# Patient Record
Sex: Male | Born: 1940
Health system: Southern US, Community
[De-identification: ages and names within clinical notes are randomized; demographics above are authoritative.]

## PROBLEM LIST (undated history)

## (undated) DIAGNOSIS — I4891 Unspecified atrial fibrillation: Secondary | ICD-10-CM

## (undated) DIAGNOSIS — E119 Type 2 diabetes mellitus without complications: Secondary | ICD-10-CM

## (undated) DIAGNOSIS — K219 Gastro-esophageal reflux disease without esophagitis: Secondary | ICD-10-CM

## (undated) DIAGNOSIS — E785 Hyperlipidemia, unspecified: Secondary | ICD-10-CM

## (undated) DIAGNOSIS — I639 Cerebral infarction, unspecified: Secondary | ICD-10-CM

## (undated) DIAGNOSIS — K279 Peptic ulcer, site unspecified, unspecified as acute or chronic, without hemorrhage or perforation: Secondary | ICD-10-CM

## (undated) HISTORY — DX: Unspecified atrial fibrillation: I48.91

## (undated) HISTORY — DX: Peptic ulcer, site unspecified, unspecified as acute or chronic, without hemorrhage or perforation: K27.9

## (undated) HISTORY — PX: CATARACT EXTRACTION W/ INTRAOCULAR LENS IMPLANT: SHX1309

## (undated) HISTORY — DX: Gastro-esophageal reflux disease without esophagitis: K21.9

## (undated) HISTORY — DX: Type 2 diabetes mellitus without complications: E11.9

## (undated) HISTORY — DX: Hyperlipidemia, unspecified: E78.5

## (undated) HISTORY — DX: Cerebral infarction, unspecified: I63.9

---

## 2007-01-07 DIAGNOSIS — M109 Gout, unspecified: Secondary | ICD-10-CM | POA: Insufficient documentation

## 2007-01-07 DIAGNOSIS — I10 Essential (primary) hypertension: Secondary | ICD-10-CM | POA: Insufficient documentation

## 2007-01-07 DIAGNOSIS — J309 Allergic rhinitis, unspecified: Secondary | ICD-10-CM | POA: Insufficient documentation

## 2007-07-19 DIAGNOSIS — G4730 Sleep apnea, unspecified: Secondary | ICD-10-CM | POA: Insufficient documentation

## 2008-05-20 DIAGNOSIS — E1169 Type 2 diabetes mellitus with other specified complication: Secondary | ICD-10-CM | POA: Insufficient documentation

## 2008-05-20 DIAGNOSIS — E785 Hyperlipidemia, unspecified: Secondary | ICD-10-CM | POA: Insufficient documentation

## 2008-08-14 DIAGNOSIS — E1165 Type 2 diabetes mellitus with hyperglycemia: Secondary | ICD-10-CM | POA: Insufficient documentation

## 2008-08-14 DIAGNOSIS — E118 Type 2 diabetes mellitus with unspecified complications: Secondary | ICD-10-CM | POA: Insufficient documentation

## 2008-08-14 DIAGNOSIS — IMO0002 Reserved for concepts with insufficient information to code with codable children: Secondary | ICD-10-CM | POA: Insufficient documentation

## 2016-05-04 DIAGNOSIS — Z0289 Encounter for other administrative examinations: Secondary | ICD-10-CM | POA: Diagnosis not present

## 2016-05-04 DIAGNOSIS — Z024 Encounter for examination for driving license: Secondary | ICD-10-CM | POA: Diagnosis not present

## 2016-07-08 DIAGNOSIS — I440 Atrioventricular block, first degree: Secondary | ICD-10-CM | POA: Diagnosis present

## 2016-07-08 DIAGNOSIS — I6789 Other cerebrovascular disease: Secondary | ICD-10-CM | POA: Diagnosis not present

## 2016-07-08 DIAGNOSIS — R4781 Slurred speech: Secondary | ICD-10-CM | POA: Diagnosis not present

## 2016-07-08 DIAGNOSIS — G4733 Obstructive sleep apnea (adult) (pediatric): Secondary | ICD-10-CM | POA: Diagnosis present

## 2016-07-08 DIAGNOSIS — G8191 Hemiplegia, unspecified affecting right dominant side: Secondary | ICD-10-CM | POA: Diagnosis not present

## 2016-07-08 DIAGNOSIS — I638 Other cerebral infarction: Secondary | ICD-10-CM | POA: Diagnosis not present

## 2016-07-08 DIAGNOSIS — Z8673 Personal history of transient ischemic attack (TIA), and cerebral infarction without residual deficits: Secondary | ICD-10-CM | POA: Diagnosis not present

## 2016-07-08 DIAGNOSIS — Z7984 Long term (current) use of oral hypoglycemic drugs: Secondary | ICD-10-CM | POA: Diagnosis not present

## 2016-07-08 DIAGNOSIS — I700 Atherosclerosis of aorta: Secondary | ICD-10-CM | POA: Diagnosis not present

## 2016-07-08 DIAGNOSIS — R2990 Unspecified symptoms and signs involving the nervous system: Secondary | ICD-10-CM | POA: Diagnosis not present

## 2016-07-08 DIAGNOSIS — I6322 Cerebral infarction due to unspecified occlusion or stenosis of basilar arteries: Secondary | ICD-10-CM | POA: Diagnosis not present

## 2016-07-08 DIAGNOSIS — H532 Diplopia: Secondary | ICD-10-CM | POA: Diagnosis present

## 2016-07-08 DIAGNOSIS — I63212 Cerebral infarction due to unspecified occlusion or stenosis of left vertebral arteries: Secondary | ICD-10-CM | POA: Diagnosis not present

## 2016-07-08 DIAGNOSIS — R29703 NIHSS score 3: Secondary | ICD-10-CM | POA: Diagnosis present

## 2016-07-08 DIAGNOSIS — R471 Dysarthria and anarthria: Secondary | ICD-10-CM | POA: Diagnosis present

## 2016-07-08 DIAGNOSIS — I513 Intracardiac thrombosis, not elsewhere classified: Secondary | ICD-10-CM | POA: Diagnosis not present

## 2016-07-08 DIAGNOSIS — I519 Heart disease, unspecified: Secondary | ICD-10-CM | POA: Diagnosis not present

## 2016-07-08 DIAGNOSIS — I6623 Occlusion and stenosis of bilateral posterior cerebral arteries: Secondary | ICD-10-CM | POA: Diagnosis not present

## 2016-07-08 DIAGNOSIS — H5347 Heteronymous bilateral field defects: Secondary | ICD-10-CM | POA: Diagnosis present

## 2016-07-08 DIAGNOSIS — H5122 Internuclear ophthalmoplegia, left eye: Secondary | ICD-10-CM | POA: Diagnosis not present

## 2016-07-08 DIAGNOSIS — I639 Cerebral infarction, unspecified: Secondary | ICD-10-CM | POA: Diagnosis not present

## 2016-07-08 DIAGNOSIS — I10 Essential (primary) hypertension: Secondary | ICD-10-CM | POA: Diagnosis not present

## 2016-07-08 DIAGNOSIS — G9389 Other specified disorders of brain: Secondary | ICD-10-CM | POA: Diagnosis not present

## 2016-07-08 DIAGNOSIS — H510 Palsy (spasm) of conjugate gaze: Secondary | ICD-10-CM | POA: Diagnosis present

## 2016-07-08 DIAGNOSIS — Z7982 Long term (current) use of aspirin: Secondary | ICD-10-CM | POA: Diagnosis not present

## 2016-07-08 DIAGNOSIS — N400 Enlarged prostate without lower urinary tract symptoms: Secondary | ICD-10-CM | POA: Diagnosis not present

## 2016-07-08 DIAGNOSIS — I6502 Occlusion and stenosis of left vertebral artery: Secondary | ICD-10-CM | POA: Diagnosis not present

## 2016-07-08 DIAGNOSIS — E119 Type 2 diabetes mellitus without complications: Secondary | ICD-10-CM | POA: Diagnosis not present

## 2016-07-08 DIAGNOSIS — I6612 Occlusion and stenosis of left anterior cerebral artery: Secondary | ICD-10-CM | POA: Diagnosis not present

## 2016-07-08 DIAGNOSIS — I6340 Cerebral infarction due to embolism of unspecified cerebral artery: Secondary | ICD-10-CM | POA: Diagnosis not present

## 2016-07-08 DIAGNOSIS — I2510 Atherosclerotic heart disease of native coronary artery without angina pectoris: Secondary | ICD-10-CM | POA: Diagnosis not present

## 2016-07-13 DIAGNOSIS — E1165 Type 2 diabetes mellitus with hyperglycemia: Secondary | ICD-10-CM | POA: Diagnosis not present

## 2016-07-13 DIAGNOSIS — G463 Brain stem stroke syndrome: Secondary | ICD-10-CM | POA: Diagnosis not present

## 2016-07-13 DIAGNOSIS — I2510 Atherosclerotic heart disease of native coronary artery without angina pectoris: Secondary | ICD-10-CM | POA: Diagnosis not present

## 2016-07-13 DIAGNOSIS — I639 Cerebral infarction, unspecified: Secondary | ICD-10-CM | POA: Diagnosis not present

## 2016-07-13 DIAGNOSIS — Z7901 Long term (current) use of anticoagulants: Secondary | ICD-10-CM | POA: Diagnosis not present

## 2016-07-13 DIAGNOSIS — Z79899 Other long term (current) drug therapy: Secondary | ICD-10-CM | POA: Diagnosis not present

## 2016-07-13 DIAGNOSIS — E785 Hyperlipidemia, unspecified: Secondary | ICD-10-CM | POA: Diagnosis not present

## 2016-07-15 DIAGNOSIS — Z7901 Long term (current) use of anticoagulants: Secondary | ICD-10-CM | POA: Diagnosis not present

## 2016-07-16 DIAGNOSIS — M1991 Primary osteoarthritis, unspecified site: Secondary | ICD-10-CM | POA: Diagnosis not present

## 2016-07-16 DIAGNOSIS — G4730 Sleep apnea, unspecified: Secondary | ICD-10-CM | POA: Diagnosis not present

## 2016-07-16 DIAGNOSIS — Z7901 Long term (current) use of anticoagulants: Secondary | ICD-10-CM | POA: Diagnosis not present

## 2016-07-16 DIAGNOSIS — G463 Brain stem stroke syndrome: Secondary | ICD-10-CM | POA: Diagnosis not present

## 2016-07-16 DIAGNOSIS — E785 Hyperlipidemia, unspecified: Secondary | ICD-10-CM | POA: Diagnosis not present

## 2016-07-16 DIAGNOSIS — R2681 Unsteadiness on feet: Secondary | ICD-10-CM | POA: Diagnosis not present

## 2016-07-16 DIAGNOSIS — I69391 Dysphagia following cerebral infarction: Secondary | ICD-10-CM | POA: Diagnosis not present

## 2016-07-16 DIAGNOSIS — I69951 Hemiplegia and hemiparesis following unspecified cerebrovascular disease affecting right dominant side: Secondary | ICD-10-CM | POA: Diagnosis not present

## 2016-07-16 DIAGNOSIS — N401 Enlarged prostate with lower urinary tract symptoms: Secondary | ICD-10-CM | POA: Diagnosis not present

## 2016-07-16 DIAGNOSIS — E1140 Type 2 diabetes mellitus with diabetic neuropathy, unspecified: Secondary | ICD-10-CM | POA: Diagnosis not present

## 2016-07-16 DIAGNOSIS — I69322 Dysarthria following cerebral infarction: Secondary | ICD-10-CM | POA: Diagnosis not present

## 2016-07-16 DIAGNOSIS — I69398 Other sequelae of cerebral infarction: Secondary | ICD-10-CM | POA: Diagnosis not present

## 2016-07-16 DIAGNOSIS — I2510 Atherosclerotic heart disease of native coronary artery without angina pectoris: Secondary | ICD-10-CM | POA: Diagnosis not present

## 2016-07-16 DIAGNOSIS — N138 Other obstructive and reflux uropathy: Secondary | ICD-10-CM | POA: Diagnosis not present

## 2016-07-16 DIAGNOSIS — H532 Diplopia: Secondary | ICD-10-CM | POA: Diagnosis not present

## 2016-07-16 DIAGNOSIS — Z7984 Long term (current) use of oral hypoglycemic drugs: Secondary | ICD-10-CM | POA: Diagnosis not present

## 2016-07-18 DIAGNOSIS — Z7901 Long term (current) use of anticoagulants: Secondary | ICD-10-CM | POA: Diagnosis not present

## 2016-07-19 DIAGNOSIS — I69322 Dysarthria following cerebral infarction: Secondary | ICD-10-CM | POA: Diagnosis not present

## 2016-07-19 DIAGNOSIS — H532 Diplopia: Secondary | ICD-10-CM | POA: Diagnosis not present

## 2016-07-19 DIAGNOSIS — R2681 Unsteadiness on feet: Secondary | ICD-10-CM | POA: Diagnosis not present

## 2016-07-19 DIAGNOSIS — E1140 Type 2 diabetes mellitus with diabetic neuropathy, unspecified: Secondary | ICD-10-CM | POA: Diagnosis not present

## 2016-07-19 DIAGNOSIS — G463 Brain stem stroke syndrome: Secondary | ICD-10-CM | POA: Diagnosis not present

## 2016-07-19 DIAGNOSIS — I69398 Other sequelae of cerebral infarction: Secondary | ICD-10-CM | POA: Diagnosis not present

## 2016-07-20 DIAGNOSIS — E1140 Type 2 diabetes mellitus with diabetic neuropathy, unspecified: Secondary | ICD-10-CM | POA: Diagnosis not present

## 2016-07-20 DIAGNOSIS — H532 Diplopia: Secondary | ICD-10-CM | POA: Diagnosis not present

## 2016-07-20 DIAGNOSIS — I69398 Other sequelae of cerebral infarction: Secondary | ICD-10-CM | POA: Diagnosis not present

## 2016-07-20 DIAGNOSIS — I69322 Dysarthria following cerebral infarction: Secondary | ICD-10-CM | POA: Diagnosis not present

## 2016-07-20 DIAGNOSIS — G463 Brain stem stroke syndrome: Secondary | ICD-10-CM | POA: Diagnosis not present

## 2016-07-20 DIAGNOSIS — R2681 Unsteadiness on feet: Secondary | ICD-10-CM | POA: Diagnosis not present

## 2016-07-22 DIAGNOSIS — R002 Palpitations: Secondary | ICD-10-CM | POA: Diagnosis not present

## 2016-07-22 DIAGNOSIS — G463 Brain stem stroke syndrome: Secondary | ICD-10-CM | POA: Diagnosis not present

## 2016-07-22 DIAGNOSIS — I639 Cerebral infarction, unspecified: Secondary | ICD-10-CM | POA: Diagnosis not present

## 2016-07-22 DIAGNOSIS — I69322 Dysarthria following cerebral infarction: Secondary | ICD-10-CM | POA: Diagnosis not present

## 2016-07-22 DIAGNOSIS — I69398 Other sequelae of cerebral infarction: Secondary | ICD-10-CM | POA: Diagnosis not present

## 2016-07-22 DIAGNOSIS — G4739 Other sleep apnea: Secondary | ICD-10-CM | POA: Diagnosis not present

## 2016-07-22 DIAGNOSIS — H532 Diplopia: Secondary | ICD-10-CM | POA: Diagnosis not present

## 2016-07-22 DIAGNOSIS — R2681 Unsteadiness on feet: Secondary | ICD-10-CM | POA: Diagnosis not present

## 2016-07-22 DIAGNOSIS — E1140 Type 2 diabetes mellitus with diabetic neuropathy, unspecified: Secondary | ICD-10-CM | POA: Diagnosis not present

## 2016-07-22 DIAGNOSIS — E784 Other hyperlipidemia: Secondary | ICD-10-CM | POA: Diagnosis not present

## 2016-07-23 DIAGNOSIS — E1140 Type 2 diabetes mellitus with diabetic neuropathy, unspecified: Secondary | ICD-10-CM | POA: Diagnosis not present

## 2016-07-23 DIAGNOSIS — R2681 Unsteadiness on feet: Secondary | ICD-10-CM | POA: Diagnosis not present

## 2016-07-23 DIAGNOSIS — G463 Brain stem stroke syndrome: Secondary | ICD-10-CM | POA: Diagnosis not present

## 2016-07-23 DIAGNOSIS — H532 Diplopia: Secondary | ICD-10-CM | POA: Diagnosis not present

## 2016-07-23 DIAGNOSIS — I69322 Dysarthria following cerebral infarction: Secondary | ICD-10-CM | POA: Diagnosis not present

## 2016-07-23 DIAGNOSIS — I69398 Other sequelae of cerebral infarction: Secondary | ICD-10-CM | POA: Diagnosis not present

## 2016-07-26 DIAGNOSIS — I639 Cerebral infarction, unspecified: Secondary | ICD-10-CM | POA: Diagnosis not present

## 2016-07-26 DIAGNOSIS — E785 Hyperlipidemia, unspecified: Secondary | ICD-10-CM | POA: Diagnosis not present

## 2016-07-26 DIAGNOSIS — I679 Cerebrovascular disease, unspecified: Secondary | ICD-10-CM | POA: Diagnosis not present

## 2016-07-27 DIAGNOSIS — G463 Brain stem stroke syndrome: Secondary | ICD-10-CM | POA: Diagnosis not present

## 2016-07-27 DIAGNOSIS — E1140 Type 2 diabetes mellitus with diabetic neuropathy, unspecified: Secondary | ICD-10-CM | POA: Diagnosis not present

## 2016-07-27 DIAGNOSIS — R2681 Unsteadiness on feet: Secondary | ICD-10-CM | POA: Diagnosis not present

## 2016-07-27 DIAGNOSIS — H532 Diplopia: Secondary | ICD-10-CM | POA: Diagnosis not present

## 2016-07-27 DIAGNOSIS — I69322 Dysarthria following cerebral infarction: Secondary | ICD-10-CM | POA: Diagnosis not present

## 2016-07-27 DIAGNOSIS — I69398 Other sequelae of cerebral infarction: Secondary | ICD-10-CM | POA: Diagnosis not present

## 2016-08-01 DIAGNOSIS — G4733 Obstructive sleep apnea (adult) (pediatric): Secondary | ICD-10-CM | POA: Diagnosis not present

## 2016-08-01 DIAGNOSIS — E785 Hyperlipidemia, unspecified: Secondary | ICD-10-CM | POA: Diagnosis not present

## 2016-08-01 DIAGNOSIS — E119 Type 2 diabetes mellitus without complications: Secondary | ICD-10-CM | POA: Diagnosis not present

## 2016-08-01 DIAGNOSIS — Z7901 Long term (current) use of anticoagulants: Secondary | ICD-10-CM | POA: Diagnosis not present

## 2016-08-01 DIAGNOSIS — Z79899 Other long term (current) drug therapy: Secondary | ICD-10-CM | POA: Diagnosis not present

## 2016-08-01 DIAGNOSIS — Z7984 Long term (current) use of oral hypoglycemic drugs: Secondary | ICD-10-CM | POA: Diagnosis not present

## 2016-08-01 DIAGNOSIS — E784 Other hyperlipidemia: Secondary | ICD-10-CM | POA: Diagnosis not present

## 2016-08-01 DIAGNOSIS — E1165 Type 2 diabetes mellitus with hyperglycemia: Secondary | ICD-10-CM | POA: Diagnosis not present

## 2016-08-02 DIAGNOSIS — G4733 Obstructive sleep apnea (adult) (pediatric): Secondary | ICD-10-CM | POA: Diagnosis not present

## 2016-08-02 DIAGNOSIS — I639 Cerebral infarction, unspecified: Secondary | ICD-10-CM | POA: Diagnosis not present

## 2016-08-04 DIAGNOSIS — G463 Brain stem stroke syndrome: Secondary | ICD-10-CM | POA: Diagnosis not present

## 2016-08-04 DIAGNOSIS — R2681 Unsteadiness on feet: Secondary | ICD-10-CM | POA: Diagnosis not present

## 2016-08-04 DIAGNOSIS — I69322 Dysarthria following cerebral infarction: Secondary | ICD-10-CM | POA: Diagnosis not present

## 2016-08-04 DIAGNOSIS — E1140 Type 2 diabetes mellitus with diabetic neuropathy, unspecified: Secondary | ICD-10-CM | POA: Diagnosis not present

## 2016-08-04 DIAGNOSIS — I69398 Other sequelae of cerebral infarction: Secondary | ICD-10-CM | POA: Diagnosis not present

## 2016-08-04 DIAGNOSIS — H532 Diplopia: Secondary | ICD-10-CM | POA: Diagnosis not present

## 2016-08-08 DIAGNOSIS — Z7901 Long term (current) use of anticoagulants: Secondary | ICD-10-CM | POA: Diagnosis not present

## 2016-08-08 DIAGNOSIS — R002 Palpitations: Secondary | ICD-10-CM | POA: Diagnosis not present

## 2016-08-10 DIAGNOSIS — E1140 Type 2 diabetes mellitus with diabetic neuropathy, unspecified: Secondary | ICD-10-CM | POA: Diagnosis not present

## 2016-08-10 DIAGNOSIS — R2681 Unsteadiness on feet: Secondary | ICD-10-CM | POA: Diagnosis not present

## 2016-08-10 DIAGNOSIS — H532 Diplopia: Secondary | ICD-10-CM | POA: Diagnosis not present

## 2016-08-10 DIAGNOSIS — I69322 Dysarthria following cerebral infarction: Secondary | ICD-10-CM | POA: Diagnosis not present

## 2016-08-10 DIAGNOSIS — I69398 Other sequelae of cerebral infarction: Secondary | ICD-10-CM | POA: Diagnosis not present

## 2016-08-10 DIAGNOSIS — G463 Brain stem stroke syndrome: Secondary | ICD-10-CM | POA: Diagnosis not present

## 2016-08-11 DIAGNOSIS — E784 Other hyperlipidemia: Secondary | ICD-10-CM | POA: Diagnosis not present

## 2016-08-11 DIAGNOSIS — I10 Essential (primary) hypertension: Secondary | ICD-10-CM | POA: Diagnosis not present

## 2016-08-11 DIAGNOSIS — I639 Cerebral infarction, unspecified: Secondary | ICD-10-CM | POA: Diagnosis not present

## 2016-08-11 DIAGNOSIS — G4739 Other sleep apnea: Secondary | ICD-10-CM | POA: Diagnosis not present

## 2016-08-12 DIAGNOSIS — R2681 Unsteadiness on feet: Secondary | ICD-10-CM | POA: Diagnosis not present

## 2016-08-12 DIAGNOSIS — G463 Brain stem stroke syndrome: Secondary | ICD-10-CM | POA: Diagnosis not present

## 2016-08-12 DIAGNOSIS — E1140 Type 2 diabetes mellitus with diabetic neuropathy, unspecified: Secondary | ICD-10-CM | POA: Diagnosis not present

## 2016-08-12 DIAGNOSIS — I69398 Other sequelae of cerebral infarction: Secondary | ICD-10-CM | POA: Diagnosis not present

## 2016-08-12 DIAGNOSIS — I69322 Dysarthria following cerebral infarction: Secondary | ICD-10-CM | POA: Diagnosis not present

## 2016-08-12 DIAGNOSIS — H532 Diplopia: Secondary | ICD-10-CM | POA: Diagnosis not present

## 2016-08-17 DIAGNOSIS — E1140 Type 2 diabetes mellitus with diabetic neuropathy, unspecified: Secondary | ICD-10-CM | POA: Diagnosis not present

## 2016-08-17 DIAGNOSIS — I69322 Dysarthria following cerebral infarction: Secondary | ICD-10-CM | POA: Diagnosis not present

## 2016-08-17 DIAGNOSIS — R2681 Unsteadiness on feet: Secondary | ICD-10-CM | POA: Diagnosis not present

## 2016-08-17 DIAGNOSIS — H532 Diplopia: Secondary | ICD-10-CM | POA: Diagnosis not present

## 2016-08-17 DIAGNOSIS — I69398 Other sequelae of cerebral infarction: Secondary | ICD-10-CM | POA: Diagnosis not present

## 2016-08-17 DIAGNOSIS — G463 Brain stem stroke syndrome: Secondary | ICD-10-CM | POA: Diagnosis not present

## 2016-08-23 DIAGNOSIS — Z7901 Long term (current) use of anticoagulants: Secondary | ICD-10-CM | POA: Diagnosis not present

## 2016-08-23 LAB — PROTIME-INR: INR: 2.4 — AB (ref 0.9–1.1)

## 2016-09-06 DIAGNOSIS — Z7901 Long term (current) use of anticoagulants: Secondary | ICD-10-CM | POA: Diagnosis not present

## 2016-09-06 LAB — PROTIME-INR: INR: 1.6 — AB (ref 0.9–1.1)

## 2016-09-07 DIAGNOSIS — Z8673 Personal history of transient ischemic attack (TIA), and cerebral infarction without residual deficits: Secondary | ICD-10-CM | POA: Diagnosis not present

## 2016-09-20 DIAGNOSIS — I480 Paroxysmal atrial fibrillation: Secondary | ICD-10-CM | POA: Diagnosis not present

## 2016-09-20 DIAGNOSIS — E784 Other hyperlipidemia: Secondary | ICD-10-CM | POA: Diagnosis not present

## 2016-09-20 DIAGNOSIS — I639 Cerebral infarction, unspecified: Secondary | ICD-10-CM | POA: Diagnosis not present

## 2016-09-20 DIAGNOSIS — Z7901 Long term (current) use of anticoagulants: Secondary | ICD-10-CM | POA: Diagnosis not present

## 2016-09-20 DIAGNOSIS — I10 Essential (primary) hypertension: Secondary | ICD-10-CM | POA: Diagnosis not present

## 2016-09-20 LAB — PROTIME-INR: INR: 2.4 — AB (ref 0.9–1.1)

## 2016-09-28 DIAGNOSIS — E119 Type 2 diabetes mellitus without complications: Secondary | ICD-10-CM | POA: Diagnosis not present

## 2016-09-28 DIAGNOSIS — I639 Cerebral infarction, unspecified: Secondary | ICD-10-CM | POA: Diagnosis not present

## 2016-09-28 DIAGNOSIS — E784 Other hyperlipidemia: Secondary | ICD-10-CM | POA: Diagnosis not present

## 2016-09-28 DIAGNOSIS — G4739 Other sleep apnea: Secondary | ICD-10-CM | POA: Diagnosis not present

## 2016-10-03 DIAGNOSIS — H53433 Sector or arcuate defects, bilateral: Secondary | ICD-10-CM | POA: Diagnosis not present

## 2016-10-03 DIAGNOSIS — E11319 Type 2 diabetes mellitus with unspecified diabetic retinopathy without macular edema: Secondary | ICD-10-CM | POA: Diagnosis not present

## 2016-10-03 DIAGNOSIS — H524 Presbyopia: Secondary | ICD-10-CM | POA: Diagnosis not present

## 2016-10-03 DIAGNOSIS — E119 Type 2 diabetes mellitus without complications: Secondary | ICD-10-CM | POA: Diagnosis not present

## 2016-10-05 DIAGNOSIS — Z7901 Long term (current) use of anticoagulants: Secondary | ICD-10-CM | POA: Diagnosis not present

## 2016-10-05 DIAGNOSIS — R4189 Other symptoms and signs involving cognitive functions and awareness: Secondary | ICD-10-CM | POA: Diagnosis not present

## 2016-10-05 DIAGNOSIS — I639 Cerebral infarction, unspecified: Secondary | ICD-10-CM | POA: Diagnosis not present

## 2016-10-11 DIAGNOSIS — G4733 Obstructive sleep apnea (adult) (pediatric): Secondary | ICD-10-CM | POA: Diagnosis not present

## 2016-10-11 DIAGNOSIS — E669 Obesity, unspecified: Secondary | ICD-10-CM | POA: Diagnosis not present

## 2016-10-21 DIAGNOSIS — Z7901 Long term (current) use of anticoagulants: Secondary | ICD-10-CM | POA: Diagnosis not present

## 2016-11-09 DIAGNOSIS — I10 Essential (primary) hypertension: Secondary | ICD-10-CM | POA: Diagnosis not present

## 2016-11-09 DIAGNOSIS — N400 Enlarged prostate without lower urinary tract symptoms: Secondary | ICD-10-CM | POA: Diagnosis not present

## 2016-11-09 DIAGNOSIS — Z8673 Personal history of transient ischemic attack (TIA), and cerebral infarction without residual deficits: Secondary | ICD-10-CM | POA: Diagnosis not present

## 2016-11-09 DIAGNOSIS — E782 Mixed hyperlipidemia: Secondary | ICD-10-CM | POA: Diagnosis not present

## 2016-11-09 DIAGNOSIS — E1140 Type 2 diabetes mellitus with diabetic neuropathy, unspecified: Secondary | ICD-10-CM | POA: Diagnosis not present

## 2016-11-09 DIAGNOSIS — G4709 Other insomnia: Secondary | ICD-10-CM | POA: Diagnosis not present

## 2016-11-09 DIAGNOSIS — K219 Gastro-esophageal reflux disease without esophagitis: Secondary | ICD-10-CM | POA: Diagnosis not present

## 2016-11-09 DIAGNOSIS — E1165 Type 2 diabetes mellitus with hyperglycemia: Secondary | ICD-10-CM | POA: Diagnosis not present

## 2016-11-09 DIAGNOSIS — E538 Deficiency of other specified B group vitamins: Secondary | ICD-10-CM | POA: Diagnosis not present

## 2016-11-09 DIAGNOSIS — Z7901 Long term (current) use of anticoagulants: Secondary | ICD-10-CM | POA: Diagnosis not present

## 2016-11-09 DIAGNOSIS — I481 Persistent atrial fibrillation: Secondary | ICD-10-CM | POA: Diagnosis not present

## 2016-11-09 DIAGNOSIS — R51 Headache: Secondary | ICD-10-CM | POA: Diagnosis not present

## 2016-11-17 DIAGNOSIS — Z7901 Long term (current) use of anticoagulants: Secondary | ICD-10-CM | POA: Diagnosis not present

## 2016-12-07 DIAGNOSIS — M129 Arthropathy, unspecified: Secondary | ICD-10-CM | POA: Diagnosis not present

## 2016-12-07 DIAGNOSIS — N400 Enlarged prostate without lower urinary tract symptoms: Secondary | ICD-10-CM | POA: Diagnosis not present

## 2016-12-07 DIAGNOSIS — K219 Gastro-esophageal reflux disease without esophagitis: Secondary | ICD-10-CM | POA: Diagnosis not present

## 2016-12-07 DIAGNOSIS — I10 Essential (primary) hypertension: Secondary | ICD-10-CM | POA: Diagnosis not present

## 2016-12-07 DIAGNOSIS — R51 Headache: Secondary | ICD-10-CM | POA: Diagnosis not present

## 2016-12-07 DIAGNOSIS — Z8673 Personal history of transient ischemic attack (TIA), and cerebral infarction without residual deficits: Secondary | ICD-10-CM | POA: Diagnosis not present

## 2016-12-07 DIAGNOSIS — E1142 Type 2 diabetes mellitus with diabetic polyneuropathy: Secondary | ICD-10-CM | POA: Diagnosis not present

## 2016-12-07 DIAGNOSIS — E782 Mixed hyperlipidemia: Secondary | ICD-10-CM | POA: Diagnosis not present

## 2016-12-07 DIAGNOSIS — I481 Persistent atrial fibrillation: Secondary | ICD-10-CM | POA: Diagnosis not present

## 2016-12-07 DIAGNOSIS — G4709 Other insomnia: Secondary | ICD-10-CM | POA: Diagnosis not present

## 2016-12-07 DIAGNOSIS — E118 Type 2 diabetes mellitus with unspecified complications: Secondary | ICD-10-CM | POA: Diagnosis not present

## 2016-12-07 DIAGNOSIS — Z7901 Long term (current) use of anticoagulants: Secondary | ICD-10-CM | POA: Diagnosis not present

## 2016-12-09 DIAGNOSIS — I513 Intracardiac thrombosis, not elsewhere classified: Secondary | ICD-10-CM | POA: Insufficient documentation

## 2016-12-09 DIAGNOSIS — I6322 Cerebral infarction due to unspecified occlusion or stenosis of basilar arteries: Secondary | ICD-10-CM

## 2016-12-09 DIAGNOSIS — I63212 Cerebral infarction due to unspecified occlusion or stenosis of left vertebral arteries: Secondary | ICD-10-CM | POA: Insufficient documentation

## 2016-12-12 DIAGNOSIS — I440 Atrioventricular block, first degree: Secondary | ICD-10-CM | POA: Diagnosis not present

## 2016-12-12 DIAGNOSIS — Z7901 Long term (current) use of anticoagulants: Secondary | ICD-10-CM | POA: Diagnosis not present

## 2016-12-12 DIAGNOSIS — I480 Paroxysmal atrial fibrillation: Secondary | ICD-10-CM | POA: Diagnosis not present

## 2016-12-12 DIAGNOSIS — I4510 Unspecified right bundle-branch block: Secondary | ICD-10-CM | POA: Diagnosis not present

## 2016-12-12 DIAGNOSIS — Z8673 Personal history of transient ischemic attack (TIA), and cerebral infarction without residual deficits: Secondary | ICD-10-CM | POA: Diagnosis not present

## 2016-12-12 DIAGNOSIS — I481 Persistent atrial fibrillation: Secondary | ICD-10-CM | POA: Diagnosis not present

## 2016-12-12 DIAGNOSIS — R5383 Other fatigue: Secondary | ICD-10-CM | POA: Diagnosis not present

## 2016-12-12 DIAGNOSIS — I482 Chronic atrial fibrillation: Secondary | ICD-10-CM | POA: Diagnosis not present

## 2016-12-26 DIAGNOSIS — Z7901 Long term (current) use of anticoagulants: Secondary | ICD-10-CM | POA: Diagnosis not present

## 2017-01-04 DIAGNOSIS — M7021 Olecranon bursitis, right elbow: Secondary | ICD-10-CM | POA: Diagnosis not present

## 2017-01-04 DIAGNOSIS — Z7901 Long term (current) use of anticoagulants: Secondary | ICD-10-CM | POA: Diagnosis not present

## 2017-01-04 DIAGNOSIS — S5001XA Contusion of right elbow, initial encounter: Secondary | ICD-10-CM | POA: Diagnosis not present

## 2017-01-09 DIAGNOSIS — Z7901 Long term (current) use of anticoagulants: Secondary | ICD-10-CM | POA: Diagnosis not present

## 2017-01-09 DIAGNOSIS — Z8673 Personal history of transient ischemic attack (TIA), and cerebral infarction without residual deficits: Secondary | ICD-10-CM | POA: Diagnosis not present

## 2017-01-09 DIAGNOSIS — I480 Paroxysmal atrial fibrillation: Secondary | ICD-10-CM | POA: Diagnosis not present

## 2017-01-09 DIAGNOSIS — E785 Hyperlipidemia, unspecified: Secondary | ICD-10-CM | POA: Diagnosis not present

## 2017-01-09 DIAGNOSIS — Z7984 Long term (current) use of oral hypoglycemic drugs: Secondary | ICD-10-CM | POA: Diagnosis not present

## 2017-01-09 DIAGNOSIS — E119 Type 2 diabetes mellitus without complications: Secondary | ICD-10-CM | POA: Diagnosis not present

## 2017-01-16 DIAGNOSIS — Z7901 Long term (current) use of anticoagulants: Secondary | ICD-10-CM | POA: Diagnosis not present

## 2017-02-06 DIAGNOSIS — E118 Type 2 diabetes mellitus with unspecified complications: Secondary | ICD-10-CM | POA: Diagnosis not present

## 2017-02-06 DIAGNOSIS — Z8673 Personal history of transient ischemic attack (TIA), and cerebral infarction without residual deficits: Secondary | ICD-10-CM | POA: Diagnosis not present

## 2017-02-06 DIAGNOSIS — I481 Persistent atrial fibrillation: Secondary | ICD-10-CM | POA: Diagnosis not present

## 2017-02-06 DIAGNOSIS — Z23 Encounter for immunization: Secondary | ICD-10-CM | POA: Diagnosis not present

## 2017-02-06 DIAGNOSIS — K219 Gastro-esophageal reflux disease without esophagitis: Secondary | ICD-10-CM | POA: Diagnosis not present

## 2017-02-06 DIAGNOSIS — I10 Essential (primary) hypertension: Secondary | ICD-10-CM | POA: Diagnosis not present

## 2017-02-06 DIAGNOSIS — E1165 Type 2 diabetes mellitus with hyperglycemia: Secondary | ICD-10-CM | POA: Diagnosis not present

## 2017-02-06 DIAGNOSIS — G4730 Sleep apnea, unspecified: Secondary | ICD-10-CM | POA: Diagnosis not present

## 2017-02-06 DIAGNOSIS — E782 Mixed hyperlipidemia: Secondary | ICD-10-CM | POA: Diagnosis not present

## 2017-02-06 DIAGNOSIS — M1A09X0 Idiopathic chronic gout, multiple sites, without tophus (tophi): Secondary | ICD-10-CM | POA: Diagnosis not present

## 2017-05-08 DIAGNOSIS — K219 Gastro-esophageal reflux disease without esophagitis: Secondary | ICD-10-CM | POA: Diagnosis not present

## 2017-05-08 DIAGNOSIS — G4730 Sleep apnea, unspecified: Secondary | ICD-10-CM | POA: Diagnosis not present

## 2017-05-08 DIAGNOSIS — M720 Palmar fascial fibromatosis [Dupuytren]: Secondary | ICD-10-CM | POA: Diagnosis not present

## 2017-05-08 DIAGNOSIS — M1A09X0 Idiopathic chronic gout, multiple sites, without tophus (tophi): Secondary | ICD-10-CM | POA: Diagnosis not present

## 2017-05-08 DIAGNOSIS — I639 Cerebral infarction, unspecified: Secondary | ICD-10-CM | POA: Diagnosis not present

## 2017-05-08 DIAGNOSIS — I10 Essential (primary) hypertension: Secondary | ICD-10-CM | POA: Diagnosis not present

## 2017-05-08 DIAGNOSIS — Z8673 Personal history of transient ischemic attack (TIA), and cerebral infarction without residual deficits: Secondary | ICD-10-CM | POA: Diagnosis not present

## 2017-05-08 DIAGNOSIS — E1165 Type 2 diabetes mellitus with hyperglycemia: Secondary | ICD-10-CM | POA: Diagnosis not present

## 2017-05-08 DIAGNOSIS — E118 Type 2 diabetes mellitus with unspecified complications: Secondary | ICD-10-CM | POA: Diagnosis not present

## 2017-05-08 DIAGNOSIS — I481 Persistent atrial fibrillation: Secondary | ICD-10-CM | POA: Diagnosis not present

## 2017-05-08 DIAGNOSIS — N400 Enlarged prostate without lower urinary tract symptoms: Secondary | ICD-10-CM | POA: Diagnosis not present

## 2017-05-08 DIAGNOSIS — E782 Mixed hyperlipidemia: Secondary | ICD-10-CM | POA: Diagnosis not present

## 2017-05-31 DIAGNOSIS — I69318 Other symptoms and signs involving cognitive functions following cerebral infarction: Secondary | ICD-10-CM | POA: Diagnosis not present

## 2017-05-31 DIAGNOSIS — E1140 Type 2 diabetes mellitus with diabetic neuropathy, unspecified: Secondary | ICD-10-CM | POA: Diagnosis not present

## 2017-05-31 DIAGNOSIS — I10 Essential (primary) hypertension: Secondary | ICD-10-CM | POA: Diagnosis not present

## 2017-05-31 DIAGNOSIS — E538 Deficiency of other specified B group vitamins: Secondary | ICD-10-CM | POA: Diagnosis not present

## 2017-05-31 DIAGNOSIS — I69322 Dysarthria following cerebral infarction: Secondary | ICD-10-CM | POA: Diagnosis not present

## 2017-05-31 DIAGNOSIS — E785 Hyperlipidemia, unspecified: Secondary | ICD-10-CM | POA: Diagnosis not present

## 2017-05-31 DIAGNOSIS — R454 Irritability and anger: Secondary | ICD-10-CM | POA: Diagnosis not present

## 2017-05-31 DIAGNOSIS — I69398 Other sequelae of cerebral infarction: Secondary | ICD-10-CM | POA: Diagnosis not present

## 2017-05-31 DIAGNOSIS — F0631 Mood disorder due to known physiological condition with depressive features: Secondary | ICD-10-CM | POA: Diagnosis not present

## 2017-05-31 DIAGNOSIS — G4733 Obstructive sleep apnea (adult) (pediatric): Secondary | ICD-10-CM | POA: Diagnosis not present

## 2017-05-31 DIAGNOSIS — Z7901 Long term (current) use of anticoagulants: Secondary | ICD-10-CM | POA: Diagnosis not present

## 2017-05-31 DIAGNOSIS — R2681 Unsteadiness on feet: Secondary | ICD-10-CM | POA: Diagnosis not present

## 2017-06-19 DIAGNOSIS — Z7901 Long term (current) use of anticoagulants: Secondary | ICD-10-CM | POA: Diagnosis not present

## 2017-06-19 DIAGNOSIS — Z8673 Personal history of transient ischemic attack (TIA), and cerebral infarction without residual deficits: Secondary | ICD-10-CM | POA: Diagnosis not present

## 2017-06-19 DIAGNOSIS — E119 Type 2 diabetes mellitus without complications: Secondary | ICD-10-CM | POA: Diagnosis not present

## 2017-06-19 DIAGNOSIS — E785 Hyperlipidemia, unspecified: Secondary | ICD-10-CM | POA: Diagnosis not present

## 2017-06-19 DIAGNOSIS — I480 Paroxysmal atrial fibrillation: Secondary | ICD-10-CM | POA: Diagnosis not present

## 2017-07-03 ENCOUNTER — Ambulatory Visit (INDEPENDENT_AMBULATORY_CARE_PROVIDER_SITE_OTHER): Payer: Medicare Other | Admitting: Family Medicine

## 2017-07-03 ENCOUNTER — Encounter: Payer: Self-pay | Admitting: Family Medicine

## 2017-07-03 VITALS — BP 130/52 | HR 68 | Ht 70.0 in | Wt 247.0 lb

## 2017-07-03 DIAGNOSIS — I10 Essential (primary) hypertension: Secondary | ICD-10-CM

## 2017-07-03 DIAGNOSIS — N529 Male erectile dysfunction, unspecified: Secondary | ICD-10-CM

## 2017-07-03 DIAGNOSIS — G4733 Obstructive sleep apnea (adult) (pediatric): Secondary | ICD-10-CM

## 2017-07-03 DIAGNOSIS — H9193 Unspecified hearing loss, bilateral: Secondary | ICD-10-CM

## 2017-07-03 DIAGNOSIS — H5122 Internuclear ophthalmoplegia, left eye: Secondary | ICD-10-CM | POA: Insufficient documentation

## 2017-07-03 DIAGNOSIS — I480 Paroxysmal atrial fibrillation: Secondary | ICD-10-CM

## 2017-07-03 DIAGNOSIS — H259 Unspecified age-related cataract: Secondary | ICD-10-CM

## 2017-07-03 DIAGNOSIS — R454 Irritability and anger: Secondary | ICD-10-CM | POA: Diagnosis not present

## 2017-07-03 DIAGNOSIS — H9190 Unspecified hearing loss, unspecified ear: Secondary | ICD-10-CM | POA: Insufficient documentation

## 2017-07-03 NOTE — Progress Notes (Signed)
Subjective:    Patient ID: Tristan Mayer, male    DOB: 24-Apr-1941, 77 y.o.   MRN: [id number]  HPI 77 year old male is here today to establish care.  His wife has been my patient for about a year.  They recently moved here from [place] area.  OSA -wears his mask daily.   He is set at 15 cm of water pressure.  He currently gets his supplies through [facility].    Diabetes-he says he is been a diabetic for little over 20 years.  He was on metformin for a very long time and then in the last couple of years glipizide and then p.o. cortisone have been added.  He says more recently his blood sugars have been increasing.  Over the last couple of weeks they have been running between 175 and 200 in the mornings.  He is somewhat fearful and going on insulin though he recently had an older brother who just went on insulin for diabetes as well.  He also had a stroke last year.  He says it has impacted his vision as well as his short-term memory.  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  He does not have a CDL license.  Hearing loss-he does wear hearing aids.  He has had his flu shot this year and is up-to-date on both of his pneumonia vaccinations.  Atrial fibrillation-he has been on Coumadin until this past year when he was placed on Eliquis.  He is been doing well on that and says he is actually had much less bruising.  He does need to get scheduled with a new cardiologist locally.  Bilateral knee osteoarthritis-he takes a supplement called vital reds for his knees.  He has been having more difficulty with constipation and belching.  Normally he would have a bowel movement twice a week and now he is having to strain with those bowel movements and just is having more flatulence in general.    He has an appointment with dermatology scheduled for next week.  Is also had erectile dysfunction since his stroke last year.  Is not sure if it is a medication  side effect or related to his stroke.  He is on Zoloft for mood and irritability.  He is been on it for about 6 months at this point.  It was started well after his stroke.  He was noted to have some relationship problems with his wife because of some increased irritability and he has been doing really well on the Zoloft since then.  Review of Systems  Constitutional: Negative for diaphoresis, fever and unexpected weight change.  HENT: Negative for hearing loss, rhinorrhea, sneezing and tinnitus.   Eyes: Positive for visual disturbance.  Respiratory: Negative for cough and wheezing.   Cardiovascular: Negative for chest pain and palpitations.  Gastrointestinal: Negative for blood in stool, diarrhea, nausea and vomiting.  Genitourinary: Negative for discharge and dysuria.       + Nocturia, sexual dysfunction  Musculoskeletal: Negative for arthralgias and myalgias.  Skin: Negative for rash.       Mole on forehead  Neurological: Negative for headaches.       +memory loss  Hematological: Negative for adenopathy. Bruises/bleeds easily.  Psychiatric/Behavioral: Negative for dysphoric mood and sleep disturbance. The patient is not nervous/anxious.      BP (!) 130/52   Pulse 68   Ht 5\' 10"  (1.778 m)   Wt 247 lb (112 kg)  SpO2 98%   BMI 35.44 kg/m     Allergies  Allergen Reactions  . Statins Other (See Comments)    Other reaction(s): Other All statin drugs Leg cramps when he was on atorvastatin and fenofibrate simultaneously. Discussed with his PCP Dr. Remus Blake on the phone [phone]) All statin drugs Leg cramps when he was on atorvastatin and fenofibrate simultaneously. Discussed with his PCP Dr. Remus Blake on the phone [phone]) All statin drugs     No past medical history on file.  Past Surgical History:  Procedure Laterality Date  . CATARACT EXTRACTION W/ INTRAOCULAR LENS IMPLANT Right     Social History   Socioeconomic History  . Marital status: Married    Spouse  name: [name]  . Number of children: 3  . Years of education: Not on file  . Highest education level: 12th grade  Social Needs  . Financial resource strain: Not on file  . Food insecurity - worry: Not on file  . Food insecurity - inability: Not on file  . Transportation needs - medical: Not on file  . Transportation needs - non-medical: Not on file  Occupational History  . Occupation: part time driver     Comment: Therapist, occupational and [employer]   Tobacco Use  . Smoking status: Not on file  Substance and Sexual Activity  . Alcohol use: Not on file  . Drug use: Not on file  . Sexual activity: Not on file  Other Topics Concern  . Not on file  Social History Narrative  . Not on file    Family History  Problem Relation Age of Onset  . Prostate cancer Father 46  . Stroke Father   . Diabetes Brother   . Diabetes Brother     Outpatient Encounter Medications as of 07/03/2017  Medication Sig  . AMBULATORY NON FORMULARY MEDICATION Take 1 Scoop by mouth daily. Medication Name: Vital Reds 3.7grms daily  . Co-Enzyme Q-10 30 MG CAPS Take 400 mg by mouth daily.  Marland Kitchen diltiazem (CARDIZEM CD) 120 MG 24 hr capsule Take by mouth.  . ECHINACEA PO Take 760 mg by mouth as needed.  Marland Kitchen ELIQUIS 5 MG TABS tablet Take 1 tablet by mouth 2 (two) times daily.  . famotidine (PEPCID) 20 MG tablet Take 20 mg by mouth daily.  Marland Kitchen FARXIGA 10 MG TABS tablet Take 10 mg by mouth daily.  Marland Kitchen gabapentin (NEURONTIN) 300 MG capsule Take 1 capsule by mouth 3 (three) times daily.  Marland Kitchen glipiZIDE (GLUCOTROL) 10 MG tablet Take 10 mg by mouth 2 (two) times daily before a meal.  . Melatonin 10 MG TABS Take 1 tablet by mouth daily.  . metFORMIN (GLUCOPHAGE) 1000 MG tablet Take 1,000 mg by mouth 2 (two) times daily.  . pioglitazone (ACTOS) 30 MG tablet Take 30 mg by mouth daily.  . Potassium 99 MG TABS Take 5 tablets by mouth daily.  . rosuvastatin (CRESTOR) 40 MG tablet Take 40 mg by mouth daily.  . sertraline (ZOLOFT) 100 MG tablet Take 100  mg by mouth daily.  . tamsulosin (FLOMAX) 0.4 MG CAPS capsule Take 1 capsule by mouth 2 (two) times daily.   No facility-administered encounter medications on file as of 07/03/2017.           Objective:   Physical Exam  Constitutional: He is oriented to person, place, and time. He appears well-developed and well-nourished.  HENT:  Head: Normocephalic and atraumatic.  Right Ear: External ear normal.  Left Ear: External ear normal.  Nose: Nose normal.  Mouth/Throat: Oropharynx is clear and moist.  Eyes: Conjunctivae and EOM are normal. Pupils are equal, round, and reactive to light. Right eye exhibits no discharge. Left eye exhibits no discharge.  Neck: Normal range of motion. Neck supple. No thyromegaly present.  Cardiovascular: Normal rate, regular rhythm, normal heart sounds and intact distal pulses.  Pulmonary/Chest: Effort normal and breath sounds normal.  Abdominal: Soft. Bowel sounds are normal. He exhibits no distension and no mass. There is no tenderness. There is no rebound and no guarding.  Musculoskeletal: Normal range of motion. He exhibits no edema.  Lymphadenopathy:    He has no cervical adenopathy.  Neurological: He is alert and oriented to person, place, and time. He has normal reflexes.  Skin: Skin is warm and dry.  Psychiatric: He has a normal mood and affect. His behavior is normal. Judgment and thought content normal.          Assessment & Plan:  Slow transit constipation-recommend starting MiraLAX 1 capful daily.  If after couple of weeks stools become more loose he can decrease down to half a capful.  But this should improve regularity and difficulty with straining.  Diabetes-last hemoglobin A1c per his report was right at 7.5.  Though it sounds like his blood sugars have been climbing.  We discussed some dietary measures.  He feels like he is actually been trying to improve his diet but his sugars have actually gone up.  I suspect after 20 years he is  probably losing some pancreatic function and will need to transition to insulin sooner rather than later.  We had a long discussion about this today.  He still very hesitant but is willing to try an injectable as long as it is not insulin.  I think this will be a good replacement for either his glipizide or his p.o. glitazone.  But will continue the [place].  In fact we can combine the [place] and the metformin and when I see him back switching to [place].  He had just filled a 90-day supply for his medications and so did not want to make the change today.  The  Bilateral knee Osteoarthritis-currently using an over-the-counter supplement.    Atrial fibrillation-currently on Eliquis.  We will try to get him in with Dr. Kirk Ruths no urgency to the appointment just routine follow-up.    Cataract-needs referral to local eye doctor will place referral today.  Erectile dysfunction-I do not think it is likely to be a medication side effect.  Is not on a beta-blocker.  He is on Zoloft but that was started several months after his stroke and he was Artie having difficulty at that time.  We could consider a trial of Viagra or Cialis.  Mood/irritability-discussed options.  I think he should stay on the Zoloft for 4-year which would be until at least the summer or August.  At that point we can try weaning the medication down and see if he does well.

## 2017-07-03 NOTE — Patient Instructions (Addendum)
Can try miralax - 1 capful mixed with 8 oz of water at bedtime for several week.  If stools are getting too loose then decrease to half a capful.

## 2017-07-10 ENCOUNTER — Encounter: Payer: Self-pay | Admitting: Family Medicine

## 2017-07-10 DIAGNOSIS — N400 Enlarged prostate without lower urinary tract symptoms: Secondary | ICD-10-CM | POA: Insufficient documentation

## 2017-07-10 LAB — HEMOGLOBIN A1C: A1C: 8.8

## 2017-07-28 ENCOUNTER — Telehealth: Payer: Self-pay

## 2017-07-28 ENCOUNTER — Other Ambulatory Visit: Payer: Self-pay

## 2017-07-28 MED ORDER — NYSTATIN 100000 UNIT/GM EX POWD: Freq: Four times a day (QID) | CUTANEOUS | 1 refills | Status: DC

## 2017-07-28 MED ORDER — FLUCONAZOLE 150 MG PO TABS: 150.0000 mg | ORAL_TABLET | Freq: Once | ORAL | 1 refills | Status: DC

## 2017-07-28 MED ORDER — TAMSULOSIN HCL 0.4 MG PO CAPS: 0.4000 mg | ORAL_CAPSULE | Freq: Two times a day (BID) | ORAL | 3 refills | Status: DC

## 2017-07-28 NOTE — Telephone Encounter (Signed)
Rx sent 

## 2017-07-28 NOTE — Telephone Encounter (Signed)
Ok to fill for 90 with 3RF

## 2017-07-28 NOTE — Telephone Encounter (Signed)
[facility] sent request for Flomax 0.4mg , 1 BID.  It does not appear that this has been refilled by you recently..  Please advise on refill.  Thanks!

## 2017-07-28 NOTE — Telephone Encounter (Signed)
Medication ordered but no pharmacy on file.  Please send.  If not healing then needs to make an appointment for rash next week.

## 2017-07-28 NOTE — Telephone Encounter (Signed)
Spoke with Pt, [facility] rx sent.

## 2017-07-28 NOTE — Telephone Encounter (Signed)
Tristan Mayer reports he has itching on his penis. He did take OTC AZO and Monistat without resolve of the itching. He states he does get yeast infections from his wife. He would like something sent in. Please advise.

## 2017-08-14 ENCOUNTER — Telehealth: Payer: Self-pay | Admitting: *Deleted

## 2017-08-14 ENCOUNTER — Encounter: Payer: Self-pay | Admitting: Family Medicine

## 2017-08-14 ENCOUNTER — Ambulatory Visit (INDEPENDENT_AMBULATORY_CARE_PROVIDER_SITE_OTHER): Payer: Medicare Other | Admitting: Family Medicine

## 2017-08-14 VITALS — BP 127/62 | HR 65 | Ht 70.0 in | Wt 248.0 lb

## 2017-08-14 DIAGNOSIS — I10 Essential (primary) hypertension: Secondary | ICD-10-CM | POA: Diagnosis not present

## 2017-08-14 DIAGNOSIS — E118 Type 2 diabetes mellitus with unspecified complications: Secondary | ICD-10-CM

## 2017-08-14 DIAGNOSIS — I480 Paroxysmal atrial fibrillation: Secondary | ICD-10-CM

## 2017-08-14 DIAGNOSIS — IMO0002 Reserved for concepts with insufficient information to code with codable children: Secondary | ICD-10-CM

## 2017-08-14 DIAGNOSIS — E1165 Type 2 diabetes mellitus with hyperglycemia: Secondary | ICD-10-CM | POA: Diagnosis not present

## 2017-08-14 LAB — POCT UA - MICROALBUMIN
Creatinine, POC: 100 mg/dL
Microalbumin Ur, POC: 80 mg/L

## 2017-08-14 LAB — POCT GLYCOSYLATED HEMOGLOBIN (HGB A1C): HEMOGLOBIN A1C: 8.7

## 2017-08-14 MED ORDER — SILDENAFIL CITRATE 20 MG PO TABS: 60.0000 mg | ORAL_TABLET | Freq: Every day | ORAL | 99 refills | Status: DC | PRN

## 2017-08-14 MED ORDER — DULAGLUTIDE 0.75 MG/0.5ML ~~LOC~~ SOAJ: 0.7500 mg | SUBCUTANEOUS | 3 refills | Status: DC

## 2017-08-14 MED ORDER — DAPAGLIFLOZIN PRO-METFORMIN ER 10-1000 MG PO TB24: 1.0000 | ORAL_TABLET | Freq: Every day | ORAL | 6 refills | Status: DC

## 2017-08-14 NOTE — Telephone Encounter (Addendum)
Spoke w/[name]. Was informed that this medication has never been filled at this pharmacy.Tristan Mayer, [place]

## 2017-08-14 NOTE — Progress Notes (Signed)
Subjective:    CC: DM, HTN  HPI: Diabetes - no hypoglycemic events. No wounds or sores that are not healing well. No increased thirst or urination. Checking glucose at home. Taking medications as prescribed without any side effects.  Hypertension- Pt denies chest pain, SOB, dizziness, or heart palpitations.  Taking meds as directed w/o problems.  Denies medication side effects.    Paroxysmal A. fib-doing well on his Eliquis.  Still gets some easy bruising.  He has not had any chest pain or shortness of breath.  Past medical history, Surgical history, Family history not pertinant except as noted below, Social history, Allergies, and medications have been entered into the medical record, reviewed, and corrections made.   Review of Systems: No fevers, chills, night sweats, weight loss, chest pain, or shortness of breath.   Objective:    General: Well Developed, well nourished, and in no acute distress.  Neuro: Alert and oriented x3, extra-ocular muscles intact, sensation grossly intact.  HEENT: Normocephalic, atraumatic  Skin: Warm and dry, no rashes. Cardiac: Regular rate and rhythm, no murmurs rubs or gallops, no lower extremity edema.  Respiratory: Clear to auscultation bilaterally. Not using accessory muscles, speaking in full sentences.   Impression and Recommendations:    DM - Uncontrolled.  He really wants to avoid insulin but is willing to try an injection.  We will combine his Wilder Glade and metformin into Xigduo.  Will discontinue Actos and put him on Trulicity.  I feel like if he could lose a little bit of weight it would make a big difference.  He actually plans on starting a diet with his wife.  A1C of 8.7. Eye exam schedulded for tomorrow.   HTN - .Well controlled. Continue current regimen. Follow up in  3 months.    Paroxysmal atrial fibrillation-doing well on Eliquis.  Ointment scheduled in May with cardiology.

## 2017-08-14 NOTE — Telephone Encounter (Signed)
Erectile dysfunction-send her prescription for low-dose sildenafil to take anywhere between 3-5 tabs daily as needed.

## 2017-08-14 NOTE — Telephone Encounter (Signed)
Spoke w/pt and advised him that I called his pharmacy and they don't have a record of him filling this prescription. He stated that Dr. Gwynneth Munson prescribed Viagra 100 mg. Pt is asking for sildenafil  Prescription or whatever would be best I told that I would fwd to pcp for review and send to his local pharmacy. Maryruth Eve, [place], CMA

## 2017-08-14 NOTE — Patient Instructions (Signed)
I am combining your Wilder Glade and metformin into a pill called Xigduo.  If the same 2 medications is just all-in-one pill and it should be the same co-pay as the [place] by itself. Discontinue the Actos (pioglitizone)  and we will do the Trulicity injection once a week and its place.

## 2017-08-25 ENCOUNTER — Other Ambulatory Visit: Payer: Self-pay | Admitting: Family Medicine

## 2017-09-08 ENCOUNTER — Telehealth: Payer: Self-pay | Admitting: Family Medicine

## 2017-09-08 NOTE — Telephone Encounter (Signed)
Pt went to get the Trulicity Rx and it was $191. Questions if there is another option.

## 2017-09-11 NOTE — Telephone Encounter (Signed)
Please have him call his insurance to see what other options besides Trulicity are covered by his plan.  There are several other in that same class of medications so it could be that they will cover 1 of them with you.  Right now believe that therefore available in that class.  He should be able to call the one 800-number and given the name of the Trulicity and asked them what the recommend that alternatives are.

## 2017-09-11 NOTE — Telephone Encounter (Signed)
Tristan Mayer advised to [employer]. He will call back with the information.

## 2017-09-12 ENCOUNTER — Telehealth: Payer: Self-pay

## 2017-09-12 NOTE — Progress Notes (Signed)
Referring-Catherine Metheney MD Reason for referral-hypertension and paroxysmal atrial fibrillation.  HPI: 77 year old male for evaluation of hypertension and paroxysmal atrial fibrillation at request of Beatrice Lecher MD.  Previously followed by Dr. Cherlynn Perches.  Patient had CVA March 2018 and was treated at [facility].  Cardiac CTA March 2018 showed ejection fraction 49%.  There was moderate stenosis in the LAD, moderate stenosis in the ramus and moderate stenosis in the right coronary artery.  Echocardiogram showed normal LV function.  Cardiac MRI May 2018 showed normal LV function, no significant valvular abnormalities.  Patient presents to establish.  He denies dyspnea on exertion, orthopnea, PND, pedal edema, chest pain, palpitations or syncope.  Current Outpatient Medications  Medication Sig Dispense Refill  . AMBULATORY NON FORMULARY MEDICATION Take 1 Scoop by mouth daily. Medication Name: Vital Reds 3.7grms daily    . Co-Enzyme Q-10 30 MG CAPS Take 400 mg by mouth daily.    Marland Kitchen diltiazem (CARDIZEM CD) 120 MG 24 hr capsule Take by mouth.    . ECHINACEA PO Take 760 mg by mouth as needed.    Marland Kitchen ELIQUIS 5 MG TABS tablet Take 1 tablet by mouth 2 (two) times daily.  0  . famotidine (PEPCID) 20 MG tablet Take 20 mg by mouth daily.    Marland Kitchen gabapentin (NEURONTIN) 300 MG capsule Take 1 capsule by mouth 3 (three) times daily.  5  . glipiZIDE (GLUCOTROL) 10 MG tablet Take 10 mg by mouth 2 (two) times daily before a meal.  2  . Melatonin 10 MG TABS Take 1 tablet by mouth daily.    . metFORMIN (GLUCOPHAGE) 1000 MG tablet Take 1 tablet (1,000 mg total) by mouth 2 (two) times daily with a meal. 60 tablet 1  . nystatin (MYCOSTATIN/NYSTOP) powder Apply topically 4 (four) times daily. 60 g 1  . pioglitazone (ACTOS) 30 MG tablet Take 1 tablet (30 mg total) by mouth daily. 30 tablet 1  . Potassium 99 MG TABS Take 5 tablets by mouth daily.    . rosuvastatin (CRESTOR)  40 MG tablet Take 40 mg by mouth daily.  1  . sertraline (ZOLOFT) 100 MG tablet Take 100 mg by mouth daily.  11  . sildenafil (REVATIO) 20 MG tablet Take 3-5 tablets (60-100 mg total) by mouth daily as needed. 80 tablet PRN  . tamsulosin (FLOMAX) 0.4 MG CAPS capsule Take 1 capsule (0.4 mg total) by mouth 2 (two) times daily. 180 capsule 3   No current facility-administered medications for this visit.     Allergies  Allergen Reactions  . Statins Other (See Comments)    Other reaction(s): Other All statin drugs Leg cramps when he was on atorvastatin and fenofibrate simultaneously. Discussed with his PCP Dr. Remus Blake on the phone [phone]) All statin drugs Leg cramps when he was on atorvastatin and fenofibrate simultaneously. Discussed with his PCP Dr. Remus Blake on the phone [phone]) All statin drugs      Past Medical History:  Diagnosis Date  . Atrial fibrillation ([place])   . CVA (cerebral vascular accident) ([place])   . Diabetes mellitus ([place])   . GERD (gastroesophageal reflux disease)   . Hyperlipidemia   . PUD (peptic ulcer disease)     Past Surgical History:  Procedure Laterality Date  . CATARACT EXTRACTION W/ INTRAOCULAR LENS IMPLANT Right     Social History   Socioeconomic History  . Marital status: Married    Spouse name: [name]  . Number of children: 3  .  Years of education: Not on file  . Highest education level: 12th grade  Occupational History  . Occupation: part time driver     Comment: [employer]  . Financial resource strain: Not on file  . Food insecurity:    Worry: Not on file    Inability: Not on file  . Transportation needs:    Medical: Not on file    Non-medical: Not on file  Tobacco Use  . Smoking status: Current Some Day Smoker    Types: Cigars  . Smokeless tobacco: Never Used  Substance and Sexual Activity  . Alcohol use: Yes    Comment: Occasional  . Drug use: Not on file  . Sexual activity: Not on file    Lifestyle  . Physical activity:    Days per week: Not on file    Minutes per session: Not on file  . Stress: Not on file  Relationships  . Social connections:    Talks on phone: Not on file    Gets together: Not on file    Attends religious service: Not on file    Active member of club or organization: Not on file    Attends meetings of clubs or organizations: Not on file    Relationship status: Not on file  . Intimate partner violence:    Fear of current or ex partner: Not on file    Emotionally abused: Not on file    Physically abused: Not on file    Forced sexual activity: Not on file  Other Topics Concern  . Not on file  Social History Narrative  . Not on file    Family History  Problem Relation Age of Onset  . Prostate cancer Father 37  . Stroke Father   . Diabetes Brother   . Diabetes Brother   . Atrial fibrillation Mother     ROS: Knee arthralgias but no fevers or chills, productive cough, hemoptysis, dysphasia, odynophagia, melena, hematochezia, dysuria, hematuria, rash, seizure activity, orthopnea, PND, pedal edema, claudication. Remaining systems are negative.  Physical Exam:   Blood pressure 138/69, pulse (!) 59, height 5\' 10"  (1.778 m), weight 244 lb 1.9 oz (110.7 kg).  General:  Well developed/obese in NAD Skin warm/dry Patient not depressed No peripheral clubbing Back-normal HEENT-normal/normal eyelids Neck supple/normal carotid upstroke bilaterally; no bruits; no JVD; no thyromegaly chest - CTA/ normal expansion CV - RRR/normal S1 and S2; no murmurs, rubs or gallops;  PMI nondisplaced Abdomen -NT/ND, no HSM, no mass, + bowel sounds, positive bruit, ventral hernia noted. 2+ femoral pulses, no bruits Ext-trace edema, no chords, 2+ DP Neuro-grossly nonfocal  ECG -sinus bradycardia, right bundle branch block.  Personally reviewed  A/P  1 paroxysmal atrial fibrillation-patient is in sinus rhythm today.  Continue Cardizem for rate control if atrial  fibrillation recurs.  Continue apixaban.  Check hemoglobin and renal function.  2 coronary artery disease-moderate on prior CTA.  Continue statin.  No aspirin given need for anticoagulation.  3 bruit-schedule abdominal ultrasound to exclude aneurysm.  4 hyperlipidemia-continue statin.  Kirk Ruths, MD

## 2017-09-12 NOTE — Telephone Encounter (Signed)
Routing to covering provider for review.

## 2017-09-12 NOTE — Telephone Encounter (Signed)
PT states medication is too expensive. He would like something else sent in. He tried to call Medicare but it takes too long on hold to get a hold of them and he would like [place] to send in a different script for Xigduo and Trulicity. Please call pt if you have any questions.

## 2017-09-12 NOTE — Telephone Encounter (Signed)
What medicine are we talking about?

## 2017-09-13 NOTE — Telephone Encounter (Signed)
Trulicity. There is a separate phone note regarding this as well.

## 2017-09-13 NOTE — Telephone Encounter (Signed)
I will defer chronic medication management to PCP for this issue as it is not acute at this time. Both Xigduo and Trulicity and equivalent replacements will be very expensive with Medicare. Please inform patient.

## 2017-09-13 NOTE — Telephone Encounter (Signed)
Spoke with Pt, he is OK to wait until next week and see what PCP recommends. He does not have any Rx's to take in the meantime. Questioned if Rx's could be sent in for what he was on - Actos and Metform (seperately). Will route.

## 2017-09-14 MED ORDER — METFORMIN HCL 1000 MG PO TABS: 1000.0000 mg | ORAL_TABLET | Freq: Two times a day (BID) | ORAL | 1 refills | Status: DC

## 2017-09-14 MED ORDER — PIOGLITAZONE HCL 30 MG PO TABS: 30.0000 mg | ORAL_TABLET | Freq: Every day | ORAL | 1 refills | Status: DC

## 2017-09-14 NOTE — Telephone Encounter (Signed)
I called and spoke with Cecilie Lowers.  Due to med costs will go back to Metformin and Actos.  Continue Glipizide.  Will defer chronic medicine management to Dr Madilyn Fireman when she returns.

## 2017-09-14 NOTE — Addendum Note (Signed)
Addended by: Gregor Hams on: 09/14/2017 08:12 AM   Modules accepted: Orders

## 2017-09-20 ENCOUNTER — Ambulatory Visit (HOSPITAL_BASED_OUTPATIENT_CLINIC_OR_DEPARTMENT_OTHER): Payer: Medicare Other

## 2017-09-20 ENCOUNTER — Encounter: Payer: Self-pay | Admitting: Cardiology

## 2017-09-20 ENCOUNTER — Ambulatory Visit (INDEPENDENT_AMBULATORY_CARE_PROVIDER_SITE_OTHER): Payer: Medicare Other | Admitting: Cardiology

## 2017-09-20 VITALS — BP 138/69 | HR 59 | Ht 70.0 in | Wt 244.1 lb

## 2017-09-20 DIAGNOSIS — Z87891 Personal history of nicotine dependence: Secondary | ICD-10-CM

## 2017-09-20 DIAGNOSIS — I2510 Atherosclerotic heart disease of native coronary artery without angina pectoris: Secondary | ICD-10-CM | POA: Diagnosis not present

## 2017-09-20 DIAGNOSIS — I480 Paroxysmal atrial fibrillation: Secondary | ICD-10-CM | POA: Diagnosis not present

## 2017-09-20 DIAGNOSIS — R0989 Other specified symptoms and signs involving the circulatory and respiratory systems: Secondary | ICD-10-CM | POA: Diagnosis not present

## 2017-09-20 DIAGNOSIS — E7800 Pure hypercholesterolemia, unspecified: Secondary | ICD-10-CM | POA: Diagnosis not present

## 2017-09-20 NOTE — Patient Instructions (Signed)
Medication Instructions:   NO CHANGE  Labwork:  Your physician recommends that you HAVE LAB WORK TODAY  Testing/Procedures:  Your physician has requested that you have an abdominal aorta duplex. During this test, an ultrasound is used to evaluate the aorta. Allow 30 minutes for this exam. Do not eat after midnight the day before and avoid carbonated beverages   Follow-Up:  Your physician wants you to follow-up in: [place] will receive a reminder letter in the mail two months in advance. If you don't receive a letter, please call our office to schedule the follow-up appointment.

## 2017-09-21 LAB — BASIC METABOLIC PANEL
BUN / CREAT RATIO: 16 (calc) (ref 6–22)
BUN: 20 mg/dL (ref 7–25)
CHLORIDE: 102 mmol/L (ref 98–110)
CO2: 27 mmol/L (ref 20–32)
CREATININE: 1.27 mg/dL — AB (ref 0.70–1.18)
Calcium: 9.4 mg/dL (ref 8.6–10.3)
Glucose, Bld: 223 mg/dL — ABNORMAL HIGH (ref 65–99)
POTASSIUM: 4.3 mmol/L (ref 3.5–5.3)
Sodium: 136 mmol/L (ref 135–146)

## 2017-09-21 LAB — CBC
HEMATOCRIT: 40.9 % (ref 38.5–50.0)
HEMOGLOBIN: 14.1 g/dL (ref 13.2–17.1)
MCH: 29.6 pg (ref 27.0–33.0)
MCHC: 34.5 g/dL (ref 32.0–36.0)
MCV: 85.9 fL (ref 80.0–100.0)
MPV: 12.4 fL (ref 7.5–12.5)
Platelets: 179 10*3/uL (ref 140–400)
RBC: 4.76 10*6/uL (ref 4.20–5.80)
RDW: 13.2 % (ref 11.0–15.0)
WBC: 6.6 10*3/uL (ref 3.8–10.8)

## 2017-09-26 ENCOUNTER — Ambulatory Visit (HOSPITAL_BASED_OUTPATIENT_CLINIC_OR_DEPARTMENT_OTHER)
Admission: RE | Admit: 2017-09-26 | Discharge: 2017-09-26 | Disposition: A | Discharge status: Home / Self Care | Payer: Medicare Other | Source: Ambulatory Visit | Attending: Cardiology | Admitting: Cardiology

## 2017-09-26 DIAGNOSIS — Z87891 Personal history of nicotine dependence: Secondary | ICD-10-CM | POA: Diagnosis not present

## 2017-09-26 DIAGNOSIS — R0989 Other specified symptoms and signs involving the circulatory and respiratory systems: Secondary | ICD-10-CM | POA: Diagnosis not present

## 2017-09-26 DIAGNOSIS — I700 Atherosclerosis of aorta: Secondary | ICD-10-CM | POA: Insufficient documentation

## 2017-09-26 DIAGNOSIS — Z136 Encounter for screening for cardiovascular disorders: Secondary | ICD-10-CM | POA: Diagnosis not present

## 2017-10-03 DIAGNOSIS — D3610 Benign neoplasm of peripheral nerves and autonomic nervous system, unspecified: Secondary | ICD-10-CM | POA: Diagnosis not present

## 2017-10-03 DIAGNOSIS — L570 Actinic keratosis: Secondary | ICD-10-CM | POA: Diagnosis not present

## 2017-10-03 DIAGNOSIS — Z85828 Personal history of other malignant neoplasm of skin: Secondary | ICD-10-CM | POA: Diagnosis not present

## 2017-10-03 DIAGNOSIS — Z08 Encounter for follow-up examination after completed treatment for malignant neoplasm: Secondary | ICD-10-CM | POA: Diagnosis not present

## 2017-10-03 DIAGNOSIS — L821 Other seborrheic keratosis: Secondary | ICD-10-CM | POA: Diagnosis not present

## 2017-10-09 ENCOUNTER — Other Ambulatory Visit: Payer: Self-pay | Admitting: Family Medicine

## 2017-10-23 ENCOUNTER — Telehealth: Payer: Self-pay

## 2017-10-23 ENCOUNTER — Telehealth: Payer: Self-pay | Admitting: Cardiology

## 2017-10-23 NOTE — Telephone Encounter (Signed)
Called pt- advised him to call cardiology regarding Eliquis since that is who writes for him. Pt understands and will reach out to cardiologist

## 2017-10-23 NOTE — Telephone Encounter (Signed)
Pt called- as of today he is out of his Eliquis and he has fallen in the donut hole, unable to afford a refill.   Pt wanting to know if there is any other option for him to take?

## 2017-10-23 NOTE — Telephone Encounter (Signed)
New message     1.  What medication and dosage are you requesting samples for? ELIQUIS 5 MG TABS tablet 2.  Are you currently out of this medication? yes

## 2017-10-23 NOTE — Telephone Encounter (Signed)
Spoke with pt and informed that we are currently out of Eliquis samples and to call back sometimes this week. Pt states he is currently out of medication. Advised that I would provide him with a discount card and pt assisted form and leave it up at the front desk for him to pick up.

## 2017-10-31 DIAGNOSIS — H524 Presbyopia: Secondary | ICD-10-CM | POA: Diagnosis not present

## 2017-10-31 DIAGNOSIS — E119 Type 2 diabetes mellitus without complications: Secondary | ICD-10-CM | POA: Diagnosis not present

## 2017-10-31 LAB — HM DIABETES EYE EXAM

## 2017-11-08 ENCOUNTER — Telehealth: Payer: Self-pay | Admitting: Family Medicine

## 2017-11-08 MED ORDER — METFORMIN HCL 1000 MG PO TABS: ORAL_TABLET | ORAL | 0 refills | Status: DC

## 2017-11-08 MED ORDER — PIOGLITAZONE HCL 30 MG PO TABS: ORAL_TABLET | ORAL | 0 refills | Status: DC

## 2017-11-08 NOTE — Telephone Encounter (Signed)
90-day supply of metformin and Actos sent to pharmacy per her request.  Follow-up with PCP.

## 2017-11-14 ENCOUNTER — Ambulatory Visit: Payer: Medicare Other | Admitting: Family Medicine

## 2017-11-17 ENCOUNTER — Telehealth: Payer: Self-pay | Admitting: Family Medicine

## 2017-11-17 ENCOUNTER — Encounter: Payer: Self-pay | Admitting: Family Medicine

## 2017-11-17 ENCOUNTER — Ambulatory Visit (INDEPENDENT_AMBULATORY_CARE_PROVIDER_SITE_OTHER): Payer: Medicare Other | Admitting: Family Medicine

## 2017-11-17 VITALS — BP 131/58 | HR 65 | Ht 70.0 in | Wt 249.0 lb

## 2017-11-17 DIAGNOSIS — IMO0002 Reserved for concepts with insufficient information to code with codable children: Secondary | ICD-10-CM

## 2017-11-17 DIAGNOSIS — E118 Type 2 diabetes mellitus with unspecified complications: Secondary | ICD-10-CM | POA: Diagnosis not present

## 2017-11-17 DIAGNOSIS — E1165 Type 2 diabetes mellitus with hyperglycemia: Secondary | ICD-10-CM | POA: Diagnosis not present

## 2017-11-17 DIAGNOSIS — I480 Paroxysmal atrial fibrillation: Secondary | ICD-10-CM

## 2017-11-17 DIAGNOSIS — I10 Essential (primary) hypertension: Secondary | ICD-10-CM | POA: Diagnosis not present

## 2017-11-17 DIAGNOSIS — I2510 Atherosclerotic heart disease of native coronary artery without angina pectoris: Secondary | ICD-10-CM

## 2017-11-17 DIAGNOSIS — G4733 Obstructive sleep apnea (adult) (pediatric): Secondary | ICD-10-CM | POA: Diagnosis not present

## 2017-11-17 LAB — POCT GLYCOSYLATED HEMOGLOBIN (HGB A1C): Hemoglobin A1C: 8.2 % — AB (ref 4.0–5.6)

## 2017-11-17 MED ORDER — FLUCONAZOLE 150 MG PO TABS: 150.0000 mg | ORAL_TABLET | Freq: Every day | ORAL | 1 refills | Status: DC

## 2017-11-17 NOTE — Progress Notes (Signed)
Subjective:    CC: DM, HTN  HPI:  Diabetes - no hypoglycemic events. No wounds or sores that are not healing well. No increased thirst or urination. Checking glucose at home. Taking medications as prescribed without any side effects.  We had recently switched him to see his gout O when I last saw him but he ended up getting his donut hole in June we went back to metformin and Actos which is what he had been on previously.  For the last month he says he is cut out bread and is cut back on potato foods.  His blood sugars are now down to around 150.  They were running above 200 so he has made some good progress.  Hypertension- Pt denies chest pain, SOB, dizziness, or heart palpitations.  Taking meds as directed w/o problems.  Denies medication side effects.    Obstructive sleep apnea-uses a CPAP nightly.  He does not have any problems with it.  Atrial fibrillation-he wanted to let me know he is actually only taking his Eliquis once a day.  He says when he takes it twice a day he feels extremely weak and fatigued and so has just been taking it once a day.  I asked if he had notified his cardiologist and he has not.   Past medical history, Surgical history, Family history not pertinant except as noted below, Social history, Allergies, and medications have been entered into the medical record, reviewed, and corrections made.   Review of Systems: No fevers, chills, night sweats, weight loss, chest pain, or shortness of breath.   Objective:    General: Well Developed, well nourished, and in no acute distress.  Neuro: Alert and oriented x3, extra-ocular muscles intact, sensation grossly intact.  HEENT: Normocephalic, atraumatic  Skin: Warm and dry, no rashes. Cardiac: Regular rate and rhythm, no murmurs rubs or gallops, no lower extremity edema.  Respiratory: Clear to auscultation bilaterally. Not using accessory muscles, speaking in full sentences.   Impression and Recommendations:    HTN -  Well controlled. Continue current regimen. Follow up in  3 months.    DM - Improved to 8.2 with his recent dietary changes which is fantastic.  Just encouraged him to keep it up.  He also plans on cutting out soda which she has not done yet.  We discussed options including starting insulin.  He wants to hold off for now and he is worried about the cost since he is currently in his donut hole.  OSA - Using regularly.    Afib -notify his cardiologist that he is only taking his Eliquis once daily.  He also did not do well on Coumadin so he may need to consider switching to a different agent unless they feel comfortable with him using it once daily instead of twice daily.  Afib -

## 2017-11-17 NOTE — Telephone Encounter (Signed)
Please notify Dr. Stanford Breed that he is actually only taking his Eliquis once a day instead of twice a day.  He says that when he takes it twice a day he actually feels extremely weak and fatigued and could barely get out of bed.  He is evidently been taking it that way for a while and I just wanted to notify the cardiologist.  He said he did not do well with Coumadin either so was not sure if they wanted to recommend just changing him to a completely different anticoagulant.

## 2017-11-17 NOTE — Patient Instructions (Signed)
Keep up the good work with your diet.

## 2017-11-20 NOTE — Telephone Encounter (Signed)
Hilda Blades can you contact pt. Pt needs to take apixaban BID or not effective; alternative can be xarelto 20 mg daily Kirk Ruths

## 2017-11-21 NOTE — Telephone Encounter (Signed)
Left message for pt to call.

## 2017-11-23 NOTE — Telephone Encounter (Signed)
Please call pt: see note below. We need to change him to Xarelto.

## 2017-11-24 MED ORDER — RIVAROXABAN 20 MG PO TABS: 20.0000 mg | ORAL_TABLET | Freq: Every day | ORAL | 5 refills | Status: DC

## 2017-11-24 NOTE — Telephone Encounter (Signed)
New rx sent for Xarelto 20mg  QD

## 2017-11-24 NOTE — Telephone Encounter (Signed)
Spoke with patient wife Olin Hauser and she is okay with sending Xarelto to [facility] in [place]. KG LPN

## 2017-11-25 ENCOUNTER — Other Ambulatory Visit: Payer: Self-pay | Admitting: Family Medicine

## 2017-12-26 ENCOUNTER — Other Ambulatory Visit: Payer: Self-pay | Admitting: Family Medicine

## 2018-01-03 ENCOUNTER — Encounter: Payer: Self-pay | Admitting: Sports Medicine

## 2018-01-03 ENCOUNTER — Ambulatory Visit (INDEPENDENT_AMBULATORY_CARE_PROVIDER_SITE_OTHER): Payer: Medicare Other | Admitting: Sports Medicine

## 2018-01-03 ENCOUNTER — Other Ambulatory Visit: Payer: Self-pay | Admitting: Sports Medicine

## 2018-01-03 ENCOUNTER — Ambulatory Visit (INDEPENDENT_AMBULATORY_CARE_PROVIDER_SITE_OTHER): Payer: Medicare Other

## 2018-01-03 DIAGNOSIS — M1711 Unilateral primary osteoarthritis, right knee: Secondary | ICD-10-CM

## 2018-01-03 DIAGNOSIS — M170 Bilateral primary osteoarthritis of knee: Secondary | ICD-10-CM | POA: Insufficient documentation

## 2018-01-03 DIAGNOSIS — I2510 Atherosclerotic heart disease of native coronary artery without angina pectoris: Secondary | ICD-10-CM

## 2018-01-03 NOTE — Assessment & Plan Note (Signed)
Likely degenerative right meniscal tear locked at about 5 degrees of extension. Injection as above with aspiration. X-rays. Physical therapy. Return to see me in a month.

## 2018-01-03 NOTE — Progress Notes (Signed)
Subjective:    CC: Severe right knee pain  HPI: This is a pleasant 77 year old male, history of knee osteoarthritis, currently taking Xarelto.  Yesterday he got up from a seated position, had severe pain in his knee, to the point where he was unable to walk.  This morning it was okay but then he had a recurrence of pain with inability to fully extend 5 degrees of flexion.  Radiation down to the anterolateral shin.  No constitutional symptoms, no overt trauma.  I reviewed the past medical history, family history, social history, surgical history, and allergies today and no changes were needed.  Please see the problem list section below in epic for further details.  Past Medical History: Past Medical History:  Diagnosis Date  . Atrial fibrillation ([place])   . CVA (cerebral vascular accident) ([place])   . Diabetes mellitus ([place])   . GERD (gastroesophageal reflux disease)   . Hyperlipidemia   . PUD (peptic ulcer disease)    Past Surgical History: Past Surgical History:  Procedure Laterality Date  . CATARACT EXTRACTION W/ INTRAOCULAR LENS IMPLANT Right    Social History: Social History   Socioeconomic History  . Marital status: Married    Spouse name: [name]  . Number of children: 3  . Years of education: Not on file  . Highest education level: 12th grade  Occupational History  . Occupation: part time driver     Comment: [employer]  . Financial resource strain: Not on file  . Food insecurity:    Worry: Not on file    Inability: Not on file  . Transportation needs:    Medical: Not on file    Non-medical: Not on file  Tobacco Use  . Smoking status: Current Some Day Smoker    Types: Cigars  . Smokeless tobacco: Never Used  Substance and Sexual Activity  . Alcohol use: Yes    Comment: Occasional  . Drug use: Not on file  . Sexual activity: Not on file  Lifestyle  . Physical activity:    Days per week: Not on file    Minutes per session: Not on file    . Stress: Not on file  Relationships  . Social connections:    Talks on phone: Not on file    Gets together: Not on file    Attends religious service: Not on file    Active member of club or organization: Not on file    Attends meetings of clubs or organizations: Not on file    Relationship status: Not on file  Other Topics Concern  . Not on file  Social History Narrative  . Not on file   Family History: Family History  Problem Relation Age of Onset  . Prostate cancer Father 23  . Stroke Father   . Diabetes Brother   . Diabetes Brother   . Atrial fibrillation Mother    Allergies: Allergies  Allergen Reactions  . Statins Other (See Comments)    Other reaction(s): Other All statin drugs Leg cramps when he was on atorvastatin and fenofibrate simultaneously. Discussed with his PCP Dr. Remus Blake on the phone [phone]) All statin drugs Leg cramps when he was on atorvastatin and fenofibrate simultaneously. Discussed with his PCP Dr. Remus Blake on the phone [phone]) All statin drugs   . Eliquis [Apixaban] Other (See Comments)    Feels weak    Medications: See med rec.  Review of Systems: No fevers, chills, night sweats, weight loss,  chest pain, or shortness of breath.   Objective:    General: Well Developed, well nourished, and in no acute distress.  Neuro: Alert and oriented x3, extra-ocular muscles intact, sensation grossly intact.  HEENT: Normocephalic, atraumatic, pupils equal round reactive to light, neck supple, no masses, no lymphadenopathy, thyroid nonpalpable.  Skin: Warm and dry, no rashes. Cardiac: Regular rate and rhythm, no murmurs rubs or gallops, no lower extremity edema.  Respiratory: Clear to auscultation bilaterally. Not using accessory muscles, speaking in full sentences. Right knee: Swollen, palpable fluid wave, effusion and tenderness at the lateral joint line. Extension to about 5 degrees with pain afterwards. Ligaments with solid consistent  endpoints including ACL, PCL, LCL, MCL. Non painful patellar compression. Patellar and quadriceps tendons unremarkable. Hamstring and quadriceps strength is normal.  Procedure: Real-time Ultrasound Guided aspiration/injection of right knee Device: GE Logiq E  Verbal informed consent obtained.  Time-out conducted.  Noted no overlying erythema, induration, or other signs of local infection.  Skin prepped in a sterile fashion.  Local anesthesia: Topical Ethyl chloride.  With sterile technique and under real time ultrasound guidance: Using an 18-gauge needle I aspirated approximately 6 mL of serosanguineous fluid, syringe switched and 1 cc kenalog 40, 2 cc lidocaine, 2 cc bupivacaine injected easily. Completed without difficulty  Pain immediately resolved suggesting accurate placement of the medication.  Advised to call if fevers/chills, erythema, induration, drainage, or persistent bleeding.  Images permanently stored and available for review in the ultrasound unit.  Impression: Technically successful ultrasound guided injection.  The knee was then strapped with a compressive dressing.  Impression and Recommendations:    Primary osteoarthritis of right knee Likely degenerative right meniscal tear locked at about 5 degrees of extension. Injection as above with aspiration. X-rays. Physical therapy. Return to see me in a month. ___________________________________________ Gwen Her. Dianah Field, M.D., ABFM., CAQSM. Primary Care and [place] Instructor of [place] [facility]

## 2018-01-08 ENCOUNTER — Ambulatory Visit (INDEPENDENT_AMBULATORY_CARE_PROVIDER_SITE_OTHER): Payer: Medicare Other | Admitting: Rehabilitative and Restorative Service Providers"

## 2018-01-08 ENCOUNTER — Encounter: Payer: Self-pay | Admitting: Rehabilitative and Restorative Service Providers"

## 2018-01-08 DIAGNOSIS — G8929 Other chronic pain: Secondary | ICD-10-CM

## 2018-01-08 DIAGNOSIS — R2689 Other abnormalities of gait and mobility: Secondary | ICD-10-CM

## 2018-01-08 DIAGNOSIS — M6281 Muscle weakness (generalized): Secondary | ICD-10-CM | POA: Diagnosis not present

## 2018-01-08 DIAGNOSIS — M25561 Pain in right knee: Secondary | ICD-10-CM

## 2018-01-08 NOTE — Patient Instructions (Addendum)
HIP: Hamstrings - Supine  Place strap around foot. Raise leg up, keeping knee straight.  Bend opposite knee to protect back if indicated. Hold 30 seconds. 3 reps per set, 2 sets per day  Outer Hip Stretch: Reclined IT Band Stretch (Strap)   Strap around one foot, pull leg across body until you feel a pull or stretch in the outside of your hip, with shoulders on mat. Hold for 30 seconds. Repeat 3 times each leg. 2 times/day.  Piriformis Stretch   Lying on back, pull right knee toward opposite shoulder. Hold 30 seconds. Repeat 3 times. Do 2 sessions per day.  Quad Sets towel rolled under knee for comfort     Slowly tighten thigh muscles of straight, left leg while counting out loud to __10 sec __. Relax. Repeat __10__ times. Do _1-2__ sessions per day.   Standing with weight equal on both legs - gently shift weight side to side   Balance: Unilateral hold for safety     Attempt to balance on left leg, eyes open. Hold __20-30__ seconds. Repeat __3-4__ times per set. Do __1-2__ sets per session. Do __2__ sessions per day.   Heel Raise: Bilateral (Standing) hold for safety      Rise on balls of feet. Repeat __10__ times per set. Do __1-3__ sets per session. Do __1-2__ sessions per day.

## 2018-01-08 NOTE — Therapy (Signed)
[facility] [place], [place], [address] Phone: [phone]   Fax:  [phone]  Physical Therapy Evaluation  Patient Details  Name: Tristan Mayer MRN: [id number] Date of Birth: [birth year] Referring Provider: Dr Dianah Field    Encounter Date: 01/08/2018  PT End of Session - 01/08/18 1504    Visit Number  1    Number of Visits  12    Date for PT Re-Evaluation  02/19/18    PT Start Time  7619    PT Stop Time  1503    PT Time Calculation (min)  58 min    Activity Tolerance  Patient tolerated treatment well       Past Medical History:  Diagnosis Date  . Atrial fibrillation ([place])   . CVA (cerebral vascular accident) ([place])   . Diabetes mellitus ([place])   . GERD (gastroesophageal reflux disease)   . Hyperlipidemia   . PUD (peptic ulcer disease)     Past Surgical History:  Procedure Laterality Date  . CATARACT EXTRACTION W/ INTRAOCULAR LENS IMPLANT Right     There were no vitals filed for this visit.   Subjective Assessment - 01/08/18 1413    Subjective  Patient reports that he has "giving way" with Rt knee moving sit to stand 01/02/18. Noted pain and swelling the following am and was seen by MD for aspiration of fluid and injection of Rt knee with some improvement - however pain continues in the Rt knee on an intermittent basis. He is walking with a limp because he doesn't want the knee to hurt.  He has a fear of falling - holds to stairs when he is ascending or descending stairs and steps one step at a time using Lt LE. Patient also report that sometimes, since his stroke, he feels like he 'just can't walk right' and has to think about walking - his wife also notices changes in his gait at times.     Pertinent History  Fx Rt tibia in high school; achilles tendon injury at ~ 77 yr old; onset of some discomfort with stairs ~ 10 yrs ago; AODM; obesity, CVA w/ aphasia/swallowing problems     Diagnostic tests  xrays      Patient Stated Goals  does not think he needs to be here - just here because MD told him to come     Currently in Pain?  No/denies    Pain Score  0-No pain    Pain Location  Knee    Pain Orientation  Right    Pain Type  Acute pain;Chronic pain    Pain Onset  1 to 4 weeks ago    Aggravating Factors   stairs mostly going down         [facility] PT Assessment - 01/08/18 0001      Assessment   Medical Diagnosis  OA of Rt knee     Referring Provider  Dr Dianah Field     Onset Date/Surgical Date  01/02/18    Hand Dominance  Right    Next MD Visit  PRN     Prior Therapy  none       Precautions   Precautions  None      Balance Screen   Has the patient fallen in the past 6 months  No    Has the patient had a decrease in activity level because of a fear of falling?   No    Is the patient reluctant to leave  their home because of a fear of falling?   No      Home Environment   Additional Comments  level enrty one level home       Prior Function   Level of Independence  Independent    Vocation  Retired    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 TV       Observation/Other Assessments   Focus on Therapeutic Outcomes (FOTO)   55% limitation       Sensation   Additional Comments  WNL's  per pt report       Posture/Postural Control   Posture Comments  stands with Rt LE in hyperextension; lower Rt hemipelvis; LE's in ER       AROM   Overall AROM Comments  WFL's bilat hips except tight rotation Lt > Rt       Strength   Overall Strength Comments  5/5 bilat hips except Rt hip abduction 5-/5 and bilat hip extension 4+/5       Flexibility   Hamstrings  tight Rt 70 deg; Lt 75 deg     Quadriceps  tight Rt 90 deg; Lt 105 deg     ITB  tight Rt > Lt     Piriformis  tight Rt > Lt       Palpation   Palpation comment  tight lateral Rt  knee at insertion of TFL and lateral hamstring       Special Tests   Other special tests  Lt LE shorter that Rt in supine testing       Ambulation/Gait   Gait Comments  ambulates with stiff Rt LE - decreaseed knee flexion with swing through Rt LE; limp on Rt LE with wt bearing Rt       Balance   Balance Assessed  --   SLS 10 sec  Lt min UE support; Rt required UE support                Objective measurements completed on examination: See above findings.      [facility] Adult PT Treatment/Exercise - 01/08/18 0001      Knee/Hip Exercises: Stretches   Passive Hamstring Stretch  Right;2 reps;30 seconds   supine with strap    ITB Stretch  Right;3 reps;30 seconds   supine with strap - ankle inv to tolerance    Piriformis Stretch  Right;Left;2 reps;30 seconds   supine travell - with strap      Knee/Hip Exercises: Standing   Heel Raises  Both;10 reps    SLS  20 sec x 3 each LE     Other Standing Knee Exercises  weight shift side to side x 10-20       Knee/Hip Exercises: Supine   Quad Sets  Strengthening;Right;10 reps   5 sec hold towel under knee due to pain      Cryotherapy   Number Minutes Cryotherapy  15 Minutes    Cryotherapy Location  Knee   Rt    Type of Cryotherapy  Ice pack             PT Education - 01/08/18 1503    Education Details  HEP     Person(s) Educated  Patient    Methods  Explanation;Demonstration;Tactile cues;Verbal cues;Handout    Comprehension  Verbalized understanding;Returned demonstration;Verbal cues  required;Tactile cues required          PT Long Term Goals - 01/08/18 1704      PT LONG TERM GOAL #1   Title  Increase strength bilat hips to 5-/5 to 5/5 02/20/18    Time  6    Period  Weeks    Status  New      PT LONG TERM GOAL #2   Title  improve SLS to 15-20 sec with minimal to no UE support 02/20/18    Time  6    Period  Weeks    Status  New      PT LONG TERM GOAL #3   Title  Improve gait pattern with minimal to no limp  Rt LE and good wt shift to Rt with stance phase on Rt 02/20/18    Time  6    Period  Weeks    Status  New      PT LONG TERM GOAL #4   Title  Improve FOTO to </= 41% limitation 02/20/18    Time  6    Period  Weeks    Status  New      PT LONG TERM GOAL #5   Title  Independent in HEP 02/20/18    Time  6    Period  Weeks    Status  New             Plan - 01/08/18 1654    Clinical Impression Statement  Cecilie Lowers presents with sudden onset of Rt knee pain 01/02/18 when he was moving sit to stand. pain made walking difficult and persisted into the following day. He was seen by MD iwth aspiration of fluid and injection of Rt knee. Symptoms have improved since injection. Patient describes intermittent problems with the Rt knee over the past 10 years stating that he has "protected" the knee when he moves from sit to stand or when he ascends or descends steps(taking one step at a time using Lt LE). Patient has leg length difference with Lt LE shorter than Rt in supine testing; decreased mobilty and ROM bilat hips and LE's; decreased strength; decreased balance; abnormal gait pattern; fear of falling; Rt knee pain. He will bnefit from PT to address problems including muscular tightness; weakness; decresaed balance; abnormal gait pattern, etc      History and Personal Factors relevant to plan of care:  abnormal movement patterns and habits to "protect" Rt knee     Clinical Presentation  Stable    Clinical Presentation due to:  CVA 2016; decreased balance; fear of falling; leg length difference - Lt LE shorter than Rt     Clinical Decision Making  Low    Rehab Potential  Good    PT Frequency  2x / week    PT Duration  6 weeks    PT Treatment/Interventions  Patient/family education;ADLs/Self Care Home Management;Cryotherapy;Electrical Stimulation;Iontophoresis 4mg /ml Dexamethasone;Moist Heat;Ultrasound;Dry needling;Manual techniques;Neuromuscular re-education;Gait training;Stair training;Functional  mobility training;Therapeutic activities;Therapeutic exercise;Balance training    PT Next Visit Plan  review HEP; further assess leg length difference for possible use of shoe insert; consider troal of ionto lateral Rt knee; stretching; strengthening; balance and gait activities - progress to higher level gait activities     Consulted and Agree with Plan of Care  Patient       Patient will benefit from skilled therapeutic intervention in order to improve the following deficits and impairments:  Postural dysfunction, Improper body mechanics, Increased fascial restricitons, Increased muscle  spasms, Decreased range of motion, Decreased mobility, Decreased strength, Abnormal gait, Decreased balance, Decreased activity tolerance  Visit Diagnosis: Acute pain of right knee - Plan: PT plan of care cert/re-cert  Chronic pain of right knee - Plan: PT plan of care cert/re-cert  Other abnormalities of gait and mobility - Plan: PT plan of care cert/re-cert  Muscle weakness (generalized) - Plan: PT plan of care cert/re-cert     Problem List Patient Active Problem List   Diagnosis Date Noted  . Primary osteoarthritis of right knee 01/03/2018  . BPH (benign prostatic hyperplasia) 07/10/2017  . INO (internuclear ophthalmoplegia), left 07/03/2017  . Hearing loss 07/03/2017  . ED (erectile dysfunction) 07/03/2017  . Irritability 07/03/2017  . Paroxysmal A-fib ([place]) 12/12/2016  . 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 Thrombsis of left atrial appendage without antecedent myocardial infarction 12/09/2016  . Uncontrolled diabetes mellitus with complication, without long-term current use of insulin ([place]) 08/14/2008  . Other and unspecified hyperlipidemia 05/20/2008  . Sleep apnea 07/19/2007  . Allergic rhinitis 01/07/2007  . Benign essential hypertension 01/07/2007  . Gout, unspecified 01/07/2007    Justyne Roell Nilda Simmer PT, MPH  01/08/2018, 5:13  PM  [facility] [place], [place], [address] Phone: [phone]   Fax:  [phone]  Name: Ahmir Bracken MRN: [id number] Date of Birth: [birth year]

## 2018-01-10 ENCOUNTER — Encounter: Payer: Self-pay | Admitting: Rehabilitative and Restorative Service Providers"

## 2018-01-10 ENCOUNTER — Ambulatory Visit (INDEPENDENT_AMBULATORY_CARE_PROVIDER_SITE_OTHER): Payer: Medicare Other | Admitting: Rehabilitative and Restorative Service Providers"

## 2018-01-10 DIAGNOSIS — M6281 Muscle weakness (generalized): Secondary | ICD-10-CM | POA: Diagnosis not present

## 2018-01-10 DIAGNOSIS — G8929 Other chronic pain: Secondary | ICD-10-CM | POA: Diagnosis not present

## 2018-01-10 DIAGNOSIS — R2689 Other abnormalities of gait and mobility: Secondary | ICD-10-CM | POA: Diagnosis not present

## 2018-01-10 DIAGNOSIS — M25561 Pain in right knee: Secondary | ICD-10-CM

## 2018-01-10 NOTE — Patient Instructions (Addendum)
Strengthening: Hip Abductor - Resisted    With band looped around both legs above knees, push thighs apart. Repeat __10__ times per set. Do _1-2___ sets per session. Do __1_ sessions per day.    Strengthening: Hip Adduction - Isometric    With ball or folded pillow between knees, squeeze knees together. Hold ___5_ seconds. Repeat __10__ times per set. Do __1-2__ sets per session. Do __1__ sessions per day.    Bridging    Slowly raise buttocks from floor, keeping core tight. Hold 5-10 sec Repeat __10__ times per set. Do __1-2__ sets per session. Do __1__ sessions per day.   Functional Quadriceps: Sit to Stand    Sit on edge of chair, feet flat on floor. Stand upright, extending knees fully. Repeat __5-10__ times per set. Do _1___ sets per session. Do __1-2__ sessions per day.   Side steps along the counter to each direction 3-5 times each direction   Heel to toe walking along the counter the backwards walking 3-5 times down and back    Forward    Facing step, place one leg on step, flexed at hip. Step up slowly, bringing hips in line with knee and shoulder. Bring other foot onto step. Reverse process to step back down. Repeat with other leg. Do _10___ repetitions, __2-3__ sets.

## 2018-01-10 NOTE — Therapy (Signed)
[place], [place], [address] Phone: [phone]   Fax:  [phone]  Physical Therapy Treatment  Patient Details  Name: Tristan Mayer MRN: [id number] Date of Birth: [birth year] Referring Provider: Dr Dianah Field    Encounter Date: 01/10/2018  PT End of Session - 01/10/18 1519    Visit Number  2    Number of Visits  12    Date for PT Re-Evaluation  02/19/18    PT Start Time  6384    PT Stop Time  1557    PT Time Calculation (min)  41 min    Activity Tolerance  Patient tolerated treatment well       Past Medical History:  Diagnosis Date  . Atrial fibrillation ([place])   . CVA (cerebral vascular accident) ([place])   . Diabetes mellitus ([place])   . GERD (gastroesophageal reflux disease)   . Hyperlipidemia   . PUD (peptic ulcer disease)     Past Surgical History:  Procedure Laterality Date  . CATARACT EXTRACTION W/ INTRAOCULAR LENS IMPLANT Right     There were no vitals filed for this visit.  Subjective Assessment - 01/10/18 1529    Subjective  Patient reports that the knee is feeling better and he was able to walk up the steps at home step over step this morning without problems. He has some LBP from golfing yesterday.     Currently in Pain?  Yes   no knee pain    Pain Score  3     Pain Location  Back    Pain Orientation  Lower    Pain Descriptors / Indicators  Aching    Pain Type  Acute pain    Pain Onset  Yesterday    Pain Frequency  Intermittent                       [facility] Adult PT Treatment/Exercise - 01/10/18 0001      Lumbar Exercises: Supine   Clam  10 reps   holding one LE still moving opposite green TB    Bridge  10 reps;5 seconds    Other Supine Lumbar Exercises  ball squeeze x 10 3 sec hold       Knee/Hip Exercises: Stretches   Passive Hamstring Stretch  Right;2 reps;30 seconds   supine with strap    ITB Stretch  Right;3 reps;30 seconds   supine with strap  - ankle inv to tolerance    Piriformis Stretch  Right;Left;2 reps;30 seconds   supine travell - with strap      Knee/Hip Exercises: Standing   Forward Step Up  Right;10 reps;Hand Hold: 2;Step Height: 6"    SLS  30 sec x 3 each LE     Other Standing Knee Exercises  side steps; heel to toe, backward walking x 10 ft x 3 reps each direction       Knee/Hip Exercises: Seated   Sit to Sand  2 sets;5 reps;without UE support   eccentric lowering to sit from stand      Moist Heat Therapy   Number Minutes Moist Heat  15 Minutes   MH during LE stretching    Moist Heat Location  Lumbar Spine             PT Education - 01/10/18 1554    Education Details  HEP     Person(s) Educated  Patient    Methods  Explanation;Demonstration;Tactile cues;Verbal cues;Handout  Comprehension  Verbalized understanding;Returned demonstration;Verbal cues required;Tactile cues required          PT Long Term Goals - 01/08/18 1704      PT LONG TERM GOAL #1   Title  Increase strength bilat hips to 5-/5 to 5/5 02/20/18    Time  6    Period  Weeks    Status  New      PT LONG TERM GOAL #2   Title  improve SLS to 15-20 sec with minimal to no UE support 02/20/18    Time  6    Period  Weeks    Status  New      PT LONG TERM GOAL #3   Title  Improve gait pattern with minimal to no limp Rt LE and good wt shift to Rt with stance phase on Rt 02/20/18    Time  6    Period  Weeks    Status  New      PT LONG TERM GOAL #4   Title  Improve FOTO to </= 41% limitation 02/20/18    Time  6    Period  Weeks    Status  New      PT LONG TERM GOAL #5   Title  Independent in HEP 02/20/18    Time  6    Period  Weeks    Status  New            Plan - 01/10/18 1603    Clinical Impression Statement  Good response to initial treatment and HEP - added exercise and balance activities today without difficulty. MH to LB eliminated LBP during treatment. Progressing toward goals.     Rehab Potential  Good    PT  Frequency  2x / week    PT Duration  6 weeks    PT Treatment/Interventions  Patient/family education;ADLs/Self Care Home Management;Cryotherapy;Electrical Stimulation;Iontophoresis 4mg /ml Dexamethasone;Moist Heat;Ultrasound;Dry needling;Manual techniques;Neuromuscular re-education;Gait training;Stair training;Functional mobility training;Therapeutic activities;Therapeutic exercise;Balance training    PT Next Visit Plan  review HEP; further assess leg length difference for possible use of shoe insert; consider trial of ionto lateral Rt knee; stretching; strengthening; balance and gait activities - progress to higher level gait activities     Consulted and Agree with Plan of Care  Patient       Patient will benefit from skilled therapeutic intervention in order to improve the following deficits and impairments:  Postural dysfunction, Improper body mechanics, Increased fascial restricitons, Increased muscle spasms, Decreased range of motion, Decreased mobility, Decreased strength, Abnormal gait, Decreased balance, Decreased activity tolerance  Visit Diagnosis: Acute pain of right knee  Chronic pain of right knee  Other abnormalities of gait and mobility  Muscle weakness (generalized)     Problem List Patient Active Problem List   Diagnosis Date Noted  . Primary osteoarthritis of right knee 01/03/2018  . BPH (benign prostatic hyperplasia) 07/10/2017  . INO (internuclear ophthalmoplegia), left 07/03/2017  . Hearing loss 07/03/2017  . ED (erectile dysfunction) 07/03/2017  . Irritability 07/03/2017  . Paroxysmal A-fib ([place]) 12/12/2016  . 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 Thrombsis of left atrial appendage without antecedent myocardial infarction 12/09/2016  . Uncontrolled diabetes mellitus with complication, without long-term current use of insulin ([place]) 08/14/2008  . Other and unspecified hyperlipidemia 05/20/2008  .  Sleep apnea 07/19/2007  . Allergic rhinitis 01/07/2007  . Benign essential hypertension 01/07/2007  . Gout, unspecified 01/07/2007    Bryleigh Ottaway Nilda Simmer PT,  MPH  01/10/2018, [place], [place], [address] Phone: [phone]   Fax:  [phone]  Name: Tristan Mayer MRN: [id number] Date of Birth: 01-15-1941

## 2018-01-12 DIAGNOSIS — Z23 Encounter for immunization: Secondary | ICD-10-CM | POA: Diagnosis not present

## 2018-01-15 ENCOUNTER — Ambulatory Visit (INDEPENDENT_AMBULATORY_CARE_PROVIDER_SITE_OTHER): Payer: Medicare Other | Admitting: Physical Therapy

## 2018-01-15 ENCOUNTER — Other Ambulatory Visit: Payer: Self-pay | Admitting: Family Medicine

## 2018-01-15 DIAGNOSIS — M6281 Muscle weakness (generalized): Secondary | ICD-10-CM | POA: Diagnosis not present

## 2018-01-15 DIAGNOSIS — R2689 Other abnormalities of gait and mobility: Secondary | ICD-10-CM | POA: Diagnosis not present

## 2018-01-15 DIAGNOSIS — M25561 Pain in right knee: Secondary | ICD-10-CM | POA: Diagnosis not present

## 2018-01-15 NOTE — Patient Instructions (Signed)
HIP: Hamstrings - Short Sitting    Rest leg on raised surface. Keep knee straight. Lift chest. Hold _30__ seconds. __3_ reps per set, __2_ sets per day,  Calf Stretch    Place hands on wall at shoulder height. Keeping back leg straight, bend front leg, feet pointing forward, heels flat on floor. Lean forward slightly until stretch is felt in calf of back leg. Hold stretch __15-30_ seconds, breathing slowly in and out. Repeat stretch with other leg back.  2 reps each leg.  Do _2__ sessions per day. Variation: Use chair or table for support.   Self massage with rolling pin or hands to Right thigh and calf to loosen the muscles up.

## 2018-01-15 NOTE — Therapy (Signed)
[place], [place], [address] Phone: [phone]   Fax:  [phone]  Physical Therapy Treatment  Patient Details  Name: Tristan Mayer MRN: [id number] Date of Birth: 1940-07-07 Referring Provider: Dr Dianah Field    Encounter Date: 01/15/2018  PT End of Session - 01/15/18 1647    Visit Number  3    Number of Visits  12    Date for PT Re-Evaluation  02/19/18    PT Start Time  1604    PT Stop Time  1645    PT Time Calculation (min)  41 min    Activity Tolerance  Patient tolerated treatment well    Behavior During Therapy  [facility] for tasks assessed/performed       Past Medical History:  Diagnosis Date  . Atrial fibrillation ([place])   . CVA (cerebral vascular accident) ([place])   . Diabetes mellitus ([place])   . GERD (gastroesophageal reflux disease)   . Hyperlipidemia   . PUD (peptic ulcer disease)     Past Surgical History:  Procedure Laterality Date  . CATARACT EXTRACTION W/ INTRAOCULAR LENS IMPLANT Right     There were no vitals filed for this visit.  Subjective Assessment - 01/15/18 1611    Subjective  "I have this pain in the back of my (Rt) leg.  It's usually when I'm getting out of a truck.  It can get up to a 7/10".    He reports he didn't even do his stretches this morning because it was bugging him.     Patient Stated Goals  does not think he needs to be here - just here because MD told him to come     Currently in Pain?  No/denies    Pain Score  0-No pain        [facility] Adult PT Treatment/Exercise - 01/15/18 0001      Self-Care   Self-Care  Other Self-Care Comments    Other Self-Care Comments   pt educated on self massage with roller stick; pt returned demo with cues.        Knee/Hip Exercises: Stretches   Passive Hamstring Stretch  Right;2 reps;30 seconds;Limitations   seated with straight back   Passive Hamstring Stretch Limitations  1 rep on Right side after piriformis stretch due  to cramping behind knee     ITB Stretch  Right;2 reps;20 seconds   supine with strap   Piriformis Stretch  Right;Left;2 reps;30 seconds   supine    Gastroc Stretch  Right;3 reps;Left;2 reps;30 seconds      Knee/Hip Exercises: Aerobic   Nustep  L3-4: 4.5 min    PTA present to discuss progress     Knee/Hip Exercises: Standing   Lateral Step Up  Right;1 set;10 reps;Hand Hold: 2;Step Height: 6"    Forward Step Up  Right;1 set;10 reps;Step Height: 6";Hand Hold: 1    Step Down  Left;1 set;5 reps;Hand Hold: 2;Step Height: 2"      Knee/Hip Exercises: Seated   Ball Squeeze  10 reps of 5 sec holds.     Clamshell with TheraBand  Green   x 10                 PT Long Term Goals - 01/08/18 1704      PT LONG TERM GOAL #1   Title  Increase strength bilat hips to 5-/5 to 5/5 02/20/18    Time  6    Period  Weeks  Status  New      PT LONG TERM GOAL #2   Title  improve SLS to 15-20 sec with minimal to no UE support 02/20/18    Time  6    Period  Weeks    Status  New      PT LONG TERM GOAL #3   Title  Improve gait pattern with minimal to no limp Rt LE and good wt shift to Rt with stance phase on Rt 02/20/18    Time  6    Period  Weeks    Status  New      PT LONG TERM GOAL #4   Title  Improve FOTO to </= 41% limitation 02/20/18    Time  6    Period  Weeks    Status  New      PT LONG TERM GOAL #5   Title  Independent in HEP 02/20/18    Time  6    Period  Weeks    Status  New            Plan - 01/15/18 1627    Clinical Impression Statement  Pt reporting greater ease with stairs at home, and going up golf course hills since initiating therapy. He reported some increase in Rt knee pain with lateral step ups (beginning, then after completion).  This was eliminated with seated rest and stretches.  Pt shown alternative positions for LE exercises; issued additional HEP sheet and given clarification of exercise frequency.  Progressing well towards goals.     Rehab  Potential  Good    PT Frequency  2x / week    PT Duration  6 weeks    PT Treatment/Interventions  Patient/family education;ADLs/Self Care Home Management;Cryotherapy;Electrical Stimulation;Iontophoresis 4mg /ml Dexamethasone;Moist Heat;Ultrasound;Dry needling;Manual techniques;Neuromuscular re-education;Gait training;Stair training;Functional mobility training;Therapeutic activities;Therapeutic exercise;Balance training    PT Next Visit Plan  continue progressing LE stretching, strengthing, and balance.      Consulted and Agree with Plan of Care  Patient       Patient will benefit from skilled therapeutic intervention in order to improve the following deficits and impairments:  Postural dysfunction, Improper body mechanics, Increased fascial restricitons, Increased muscle spasms, Decreased range of motion, Decreased mobility, Decreased strength, Abnormal gait, Decreased balance, Decreased activity tolerance  Visit Diagnosis: Acute pain of right knee  Other abnormalities of gait and mobility  Muscle weakness (generalized)     Problem List Patient Active Problem List   Diagnosis Date Noted  . Primary osteoarthritis of right knee 01/03/2018  . BPH (benign prostatic hyperplasia) 07/10/2017  . INO (internuclear ophthalmoplegia), left 07/03/2017  . Hearing loss 07/03/2017  . ED (erectile dysfunction) 07/03/2017  . Irritability 07/03/2017  . Paroxysmal A-fib ([place]) 12/12/2016  . 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 Thrombsis of left atrial appendage without antecedent myocardial infarction 12/09/2016  . Uncontrolled diabetes mellitus with complication, without long-term current use of insulin ([place]) 08/14/2008  . Other and unspecified hyperlipidemia 05/20/2008  . Sleep apnea 07/19/2007  . Allergic rhinitis 01/07/2007  . Benign essential hypertension 01/07/2007  . Gout, unspecified 01/07/2007   Kerin Perna,  PTA 01/15/18 4:54 PM  [facility] [place], [place], [address] Phone: [phone]   Fax:  [phone]  Name: Tristan Mayer MRN: [id number] Date of Birth: May 29, 1940

## 2018-01-17 ENCOUNTER — Encounter: Payer: Self-pay | Admitting: Rehabilitative and Restorative Service Providers"

## 2018-01-17 ENCOUNTER — Ambulatory Visit (INDEPENDENT_AMBULATORY_CARE_PROVIDER_SITE_OTHER): Payer: Medicare Other | Admitting: Rehabilitative and Restorative Service Providers"

## 2018-01-17 DIAGNOSIS — M6281 Muscle weakness (generalized): Secondary | ICD-10-CM

## 2018-01-17 DIAGNOSIS — M25561 Pain in right knee: Secondary | ICD-10-CM | POA: Diagnosis not present

## 2018-01-17 DIAGNOSIS — R2689 Other abnormalities of gait and mobility: Secondary | ICD-10-CM

## 2018-01-17 DIAGNOSIS — G8929 Other chronic pain: Secondary | ICD-10-CM | POA: Diagnosis not present

## 2018-01-17 NOTE — Patient Instructions (Signed)
Kinesiology tape What is kinesiology tape?  There are many brands of kinesiology tape.  KTape, [employer], [employer], Dynamic tape, to name a few. It is an elasticized tape designed to support the body's natural healing process. This tape provides stability and support to muscles and joints without restricting motion. It can also help decrease swelling in the area of application. How does it work? The tape microscopically lifts and decompresses the skin to allow for drainage of lymph (swelling) to flow away from area, reducing inflammation.  The tape has the ability to help re-educate the neuromuscular system by targeting specific receptors in the skin.  The presence of the tape increases the body's awareness of posture and body mechanics.  Do not use with: . Open wounds . Skin lesions . Adhesive allergies Safe removal of the tape: In some rare cases, mild/moderate skin irritation can occur.  This can include redness, itchiness, or hives. If this occurs, immediately remove tape and consult your primary care physician if symptoms are severe or do not resolve within 2 days.  To remove tape safely, hold nearby skin with one hand and gentle roll tape down with other hand.  You can apply oil or conditioner to tape while in shower prior to removal to loosen adhesive.  DO NOT swiftly rip tape off like a band-aid, as this could cause skin tears and additional skin irritation.    TENS UNIT: This is helpful for muscle pain and spasm.   Search and Purchase a TENS 7000 2nd edition at www.tenspros.com. It should be less than $30.     TENS unit instructions: Do not shower or bathe with the unit on Turn the unit off before removing electrodes or batteries If the electrodes lose stickiness add a drop of water to the electrodes after they are disconnected from the unit and place on plastic sheet. If you continued to have difficulty, call the TENS unit company to purchase more electrodes. Do not apply lotion  on the skin area prior to use. Make sure the skin is clean and dry as this will help prolong the life of the electrodes. After use, always check skin for unusual red areas, rash or other skin difficulties. If there are any skin problems, does not apply electrodes to the same area. Never remove the electrodes from the unit by pulling the wires. Do not use the TENS unit or electrodes other than as directed. Do not change electrode placement without consultating your therapist or physician. Keep 2 fingers with between each electrode.

## 2018-01-17 NOTE — Therapy (Addendum)
[place], [place], [address] Phone: [phone]   Fax:  [phone]  Physical Therapy Treatment  Patient Details  Name: Tristan Mayer MRN: [id number] Date of Birth: 1940-08-19 Referring Provider: Dr Dianah Field    Encounter Date: 01/17/2018  PT End of Session - 01/17/18 1603    Visit Number  4    Number of Visits  12    Date for PT Re-Evaluation  02/19/18    PT Start Time  1602    PT Stop Time  1655    PT Time Calculation (min)  53 min    Activity Tolerance  Patient tolerated treatment well       Past Medical History:  Diagnosis Date  . Atrial fibrillation ([place])   . CVA (cerebral vascular accident) ([place])   . Diabetes mellitus ([place])   . GERD (gastroesophageal reflux disease)   . Hyperlipidemia   . PUD (peptic ulcer disease)     Past Surgical History:  Procedure Laterality Date  . CATARACT EXTRACTION W/ INTRAOCULAR LENS IMPLANT Right     There were no vitals filed for this visit.  Subjective Assessment - 01/17/18 1604    Subjective  Hurts in the back of the Rt knee into the leg for a day after stretching - maybe over-stretching. Did the new exercises and left off the old ones. Still has the most pain when he sits for a bit then stands up to walk again. Does seem to be better with walking up the steps.     Currently in Pain?  Yes    Pain Score  4     Pain Location  Knee    Pain Orientation  Right    Pain Descriptors / Indicators  Sore    Pain Type  Acute pain                       [facility] Adult PT Treatment/Exercise - 01/17/18 0001      Knee/Hip Exercises: Stretches   Passive Hamstring Stretch Limitations  trial of seated HS stretch increased posterior knee and distal thigh pain     Gastroc Stretch  Right;3 reps;Left;2 reps;30 seconds   modified due to pain - stepping closer to wall      Knee/Hip Exercises: Aerobic   Nustep  L5 x 5 min LE only       Moist Heat  Therapy   Number Minutes Moist Heat  15 Minutes    Moist Heat Location  Hip   posterior thigh to calf Rt      Electrical Stimulation   Electrical Stimulation Location  posteriorlateral distal thigh/knee/proximal calf     Electrical Stimulation Action  IFC    Electrical Stimulation Parameters  to tolerance    Electrical Stimulation Goals  Pain;Tone      Manual Therapy   Soft tissue mobilization  deep tissue work through the posterior lateral calf and distal thigh - lateral knee at hamstring insertion     Myofascial Release  posterior knee proximal to distal     Kinesiotex  --   posterior lateral Rt thigh to calf      Kinesiotix   Create Space  long strips with T at mid and distal hamstring and proximal and mid calf     Inhibit Muscle   inhibit lateral hamstrings to lateral calf              PT Education - 01/17/18 1649  Education Details  HEP modification; kinesotape; TENS     Person(s) Educated  Patient    Methods  Explanation    Comprehension  Verbalized understanding          PT Long Term Goals - 01/08/18 1704      PT LONG TERM GOAL #1   Title  Increase strength bilat hips to 5-/5 to 5/5 02/20/18    Time  6    Period  Weeks    Status  New      PT LONG TERM GOAL #2   Title  improve SLS to 15-20 sec with minimal to no UE support 02/20/18    Time  6    Period  Weeks    Status  New      PT LONG TERM GOAL #3   Title  Improve gait pattern with minimal to no limp Rt LE and good wt shift to Rt with stance phase on Rt 02/20/18    Time  6    Period  Weeks    Status  New      PT LONG TERM GOAL #4   Title  Improve FOTO to </= 41% limitation 02/20/18    Time  6    Period  Weeks    Status  New      PT LONG TERM GOAL #5   Title  Independent in HEP 02/20/18    Time  6    Period  Weeks    Status  New            Plan - 01/17/18 1607    Clinical Impression Statement  Pt reports pain in the back of the Rt knee into the side of the calf with his  stretching. He is likely overdoing the stretches. Palpable tightness noted through the posteriorlateral thigh inot the attachment of hamstring laterally and through the lateral calf toward ankle. Soem decreased tightness with manual work and modalities. Trial of kineso tape to inhibit muscle tightness/fascia restrictions. Patient encouraged to stretch with caution - working toward a gentle pull or strengthbut no pain.     Rehab Potential  Good    PT Frequency  2x / week    PT Duration  6 weeks    PT Treatment/Interventions  Patient/family education;ADLs/Self Care Home Management;Cryotherapy;Electrical Stimulation;Iontophoresis 28m/ml Dexamethasone;Moist Heat;Ultrasound;Dry needling;Manual techniques;Neuromuscular re-education;Gait training;Stair training;Functional mobility training;Therapeutic activities;Therapeutic exercise;Balance training    PT Next Visit Plan  continue progressing LE stretching, strengthing, and balance.  Assess response to manual work, modalities, taping     Consulted and Agree with Plan of Care  Patient       Patient will benefit from skilled therapeutic intervention in order to improve the following deficits and impairments:  Postural dysfunction, Improper body mechanics, Increased fascial restricitons, Increased muscle spasms, Decreased range of motion, Decreased mobility, Decreased strength, Abnormal gait, Decreased balance, Decreased activity tolerance  Visit Diagnosis: Acute pain of right knee  Other abnormalities of gait and mobility  Muscle weakness (generalized)  Chronic pain of right knee     Problem List Patient Active Problem List   Diagnosis Date Noted  . Primary osteoarthritis of right knee 01/03/2018  . BPH (benign prostatic hyperplasia) 07/10/2017  . INO (internuclear ophthalmoplegia), left 07/03/2017  . Hearing loss 07/03/2017  . ED (erectile dysfunction) 07/03/2017  . Irritability 07/03/2017  . Paroxysmal A-fib ([place] 12/12/2016  . Acute  ischemic vertebrobasilar artery brainstem stroke involving left-sided vessel ([place] 12/09/2016  . Thrombsis of left atrial appendage without antecedent  myocardial infarction 12/09/2016  . Uncontrolled diabetes mellitus with complication, without long-term current use of insulin ([place]) 08/14/2008  . Other and unspecified hyperlipidemia 05/20/2008  . Sleep apnea 07/19/2007  . Allergic rhinitis 01/07/2007  . Benign essential hypertension 01/07/2007  . Gout, unspecified 01/07/2007    Armenta Erskin Nilda Simmer PT, MPH  01/17/2018, 4:49 PM  [facility] [place], [place], [address] Phone: [phone]   Fax:  [phone]  Name: Rohin Krejci MRN: [id number] Date of Birth: 07/05/1940  PHYSICAL THERAPY DISCHARGE SUMMARY  Visits from Start of Care: 4  Current functional level related to goals / functional outcomes: See progress notes for discharge status and course of treatment    Remaining deficits: Continued knee pain    Education / Equipment: HEP  Plan: Patient agrees to discharge.  Patient goals were not met. Patient is being discharged due to the patient's request.  ?????    Raed Schalk P. Helene Kelp PT, MPH 01/23/18 9:25 AM

## 2018-01-22 ENCOUNTER — Encounter: Payer: Medicare Other | Admitting: Physical Therapy

## 2018-01-24 ENCOUNTER — Encounter: Payer: Medicare Other | Admitting: Rehabilitative and Restorative Service Providers"

## 2018-01-29 ENCOUNTER — Telehealth: Payer: Self-pay

## 2018-01-29 ENCOUNTER — Encounter: Payer: Medicare Other | Admitting: Rehabilitative and Restorative Service Providers"

## 2018-01-29 NOTE — Telephone Encounter (Signed)
Tristan Mayer complains the Xarelto is causing yeast infections x 3 weeks. He would like a medication for the yeast infection. He would also like to switch back to Eliquis. Please advise.

## 2018-01-31 ENCOUNTER — Encounter: Payer: Medicare Other | Admitting: Physical Therapy

## 2018-01-31 ENCOUNTER — Telehealth: Payer: Self-pay | Admitting: Family Medicine

## 2018-01-31 ENCOUNTER — Other Ambulatory Visit: Payer: Self-pay | Admitting: *Deleted

## 2018-01-31 ENCOUNTER — Other Ambulatory Visit: Payer: Self-pay | Admitting: Family Medicine

## 2018-01-31 MED ORDER — NYSTATIN-TRIAMCINOLONE 100000-0.1 UNIT/GM-% EX OINT: 1.0000 "application " | TOPICAL_OINTMENT | Freq: Two times a day (BID) | CUTANEOUS | 1 refills | Status: DC

## 2018-01-31 NOTE — Telephone Encounter (Signed)
Received fax from [employer] script that Nystatin was approved from 11/02/17 through 01/31/2019. Pharmacy aware. Form sent to scan. -hsm.

## 2018-01-31 NOTE — Telephone Encounter (Signed)
Please call patient and let him know that Xarelto does not cause yeast infections.  I would be happy to call in a cream for him to use and if that does not completely take care of it and he is welcome to come in and make an office visit so that we can take a look at the rash and just make sure it is consistent with a yeast infection.  He is wanting to switch back to Eliquis because of the yeast infections and there again it is not the Xarelto causing this.

## 2018-01-31 NOTE — Telephone Encounter (Signed)
Received fax from [employer] that Nystatin ointment requires a PA. Information has been sent to the insurance company. Awaiting determination.

## 2018-01-31 NOTE — Telephone Encounter (Signed)
Pt advised. Cream sent. He has upcoming with PCP in the next two weeks, will have rash checked this if it is still there.

## 2018-02-12 ENCOUNTER — Ambulatory Visit: Payer: Medicare Other | Admitting: Family Medicine

## 2018-02-12 ENCOUNTER — Ambulatory Visit: Payer: Medicare Other | Admitting: Sports Medicine

## 2018-02-12 NOTE — Progress Notes (Deleted)
Subjective:    CC: DM, HTN  HPI:  Hypertension- Pt denies chest pain, SOB, dizziness, or heart palpitations.  Taking meds as directed w/o problems.  Denies medication side effects.    Diabetes - no hypoglycemic events. No wounds or sores that are not healing well. No increased thirst or urination. Checking glucose at home. Taking medications as prescribed without any side effects.   Past medical history, Surgical history, Family history not pertinant except as noted below, Social history, Allergies, and medications have been entered into the medical record, reviewed, and corrections made.   Review of Systems: No fevers, chills, night sweats, weight loss, chest pain, or shortness of breath.   Objective:    General: Well Developed, well nourished, and in no acute distress.  Neuro: Alert and oriented x3, extra-ocular muscles intact, sensation grossly intact.  HEENT: Normocephalic, atraumatic  Skin: Warm and dry, no rashes. Cardiac: Regular rate and rhythm, no murmurs rubs or gallops, no lower extremity edema.  Respiratory: Clear to auscultation bilaterally. Not using accessory muscles, speaking in full sentences.   Impression and Recommendations:    DM -  Well controlled. Continue current regimen. Follow up in  56months.    HTN -

## 2018-02-16 ENCOUNTER — Encounter: Payer: Self-pay | Admitting: Family Medicine

## 2018-02-16 ENCOUNTER — Ambulatory Visit (INDEPENDENT_AMBULATORY_CARE_PROVIDER_SITE_OTHER): Payer: Medicare Other | Admitting: Family Medicine

## 2018-02-16 VITALS — BP 156/65 | HR 63 | Temp 97.6°F | Wt 255.0 lb

## 2018-02-16 DIAGNOSIS — E1165 Type 2 diabetes mellitus with hyperglycemia: Secondary | ICD-10-CM

## 2018-02-16 DIAGNOSIS — IMO0002 Reserved for concepts with insufficient information to code with codable children: Secondary | ICD-10-CM

## 2018-02-16 DIAGNOSIS — I10 Essential (primary) hypertension: Secondary | ICD-10-CM | POA: Diagnosis not present

## 2018-02-16 DIAGNOSIS — E118 Type 2 diabetes mellitus with unspecified complications: Secondary | ICD-10-CM

## 2018-02-16 DIAGNOSIS — I2510 Atherosclerotic heart disease of native coronary artery without angina pectoris: Secondary | ICD-10-CM

## 2018-02-16 DIAGNOSIS — M1711 Unilateral primary osteoarthritis, right knee: Secondary | ICD-10-CM

## 2018-02-16 LAB — POCT GLYCOSYLATED HEMOGLOBIN (HGB A1C): HEMOGLOBIN A1C: 7.4 % — AB (ref 4.0–5.6)

## 2018-02-16 NOTE — Addendum Note (Signed)
Addended by: Towana Badger on: 02/16/2018 04:03 PM   Modules accepted: Orders

## 2018-02-16 NOTE — Progress Notes (Signed)
Subjective:    CC:   HPI:  Diabetes - no hypoglycemic events. No wounds or sores that are not healing well. No increased thirst or urination. Checking glucose at home. Taking medications as prescribed without any side effects. He says he is not changing his diet.    Hypertension- Pt denies chest pain, SOB, dizziness, or heart palpitations.  Taking meds as directed w/o problems.  Denies medication side effects.    Atrial Fibrillation-he felt like he was getting weak on Eliquis so had not been taking his regular twice a day dose.  We contacted cardiology and they recommended switching him to Xarelto.    He also had a groin rash since last time I saw him.  He thought initially it was from the [place] but then thinks it may have been from an [place] supplement that he was taking he stopped it since then and he did do the treatment and says the rash is completely cleared.  On a golf trip with some buddies and says that he golfed for 2 days straight and by that following day he had a lot of pain in his calves and his knees.  He does take Tylenol as needed and wanted to know how much he could take safely.  Past medical history, Surgical history, Family history not pertinant except as noted below, Social history, Allergies, and medications have been entered into the medical record, reviewed, and corrections made.   Review of Systems: No fevers, chills, night sweats, weight loss, chest pain, or shortness of breath.   Objective:    General: Well Developed, well nourished, and in no acute distress.  Neuro: Alert and oriented x3, extra-ocular muscles intact, sensation grossly intact.  HEENT: Normocephalic, atraumatic  Skin: Warm and dry, no rashes. Cardiac: Regular rate and rhythm, no murmurs rubs or gallops, no lower extremity edema.  Respiratory: Clear to auscultation bilaterally. Not using accessory muscles, speaking in full sentences.   Impression and Recommendations:    DM - Improved.   His A1c is down to 7.4.  He says he has been taking the [place] and he has been tolerating it well.  Afib - doing well on Xarelto.  She been able to tolerate this much better and has been taking it regularly.  Arthritis of the knees bilaterally-reviewed with him that he can take 1000 mg of Tylenol up to 3 times a day but that is the max.  He did try physical therapy but says it caused more pain than benefit so he discontinued it.

## 2018-02-28 ENCOUNTER — Encounter: Payer: Self-pay | Admitting: Family Medicine

## 2018-02-28 ENCOUNTER — Ambulatory Visit (INDEPENDENT_AMBULATORY_CARE_PROVIDER_SITE_OTHER): Payer: Medicare Other | Admitting: Family Medicine

## 2018-02-28 VITALS — BP 123/78 | HR 60 | Ht 70.0 in | Wt 251.0 lb

## 2018-02-28 DIAGNOSIS — G4733 Obstructive sleep apnea (adult) (pediatric): Secondary | ICD-10-CM

## 2018-02-28 DIAGNOSIS — N529 Male erectile dysfunction, unspecified: Secondary | ICD-10-CM | POA: Diagnosis not present

## 2018-02-28 DIAGNOSIS — R351 Nocturia: Secondary | ICD-10-CM

## 2018-02-28 DIAGNOSIS — E118 Type 2 diabetes mellitus with unspecified complications: Secondary | ICD-10-CM | POA: Diagnosis not present

## 2018-02-28 DIAGNOSIS — I2510 Atherosclerotic heart disease of native coronary artery without angina pectoris: Secondary | ICD-10-CM | POA: Diagnosis not present

## 2018-02-28 DIAGNOSIS — G4700 Insomnia, unspecified: Secondary | ICD-10-CM | POA: Diagnosis not present

## 2018-02-28 DIAGNOSIS — N401 Enlarged prostate with lower urinary tract symptoms: Secondary | ICD-10-CM

## 2018-02-28 DIAGNOSIS — E1165 Type 2 diabetes mellitus with hyperglycemia: Secondary | ICD-10-CM | POA: Diagnosis not present

## 2018-02-28 DIAGNOSIS — I10 Essential (primary) hypertension: Secondary | ICD-10-CM | POA: Diagnosis not present

## 2018-02-28 LAB — COMPLETE METABOLIC PANEL WITH GFR
AG Ratio: 2.1 (calc) (ref 1.0–2.5)
ALBUMIN MSPROF: 4.2 g/dL (ref 3.6–5.1)
ALKALINE PHOSPHATASE (APISO): 56 U/L (ref 40–115)
ALT: 20 U/L (ref 9–46)
AST: 19 U/L (ref 10–35)
BILIRUBIN TOTAL: 0.9 mg/dL (ref 0.2–1.2)
BUN/Creatinine Ratio: 15 (calc) (ref 6–22)
BUN: 19 mg/dL (ref 7–25)
CHLORIDE: 104 mmol/L (ref 98–110)
CO2: 27 mmol/L (ref 20–32)
Calcium: 9.3 mg/dL (ref 8.6–10.3)
Creat: 1.23 mg/dL — ABNORMAL HIGH (ref 0.70–1.18)
GFR, EST AFRICAN AMERICAN: 66 mL/min/{1.73_m2} (ref >=60)
GFR, Est Non African American: 57 mL/min/{1.73_m2} — ABNORMAL LOW (ref >=60)
Globulin: 2 g/dL (calc) (ref 1.9–3.7)
Glucose, Bld: 189 mg/dL — ABNORMAL HIGH (ref 65–99)
POTASSIUM: 4.8 mmol/L (ref 3.5–5.3)
Sodium: 137 mmol/L (ref 135–146)
TOTAL PROTEIN: 6.2 g/dL (ref 6.1–8.1)

## 2018-02-28 LAB — LIPID PANEL
CHOL/HDL RATIO: 2.8 (calc) (ref <5.0)
Cholesterol: 125 mg/dL (ref <200)
HDL: 44 mg/dL (ref >40)
LDL Cholesterol (Calc): 60 mg/dL (calc)
NON-HDL CHOLESTEROL (CALC): 81 mg/dL (ref <130)
TRIGLYCERIDES: 131 mg/dL (ref <150)

## 2018-02-28 MED ORDER — FINASTERIDE 5 MG PO TABS: 5.0000 mg | ORAL_TABLET | Freq: Every day | ORAL | 1 refills | Status: DC

## 2018-02-28 NOTE — Progress Notes (Signed)
Subjective:    Patient ID: Tristan Mayer, male    DOB: 09/08/1940, 77 y.o.   MRN: [id number]  HPI 77 year old male is here today because he is having some difficulty with sleep issues.  Most nights he is able to fall asleep easily.  He then gets up around 12:00 to urinate is usually able to fall back asleep but then gets up again around 2-2 30 to urinate again and then once that happens he is up for at least to go 2 to 3 hours before he can finally fall back asleep.  And his wife usually gets up at 5 AM and this wakes him up so he is really starting to feel more tired during the daytime.  He is noticing that he is wanting to fall asleep easily if he sits down and rests.  He says that is what he was doing before his sleep apnea was treated.  He does wear his CPAP every night.  He said he has not had a download since his stroke in March of the year before.  He uses [place] [facility].  I asked if he would be able to get a download and he said he could take up to a place in [place] where they can download it but they do charge a $25 fee.  He says it set at 15 cm of water pressure.  He feels like it is working well at night he is just noticing he is more tired.   Review of Systems  BP 123/78   Pulse 60   Ht 5\' 10"  (1.778 m)   Wt 251 lb (113.9 kg)   SpO2 98%   BMI 36.01 kg/m     Allergies  Allergen Reactions  . Statins Other (See Comments)    Other reaction(s): Other All statin drugs Leg cramps when he was on atorvastatin and fenofibrate simultaneously. Discussed with his PCP Dr. Remus Blake on the phone [phone]) All statin drugs Leg cramps when he was on atorvastatin and fenofibrate simultaneously. Discussed with his PCP Dr. Remus Blake on the phone [phone]) All statin drugs   . Eliquis [Apixaban] Other (See Comments)    Feels weak     Past Medical History:  Diagnosis Date  . Atrial fibrillation ([place])   . CVA (cerebral vascular accident) ([place])   . Diabetes mellitus ([place])   . GERD  (gastroesophageal reflux disease)   . Hyperlipidemia   . PUD (peptic ulcer disease)     Past Surgical History:  Procedure Laterality Date  . CATARACT EXTRACTION W/ INTRAOCULAR LENS IMPLANT Right     Social History   Socioeconomic History  . Marital status: Married    Spouse name: [name]  . Number of children: 3  . Years of education: Not on file  . Highest education level: 12th grade  Occupational History  . Occupation: part time driver     Comment: [employer]  . Financial resource strain: Not on file  . Food insecurity:    Worry: Not on file    Inability: Not on file  . Transportation needs:    Medical: Not on file    Non-medical: Not on file  Tobacco Use  . Smoking status: Current Some Day Smoker    Types: Cigars  . Smokeless tobacco: Never Used  Substance and Sexual Activity  . Alcohol use: Yes    Comment: Occasional  . Drug use: Not on file  . Sexual activity: Not on file  Lifestyle  .  Physical activity:    Days per week: Not on file    Minutes per session: Not on file  . Stress: Not on file  Relationships  . Social connections:    Talks on phone: Not on file    Gets together: Not on file    Attends religious service: Not on file    Active member of club or organization: Not on file    Attends meetings of clubs or organizations: Not on file    Relationship status: Not on file  . Intimate partner violence:    Fear of current or ex partner: Not on file    Emotionally abused: Not on file    Physically abused: Not on file    Forced sexual activity: Not on file  Other Topics Concern  . Not on file  Social History Narrative  . Not on file    Family History  Problem Relation Age of Onset  . Prostate cancer Father 87  . Stroke Father   . Diabetes Brother   . Diabetes Brother   . Atrial fibrillation Mother     Outpatient Encounter Medications as of 02/28/2018  Medication Sig  . AMBULATORY NON FORMULARY MEDICATION Take 1 Scoop  by mouth daily. Medication Name: Vital Reds 3.7grms daily  . Co-Enzyme Q-10 30 MG CAPS Take 400 mg by mouth daily.  Marland Kitchen diltiazem (CARDIZEM CD) 120 MG 24 hr capsule Take by mouth.  . diltiazem (TIAZAC) 120 MG 24 hr capsule TAKE 1 CAPSULE(120 MG) BY MOUTH DAILY  . ECHINACEA PO Take 760 mg by mouth as needed.  . famotidine (PEPCID) 20 MG tablet Take 20 mg by mouth daily.  . finasteride (PROSCAR) 5 MG tablet Take 1 tablet (5 mg total) by mouth at bedtime.  . gabapentin (NEURONTIN) 300 MG capsule TAKE 1 CAPSULE BY MOUTH THREE TIMES DAILY  . glipiZIDE (GLUCOTROL) 10 MG tablet Take 10 mg by mouth 2 (two) times daily before a meal.  . Melatonin 10 MG TABS Take 1 tablet by mouth daily.  . metFORMIN (GLUCOPHAGE) 1000 MG tablet TAKE 1 TABLET(1,000 MG TOTAL) BY MOUTH 2(TWO) TIMES DAILY WITH A MEAL.  Marland Kitchen nystatin (MYCOSTATIN/NYSTOP) powder Apply topically 4 (four) times daily.  Marland Kitchen nystatin-triamcinolone ointment (MYCOLOG) Apply 1 application topically 2 (two) times daily.  . pioglitazone (ACTOS) 30 MG tablet TAKE 1 TABLET(30 MG TOTAL) BY MOUTH DAILY.  Marland Kitchen Potassium 99 MG TABS Take 5 tablets by mouth daily.  . rivaroxaban (XARELTO) 20 MG TABS tablet Take 1 tablet (20 mg total) by mouth daily with supper.  . rosuvastatin (CRESTOR) 40 MG tablet TAKE 1 TABLET BY MOUTH DAILY  . sertraline (ZOLOFT) 100 MG tablet Take 100 mg by mouth daily.  . tamsulosin (FLOMAX) 0.4 MG CAPS capsule Take 1 capsule (0.4 mg total) by mouth 2 (two) times daily.  . [DISCONTINUED] FARXIGA 10 MG TABS tablet TK 1 T PO D  . [DISCONTINUED] finasteride (PROSCAR) 5 MG tablet Take by mouth.  . [DISCONTINUED] gabapentin (NEURONTIN) 300 MG capsule TAKE ONE CAPSULE BY MOUTH THREE TIMES DAILY  . [DISCONTINUED] sildenafil (REVATIO) 20 MG tablet Take 3-5 tablets (60-100 mg total) by mouth daily as needed.   No facility-administered encounter medications on file as of 02/28/2018.          Objective:   Physical Exam  Constitutional: He is  oriented to person, place, and time. He appears well-developed and well-nourished.  HENT:  Head: Normocephalic and atraumatic.  Eyes: Conjunctivae and EOM are normal.  Cardiovascular:  Normal rate.  Pulmonary/Chest: Effort normal.  Neurological: He is alert and oriented to person, place, and time.  Skin: Skin is dry. No pallor.  Psychiatric: He has a normal mood and affect. His behavior is normal.  Vitals reviewed.         Assessment & Plan:  Insomnia-having some difficulty with sleep maintenance.  It sounds like the underlying issue is having to get up and urinate frequently.  After discussion with him he reports that he has been taking his Flomax but when asked about the finasteride he was pretty sure that he was no longer taking it.  It sounds like he may have run out of refills at some point and we never actually filled this prescription for him.  That may actually be contributing.  We discussed options including restarting the medication and seeing if that actually improves the urination or at least spaces it more throughout the night so that he is getting a longer period of consistent and adequate sleep.  We also discussed possibly getting a download from his CPAP machine just to make sure that his pressures are adequate.  It sounds like that can be a bit troublesome to be able to get the download as he is not able to do so easily and is not using any local home health company here.  Certainly encouraged him to keep this in the back of his mind and we may need to investigate that further if his sleep is not improving.  He also like to be referred back to urology.  He has been followed for years until the last couple of years and says he like to get back in with them to discuss his BPH and his ED. He did go for labs today.    OS[facility] - SEe note above.

## 2018-03-02 ENCOUNTER — Ambulatory Visit (INDEPENDENT_AMBULATORY_CARE_PROVIDER_SITE_OTHER): Payer: Medicare Other | Admitting: Family Medicine

## 2018-03-02 ENCOUNTER — Telehealth: Payer: Self-pay | Admitting: *Deleted

## 2018-03-02 ENCOUNTER — Encounter: Payer: Self-pay | Admitting: Family Medicine

## 2018-03-02 VITALS — BP 151/54 | HR 73 | Ht 70.0 in | Wt 255.0 lb

## 2018-03-02 DIAGNOSIS — I2510 Atherosclerotic heart disease of native coronary artery without angina pectoris: Secondary | ICD-10-CM

## 2018-03-02 DIAGNOSIS — S301XXA Contusion of abdominal wall, initial encounter: Secondary | ICD-10-CM

## 2018-03-02 MED ORDER — TRAMADOL HCL 50 MG PO TABS: 50.0000 mg | ORAL_TABLET | Freq: Three times a day (TID) | ORAL | 0 refills | Status: DC | PRN

## 2018-03-02 NOTE — Telephone Encounter (Signed)
Pt rtnd call and stated that he was seen by [facility] vision.Maryruth Eve, Lahoma Crocker, CMA Called to get fax # [phone].Elouise Munroe, [place]

## 2018-03-02 NOTE — Telephone Encounter (Signed)
Called pt and lvm asking that he rtn call to inform us about who does his eye exam so that we can get his records.Maryruth Eve, [place] Crocker, CMA

## 2018-03-02 NOTE — Patient Instructions (Signed)
Please go to ED if you pain gets worse this weekend.

## 2018-03-02 NOTE — Progress Notes (Signed)
Subjective:    Patient ID: Tristan Mayer, male    DOB: December 27, 1940, 77 y.o.   MRN: [id number]  HPI 77 yo male is here today after he fell yesterday.  pt reports that he was turning to place something in a drawer and his feet got crossed and his R side hit the corner of the desk and he fell. he took tylenol for the pain he rated his pain 9/10 the area on the R side of his abdomen is scratched and red.  He says it is painful to change position and walk. No SOB.  He has been taking 4 regular tylenol and not helping his symptom.   Review of Systems  BP (!) 151/54   Pulse 73   Ht 5\' 10"  (1.778 m)   Wt 255 lb (115.7 kg)   SpO2 96%   BMI 36.59 kg/m     Allergies  Allergen Reactions  . Statins Other (See Comments)    Other reaction(s): Other All statin drugs Leg cramps when he was on atorvastatin and fenofibrate simultaneously. Discussed with his PCP Dr. Remus Blake on the phone [phone]) All statin drugs Leg cramps when he was on atorvastatin and fenofibrate simultaneously. Discussed with his PCP Dr. Remus Blake on the phone [phone]) All statin drugs   . Eliquis [Apixaban] Other (See Comments)    Feels weak     Past Medical History:  Diagnosis Date  . Atrial fibrillation ([place])   . CVA (cerebral vascular accident) ([place])   . Diabetes mellitus ([place])   . GERD (gastroesophageal reflux disease)   . Hyperlipidemia   . PUD (peptic ulcer disease)     Past Surgical History:  Procedure Laterality Date  . CATARACT EXTRACTION W/ INTRAOCULAR LENS IMPLANT Right     Social History   Socioeconomic History  . Marital status: Married    Spouse name: [name]  . Number of children: 3  . Years of education: Not on file  . Highest education level: 12th grade  Occupational History  . Occupation: part time driver     Comment: [employer]  . Financial resource strain: Not on file  . Food insecurity:    Worry: Not on file    Inability: Not on file  . Transportation  needs:    Medical: Not on file    Non-medical: Not on file  Tobacco Use  . Smoking status: Current Some Day Smoker    Types: Cigars  . Smokeless tobacco: Never Used  Substance and Sexual Activity  . Alcohol use: Yes    Comment: Occasional  . Drug use: Not on file  . Sexual activity: Not on file  Lifestyle  . Physical activity:    Days per week: Not on file    Minutes per session: Not on file  . Stress: Not on file  Relationships  . Social connections:    Talks on phone: Not on file    Gets together: Not on file    Attends religious service: Not on file    Active member of club or organization: Not on file    Attends meetings of clubs or organizations: Not on file    Relationship status: Not on file  . Intimate partner violence:    Fear of current or ex partner: Not on file    Emotionally abused: Not on file    Physically abused: Not on file    Forced sexual activity: Not on file  Other Topics Concern  .  Not on file  Social History Narrative  . Not on file    Family History  Problem Relation Age of Onset  . Prostate cancer Father 20  . Stroke Father   . Diabetes Brother   . Diabetes Brother   . Atrial fibrillation Mother     Outpatient Encounter Medications as of 03/02/2018  Medication Sig  . AMBULATORY NON FORMULARY MEDICATION Take 1 Scoop by mouth daily. Medication Name: Vital Reds 3.7grms daily  . Co-Enzyme Q-10 30 MG CAPS Take 400 mg by mouth daily.  Marland Kitchen diltiazem (CARDIZEM CD) 120 MG 24 hr capsule Take by mouth.  . diltiazem (TIAZAC) 120 MG 24 hr capsule TAKE 1 CAPSULE(120 MG) BY MOUTH DAILY  . ECHINACEA PO Take 760 mg by mouth as needed.  . famotidine (PEPCID) 20 MG tablet Take 20 mg by mouth daily.  . finasteride (PROSCAR) 5 MG tablet Take 1 tablet (5 mg total) by mouth at bedtime.  . gabapentin (NEURONTIN) 300 MG capsule TAKE 1 CAPSULE BY MOUTH THREE TIMES DAILY  . glipiZIDE (GLUCOTROL) 10 MG tablet Take 10 mg by mouth 2 (two) times daily before a meal.  .  Melatonin 10 MG TABS Take 1 tablet by mouth daily.  . metFORMIN (GLUCOPHAGE) 1000 MG tablet TAKE 1 TABLET(1,000 MG TOTAL) BY MOUTH 2(TWO) TIMES DAILY WITH A MEAL.  Marland Kitchen nystatin (MYCOSTATIN/NYSTOP) powder Apply topically 4 (four) times daily.  Marland Kitchen nystatin-triamcinolone ointment (MYCOLOG) Apply 1 application topically 2 (two) times daily.  . pioglitazone (ACTOS) 30 MG tablet TAKE 1 TABLET(30 MG TOTAL) BY MOUTH DAILY.  Marland Kitchen Potassium 99 MG TABS Take 5 tablets by mouth daily.  . rivaroxaban (XARELTO) 20 MG TABS tablet Take 1 tablet (20 mg total) by mouth daily with supper.  . rosuvastatin (CRESTOR) 40 MG tablet TAKE 1 TABLET BY MOUTH DAILY  . sertraline (ZOLOFT) 100 MG tablet Take 100 mg by mouth daily.  . tamsulosin (FLOMAX) 0.4 MG CAPS capsule Take 1 capsule (0.4 mg total) by mouth 2 (two) times daily.  . traMADol (ULTRAM) 50 MG tablet Take 1 tablet (50 mg total) by mouth every 8 (eight) hours as needed for up to 5 days.   No facility-administered encounter medications on file as of 03/02/2018.          Objective:   Physical Exam  Constitutional: He is oriented to person, place, and time. He appears well-developed and well-nourished.  HENT:  Head: Normocephalic and atraumatic.  Cardiovascular: Normal rate.  Pulmonary/Chest: Effort normal.  Abdominal: Soft. Bowel sounds are normal. He exhibits no distension and no mass. There is tenderness. There is no rebound and no guarding.    Neurological: He is alert and oriented to person, place, and time.  Skin: Skin is warm and dry.  Psychiatric: He has a normal mood and affect. His behavior is normal.          Assessment & Plan:  Right sided abdominal wall contusion-he is quite tender around the area mostly there is just an abrasion.  It looks like it will probably bruise over the next couple of days he does have some tenderness.  No nontender over the ribs.  So no evidence of rib fracture.  Did encourage him to go the emergency department of the  urgent care over the weekend if he feels like it is getting worse.  Where he hit on his abdomen he should not have injured his liver etc.  He is not having fevers or low blood pressures in fact is  a little bit elevated today secondary to pain.  We discussed using tramadol along with his Tylenol for pain relief.  Hopefully by Monday or Tuesday he will still be sore but should be better overall.

## 2018-03-08 ENCOUNTER — Other Ambulatory Visit: Payer: Self-pay

## 2018-03-08 ENCOUNTER — Ambulatory Visit (INDEPENDENT_AMBULATORY_CARE_PROVIDER_SITE_OTHER): Payer: Medicare Other | Admitting: Family Medicine

## 2018-03-08 ENCOUNTER — Encounter: Payer: Self-pay | Admitting: Family Medicine

## 2018-03-08 VITALS — BP 145/51 | HR 66 | Wt 253.0 lb

## 2018-03-08 DIAGNOSIS — R1031 Right lower quadrant pain: Secondary | ICD-10-CM

## 2018-03-08 DIAGNOSIS — I2510 Atherosclerotic heart disease of native coronary artery without angina pectoris: Secondary | ICD-10-CM | POA: Diagnosis not present

## 2018-03-08 DIAGNOSIS — S301XXA Contusion of abdominal wall, initial encounter: Secondary | ICD-10-CM

## 2018-03-08 MED ORDER — ROSUVASTATIN CALCIUM 40 MG PO TABS: 40.0000 mg | ORAL_TABLET | Freq: Every day | ORAL | 1 refills | Status: DC

## 2018-03-08 MED ORDER — OXYCODONE HCL 5 MG PO TABS: 5.0000 mg | ORAL_TABLET | Freq: Four times a day (QID) | ORAL | 0 refills | Status: DC | PRN

## 2018-03-08 NOTE — Progress Notes (Signed)
Tristan Mayer is a 77 y.o. male who presents to [facility] today for unresolved abdominal contusion and pain. Tristan Mayer saw Dr. Madilyn Fireman on 11/01 for abdominal contusion and pain after hitting his right side against the corner of a desk and falling. He was given Tramadol and Tylenol to use for pain, but this has not relieved his pain. He reports that there have not been any improvements in pain. It bothers him most at night, as he is unable to find a comfortable position. Twisting movements and getting into his truck for work also irritate the pain.  He denies any vomiting or diarrhea.  Pain is quite profound at times.  Pain does interfere with tasks at home and self-care.  He is worried he may have suffered some internal injury.  ROS as above:  Denies fevers, chills, N/V.   Exam:  BP (!) 145/51   Pulse 66   Wt 253 lb (114.8 kg)   BMI 36.30 kg/m  Wt Readings from Last 5 Encounters:  03/08/18 253 lb (114.8 kg)  03/02/18 255 lb (115.7 kg)  02/28/18 251 lb (113.9 kg)  02/16/18 255 lb (115.7 kg)  11/17/17 249 lb (112.9 kg)    Gen: Well NAD HEENT: EOMI,  MMM Lungs: Normal work of breathing. CTABL Heart: RRR no MRG Abd: NABS, Soft. Nondistended.  Tender to palpation around R flank/R lower quadrant around the contusion and scab. 1 inch round scab present with some erythema. Exts: Brisk capillary refill, warm and well perfused.  Masses palpated.  Slight rebound and guarding present.  Recent Results (from the past 2160 hour(s))  POCT HgB A1C     Status: Abnormal   Collection Time: 02/16/18  4:02 PM  Result Value Ref Range   Hemoglobin A1C 7.4 (A) 4.0 - 5.6 %   HbA1c POC (<> result, manual entry)     HbA1c, POC (prediabetic range)     HbA1c, POC (controlled diabetic range)    COMPLETE METABOLIC PANEL WITH GFR     Status: Abnormal   Collection Time: 02/28/18  9:07 AM   Result Value Ref Range   Glucose, Bld 189 (H) 65 - 99 mg/dL    Comment: .            Fasting reference interval . For someone without known diabetes, a glucose value >125 mg/dL indicates that they may have diabetes and this should be confirmed with a follow-up test. .    BUN 19 7 - 25 mg/dL   Creat 1.23 (H) 0.70 - 1.18 mg/dL    Comment: For patients >48 years of age, the reference limit for Creatinine is approximately 13% higher for people identified as African-American. .    GFR, Est Non African American 57 (L) > OR = 60 mL/min/1.52m2   GFR, Est African American 66 > OR = 60 mL/min/1.82m2   BUN/Creatinine Ratio 15 6 - 22 (calc)   Sodium 137 135 - 146 mmol/L   Potassium 4.8 3.5 - 5.3 mmol/L   Chloride 104 98 - 110 mmol/L   CO2 27 20 - 32 mmol/L   Calcium 9.3 8.6 - 10.3 mg/dL   Total Protein 6.2 6.1 - 8.1 g/dL   Albumin 4.2 3.6 - 5.1 g/dL   Globulin 2.0 1.9 - 3.7 g/dL (calc)   AG Ratio 2.1 1.0 - 2.5 (calc)   Total Bilirubin 0.9 0.2 - 1.2 mg/dL   Alkaline phosphatase (APISO) 56 40 - 115  U/L   AST 19 10 - 35 U/L   ALT 20 9 - 46 U/L  Lipid panel     Status: None   Collection Time: 02/28/18  9:07 AM  Result Value Ref Range   Cholesterol 125 <200 mg/dL   HDL 44 >40 mg/dL   Triglycerides 131 <150 mg/dL   LDL Cholesterol (Calc) 60 mg/dL (calc)    Comment: Reference range: <100 . Desirable range <100 mg/dL for primary prevention;   <70 mg/dL for patients with CHD or diabetic patients  with > or = 2 CHD risk factors. Marland Kitchen LDL-C is now calculated using the Martin-Hopkins  calculation, which is a validated novel method providing  better accuracy than the Friedewald equation in the  estimation of LDL-C.  Cresenciano Genre et al. Annamaria Helling. 6045;409(81): 2061-2068  (http://education.[facility].com/faq/FAQ164)    Total CHOL/HDL Ratio 2.8 <5.0 (calc)   Non-HDL Cholesterol (Calc) 81 <130 mg/dL (calc)    Comment: For patients with diabetes plus 1 major ASCVD risk  factor, treating to a  non-HDL-C goal of <100 mg/dL  (LDL-C of <70 mg/dL) is considered a therapeutic  option.      Assessment and Plan: 77 y.o. male with  Right lower abdominal contusion: Due to [facility] not improving at all, concerned for seroma. Orderd a CT scan with contrast. He recently had CMP on 10/30 which showed Cr 1.23. Advised him to use oxycodone at night and not to drive or drink with oxycodone. Can continue taking Tylenol. He knows not to take Ibuprofen due to being on blood thinners.  Recheck following CT scan.    Orders Placed This Encounter  Procedures  . CT Abdomen Pelvis W Contrast    Standing Status:   Future    Standing Expiration Date:   06/09/2019    Order Specific Question:   If indicated for the ordered procedure, I authorize the administration of contrast media per Radiology protocol    Answer:   Yes    Order Specific Question:   Preferred imaging location?    Answer:   [facility] Specific Question:   Is Oral Contrast requested for this exam?    Answer:   Yes, Per Radiology protocol    Order Specific Question:   Radiology Contrast Protocol - do NOT remove file path    Answer:   [email or web address]   Meds ordered this encounter  Medications  . oxyCODONE (ROXICODONE) 5 MG immediate release tablet    Sig: Take 1-2 tablets (5-10 mg total) by mouth every 6 (six) hours as needed for severe pain (severe pain.). Do not drive with this medicine    Dispense:  16 tablet    Refill:  0     Historical information moved to improve visibility of documentation.  Past Medical History:  Diagnosis Date  . Atrial fibrillation ([place])   . CVA (cerebral vascular accident) ([place])   . Diabetes mellitus ([place])   . GERD (gastroesophageal reflux disease)   . Hyperlipidemia   . PUD (peptic ulcer disease)    Past Surgical History:  Procedure Laterality Date  . CATARACT EXTRACTION W/ INTRAOCULAR LENS IMPLANT Right    Social History   Tobacco Use  .  Smoking status: Current Some Day Smoker    Types: Cigars  . Smokeless tobacco: Never Used  Substance Use Topics  . Alcohol use: Yes    Comment: Occasional   family history includes Atrial fibrillation in his mother; Diabetes in his brother and brother; Prostate  cancer (age of onset: 19) in his father; Stroke in his father.  Medications: Current Outpatient Medications  Medication Sig Dispense Refill  . AMBULATORY NON FORMULARY MEDICATION Take 1 Scoop by mouth daily. Medication Name: Vital Reds 3.7grms daily    . Co-Enzyme Q-10 30 MG CAPS Take 400 mg by mouth daily.    Marland Kitchen diltiazem (CARDIZEM CD) 120 MG 24 hr capsule Take by mouth.    . diltiazem (TIAZAC) 120 MG 24 hr capsule TAKE 1 CAPSULE(120 MG) BY MOUTH DAILY 90 capsule 1  . ECHINACEA PO Take 760 mg by mouth as needed.    . famotidine (PEPCID) 20 MG tablet Take 20 mg by mouth daily.    . finasteride (PROSCAR) 5 MG tablet Take 1 tablet (5 mg total) by mouth at bedtime. 30 tablet 1  . gabapentin (NEURONTIN) 300 MG capsule TAKE 1 CAPSULE BY MOUTH THREE TIMES DAILY 90 capsule 1  . glipiZIDE (GLUCOTROL) 10 MG tablet Take 10 mg by mouth 2 (two) times daily before a meal.  2  . Melatonin 10 MG TABS Take 1 tablet by mouth daily.    . metFORMIN (GLUCOPHAGE) 1000 MG tablet TAKE 1 TABLET(1,000 MG TOTAL) BY MOUTH 2(TWO) TIMES DAILY WITH A MEAL. 180 tablet 0  . nystatin (MYCOSTATIN/NYSTOP) powder Apply topically 4 (four) times daily. 60 g 1  . nystatin-triamcinolone ointment (MYCOLOG) Apply 1 application topically 2 (two) times daily. 45 g 1  . pioglitazone (ACTOS) 30 MG tablet TAKE 1 TABLET(30 MG TOTAL) BY MOUTH DAILY. 90 tablet 0  . Potassium 99 MG TABS Take 5 tablets by mouth daily.    . rivaroxaban (XARELTO) 20 MG TABS tablet Take 1 tablet (20 mg total) by mouth daily with supper. 30 tablet 5  . rosuvastatin (CRESTOR) 40 MG tablet Take 1 tablet (40 mg total) by mouth daily. 90 tablet 1  . sertraline (ZOLOFT) 100 MG tablet Take 100 mg by mouth  daily.  11  . tamsulosin (FLOMAX) 0.4 MG CAPS capsule Take 1 capsule (0.4 mg total) by mouth 2 (two) times daily. 180 capsule 3  . oxyCODONE (ROXICODONE) 5 MG immediate release tablet Take 1-2 tablets (5-10 mg total) by mouth every 6 (six) hours as needed for severe pain (severe pain.). Do not drive with this medicine 16 tablet 0   No current facility-administered medications for this visit.    Allergies  Allergen Reactions  . Statins Other (See Comments)    Other reaction(s): Other All statin drugs Leg cramps when he was on atorvastatin and fenofibrate simultaneously. Discussed with his PCP Dr. Remus Blake on the phone [phone]) All statin drugs Leg cramps when he was on atorvastatin and fenofibrate simultaneously. Discussed with his PCP Dr. Remus Blake on the phone [phone]) All statin drugs   . Eliquis [Apixaban] Other (See Comments)    Feels weak      Discussed warning signs or symptoms. Please see discharge instructions. Patient expresses understanding.  I personally was present and performed or re-performed the history, physical exam and medical decision-making activities of this service and have verified that the service and findings are accurately documented in the student's note. ___________________________________________ Lynne Leader M.D., ABFM., CAQSM. Primary Care and Sports Medicine Adjunct Instructor of [employer]

## 2018-03-08 NOTE — Progress Notes (Signed)
Rx filled for Crestor. Patient had recent labs and were normal.

## 2018-03-08 NOTE — Patient Instructions (Addendum)
Thank you for coming in today. You should hear about CT scheduling soon.  Use oxycodone sparingly.  Use oxycodone mostly at bedtime.  Dont drive with this medicine.   Recheck if not improving.   Let me know if you do not hear about CT scan tomorrow.   If your belly pain worsens, or you have high fever, bad vomiting, blood in your stool or black tarry stool go to the Emergency Room.    Contusion A contusion is a deep bruise. Contusions are the result of a blunt injury to tissues and muscle fibers under the skin. The injury causes bleeding under the skin. The skin overlying the contusion may turn blue, purple, or yellow. Minor injuries will give you a painless contusion, but more severe contusions may stay painful and swollen for a few weeks. What are the causes? This condition is usually caused by a blow, trauma, or direct force to an area of the body. What are the signs or symptoms? Symptoms of this condition include:  Swelling of the injured area.  Pain and tenderness in the injured area.  Discoloration. The area may have redness and then turn blue, purple, or yellow.  How is this diagnosed? This condition is diagnosed based on a physical exam and medical history. An X-ray, CT scan, or MRI may be needed to determine if there are any associated injuries, such as broken bones (fractures). How is this treated? Specific treatment for this condition depends on what area of the body was injured. In general, the best treatment for a contusion is resting, icing, applying pressure to (compression), and elevating the injured area. This is often called the RICE strategy. Over-the-counter anti-inflammatory medicines may also be recommended for pain control. Follow these instructions at home:  Rest the injured area.  If directed, apply ice to the injured area: ? Put ice in a plastic bag. ? Place a towel between your skin and the bag. ? Leave the ice on for 20 minutes, 2-3 times per  day.  If directed, apply light compression to the injured area using an elastic bandage. Make sure the bandage is not wrapped too tightly. Remove and reapply the bandage as directed by your health care provider.  If possible, raise (elevate) the injured area above the level of your heart while you are sitting or lying down.  Take over-the-counter and prescription medicines only as told by your health care provider. Contact a health care provider if:  Your symptoms do not improve after several days of treatment.  Your symptoms get worse.  You have difficulty moving the injured area. Get help right away if:  You have severe pain.  You have numbness in a hand or foot.  Your hand or foot turns pale or cold. This information is not intended to replace advice given to you by your health care provider. Make sure you discuss any questions you have with your health care provider. Document Released: 01/26/2005 Document Revised: 08/27/2015 Document Reviewed: 09/03/2014 Elsevier Interactive Patient Education  [employer].

## 2018-03-10 ENCOUNTER — Ambulatory Visit (HOSPITAL_BASED_OUTPATIENT_CLINIC_OR_DEPARTMENT_OTHER)
Admission: RE | Admit: 2018-03-10 | Discharge: 2018-03-10 | Disposition: A | Discharge status: Home / Self Care | Payer: Medicare Other | Source: Ambulatory Visit | Attending: Family Medicine | Admitting: Family Medicine

## 2018-03-10 ENCOUNTER — Encounter (HOSPITAL_BASED_OUTPATIENT_CLINIC_OR_DEPARTMENT_OTHER): Payer: Self-pay

## 2018-03-10 DIAGNOSIS — R1031 Right lower quadrant pain: Secondary | ICD-10-CM

## 2018-03-10 DIAGNOSIS — S3991XA Unspecified injury of abdomen, initial encounter: Secondary | ICD-10-CM | POA: Diagnosis not present

## 2018-03-10 DIAGNOSIS — X58XXXA Exposure to other specified factors, initial encounter: Secondary | ICD-10-CM | POA: Insufficient documentation

## 2018-03-10 DIAGNOSIS — K8020 Calculus of gallbladder without cholecystitis without obstruction: Secondary | ICD-10-CM | POA: Insufficient documentation

## 2018-03-10 DIAGNOSIS — N200 Calculus of kidney: Secondary | ICD-10-CM | POA: Insufficient documentation

## 2018-03-10 DIAGNOSIS — S2241XA Multiple fractures of ribs, right side, initial encounter for closed fracture: Secondary | ICD-10-CM | POA: Insufficient documentation

## 2018-03-10 DIAGNOSIS — S301XXA Contusion of abdominal wall, initial encounter: Secondary | ICD-10-CM

## 2018-03-10 DIAGNOSIS — R109 Unspecified abdominal pain: Secondary | ICD-10-CM | POA: Diagnosis not present

## 2018-03-10 MED ORDER — IOPAMIDOL (ISOVUE-300) INJECTION 61%
100.0000 mL | Freq: Once | INTRAVENOUS | Status: AC | PRN
  Administered 2018-03-10: 100 mL via INTRAVENOUS

## 2018-03-12 ENCOUNTER — Encounter: Payer: Self-pay | Admitting: Family Medicine

## 2018-03-12 DIAGNOSIS — S2239XA Fracture of one rib, unspecified side, initial encounter for closed fracture: Secondary | ICD-10-CM | POA: Insufficient documentation

## 2018-03-12 DIAGNOSIS — I700 Atherosclerosis of aorta: Secondary | ICD-10-CM | POA: Insufficient documentation

## 2018-03-23 ENCOUNTER — Other Ambulatory Visit: Payer: Self-pay | Admitting: Family Medicine

## 2018-03-23 DIAGNOSIS — H6123 Impacted cerumen, bilateral: Secondary | ICD-10-CM | POA: Diagnosis not present

## 2018-03-30 ENCOUNTER — Other Ambulatory Visit: Payer: Self-pay | Admitting: Family Medicine

## 2018-04-12 DIAGNOSIS — N401 Enlarged prostate with lower urinary tract symptoms: Secondary | ICD-10-CM | POA: Diagnosis not present

## 2018-04-12 DIAGNOSIS — R351 Nocturia: Secondary | ICD-10-CM | POA: Diagnosis not present

## 2018-04-12 DIAGNOSIS — R3912 Poor urinary stream: Secondary | ICD-10-CM | POA: Diagnosis not present

## 2018-04-12 DIAGNOSIS — N5201 Erectile dysfunction due to arterial insufficiency: Secondary | ICD-10-CM | POA: Diagnosis not present

## 2018-04-24 ENCOUNTER — Other Ambulatory Visit: Payer: Self-pay | Admitting: Family Medicine

## 2018-05-03 ENCOUNTER — Encounter: Payer: Self-pay | Admitting: Family Medicine

## 2018-05-18 ENCOUNTER — Other Ambulatory Visit: Payer: Self-pay | Admitting: Family Medicine

## 2018-05-22 ENCOUNTER — Ambulatory Visit: Payer: Medicare Other | Admitting: Family Medicine

## 2018-05-23 ENCOUNTER — Ambulatory Visit (INDEPENDENT_AMBULATORY_CARE_PROVIDER_SITE_OTHER): Payer: Medicare Other | Admitting: Family Medicine

## 2018-05-23 ENCOUNTER — Telehealth: Payer: Self-pay | Admitting: *Deleted

## 2018-05-23 ENCOUNTER — Encounter: Payer: Self-pay | Admitting: Family Medicine

## 2018-05-23 VITALS — BP 139/68 | HR 73 | Ht 70.08 in | Wt 260.0 lb

## 2018-05-23 DIAGNOSIS — I700 Atherosclerosis of aorta: Secondary | ICD-10-CM

## 2018-05-23 DIAGNOSIS — I10 Essential (primary) hypertension: Secondary | ICD-10-CM | POA: Diagnosis not present

## 2018-05-23 DIAGNOSIS — I480 Paroxysmal atrial fibrillation: Secondary | ICD-10-CM | POA: Diagnosis not present

## 2018-05-23 DIAGNOSIS — E118 Type 2 diabetes mellitus with unspecified complications: Secondary | ICD-10-CM | POA: Diagnosis not present

## 2018-05-23 DIAGNOSIS — E1165 Type 2 diabetes mellitus with hyperglycemia: Secondary | ICD-10-CM | POA: Diagnosis not present

## 2018-05-23 DIAGNOSIS — IMO0002 Reserved for concepts with insufficient information to code with codable children: Secondary | ICD-10-CM

## 2018-05-23 DIAGNOSIS — N529 Male erectile dysfunction, unspecified: Secondary | ICD-10-CM | POA: Diagnosis not present

## 2018-05-23 LAB — POCT GLYCOSYLATED HEMOGLOBIN (HGB A1C): Hemoglobin A1C: 7.9 % — AB (ref 4.0–5.6)

## 2018-05-23 MED ORDER — DAPAGLIFLOZIN PROPANEDIOL 10 MG PO TABS: 10.0000 mg | ORAL_TABLET | Freq: Every day | ORAL | 5 refills | Status: DC

## 2018-05-23 MED ORDER — SERTRALINE HCL 100 MG PO TABS: 100.0000 mg | ORAL_TABLET | Freq: Every day | ORAL | 3 refills | Status: DC

## 2018-05-23 NOTE — Telephone Encounter (Signed)
Spoke w/Katilyn she had to transfer me to the pharmacist [place]. She informed me that he has been getting this medication since 11/25/2017. His most recent refill was on 05/03/2018. She told me that there is NOT a generic for this medication. And that the only way he could be getting another tablet would be if he is getting this from some outside pharmacy or a mail order pharmacy.   She then told me the he last refilled the Eliquis last yr in June.Maryruth Eve, [place] Crocker, CMA

## 2018-05-23 NOTE — Progress Notes (Signed)
Subjective:    CC:   HPI:  Diabetes - no hypoglycemic events. No wounds or sores that are not healing well. No increased thirst or urination. Checking glucose at home. Taking medications as prescribed without any side effects. He liked the Farxigo but bc of cost he was changed to Actos. Also on glipizide and metformin as well.  He says since he has been off the [place] in the [place] he is noticed a big difference in his sugars.  He would like to really restart the [place].  He admits he has not been as active because of knee pain and he has really not been limiting his carbs.  In fact he has gained 10 pound since he was last here.  Hypertension- Pt denies chest pain, SOB, dizziness, or heart palpitations.  Taking meds as directed w/o problems.  Denies medication side effects.    Paroxysmal atrial fibrillation-currently on Xarelto.  There is evidently some confusion.  He thinks he has a brand version and a generic version and has been taking both.  Aortic atherosclerosis-no recent chest pain or shortness of breath.  Titus function-he did let me know that he did go see the urologist and they are discussing surgery but has been nervous about having to hold his blood thinner before surgery and whether or not he should do so.  Past medical history, Surgical history, Family history not pertinant except as noted below, Social history, Allergies, and medications have been entered into the medical record, reviewed, and corrections made.   Review of Systems: No fevers, chills, night sweats, weight loss, chest pain, or shortness of breath.   Objective:    General: Well Developed, well nourished, and in no acute distress.  Neuro: Alert and oriented x3, extra-ocular muscles intact, sensation grossly intact.  HEENT: Normocephalic, atraumatic  Skin: Warm and dry, no rashes. Cardiac: Regular rate and rhythm, no murmurs rubs or gallops, no lower extremity edema.  Respiratory: Clear to auscultation  bilaterally. Not using accessory muscles, speaking in full sentences.   Impression and Recommendations:   Diabetes - Uncontrolled. A1C of 7.9 today.   He wants to restart [place]. He felt like his sugars were much better on it.    HTN -  BP borderline. Will keep an eye on it.    Paroxysmal af-fib.  He cannot tell when he flips into atrial fibrillation or not.  We will need to call the pharmacy to verify what is going on with his Xarelto.  It sounds like he is getting a name brand and a generic brand which is a little confusing and I am not sure what is going on so we will have to call to verify this.  Aortic atherosclerosis -stable.  Asymptomatic.  Morbid obesity/BMI 37-discussed the importance of cutting back on carbs and weight loss.  He has gained a significant weight and since I last saw him we discussed how this directly impacts his blood sugar levels.

## 2018-05-23 NOTE — Patient Instructions (Signed)
If they need to hold your blood thinner for a couple of days for the surgery your risk for stroke is very small so I think it would be ok to do so.

## 2018-05-23 NOTE — Telephone Encounter (Signed)
Called pt and advised him of what was said. He stated that he was on Coumadin before he moved here from [place]. I told him to be sure that when he is putting his medications in his pill box to make sure that he is only placing 1 (one) of the Xarelto in each of the days. He stated that he would be mindful of this and thanked me for my help...Marland KitchenMarland KitchenElouise Munroe, [place]

## 2018-05-26 ENCOUNTER — Other Ambulatory Visit: Payer: Self-pay | Admitting: Family Medicine

## 2018-05-30 ENCOUNTER — Other Ambulatory Visit: Payer: Self-pay | Admitting: Family Medicine

## 2018-06-06 ENCOUNTER — Ambulatory Visit: Payer: Medicare Other | Admitting: Family Medicine

## 2018-06-27 ENCOUNTER — Other Ambulatory Visit: Payer: Self-pay | Admitting: *Deleted

## 2018-06-27 DIAGNOSIS — N401 Enlarged prostate with lower urinary tract symptoms: Secondary | ICD-10-CM

## 2018-06-27 DIAGNOSIS — R351 Nocturia: Principal | ICD-10-CM

## 2018-06-27 MED ORDER — FINASTERIDE 5 MG PO TABS: ORAL_TABLET | ORAL | 1 refills | Status: DC

## 2018-07-09 ENCOUNTER — Other Ambulatory Visit: Payer: Self-pay | Admitting: *Deleted

## 2018-07-09 MED ORDER — DILTIAZEM HCL ER BEADS 120 MG PO CP24: ORAL_CAPSULE | ORAL | 1 refills | Status: DC

## 2018-08-13 ENCOUNTER — Other Ambulatory Visit: Payer: Self-pay | Admitting: Family Medicine

## 2018-08-22 ENCOUNTER — Ambulatory Visit (INDEPENDENT_AMBULATORY_CARE_PROVIDER_SITE_OTHER): Payer: Medicare Other | Admitting: Family Medicine

## 2018-08-22 ENCOUNTER — Encounter: Payer: Self-pay | Admitting: Family Medicine

## 2018-08-22 VITALS — Temp 95.3°F | Wt 250.0 lb

## 2018-08-22 DIAGNOSIS — E1165 Type 2 diabetes mellitus with hyperglycemia: Secondary | ICD-10-CM

## 2018-08-22 DIAGNOSIS — I10 Essential (primary) hypertension: Secondary | ICD-10-CM

## 2018-08-22 DIAGNOSIS — Z6835 Body mass index (BMI) 35.0-35.9, adult: Secondary | ICD-10-CM | POA: Diagnosis not present

## 2018-08-22 DIAGNOSIS — I480 Paroxysmal atrial fibrillation: Secondary | ICD-10-CM

## 2018-08-22 DIAGNOSIS — E669 Obesity, unspecified: Secondary | ICD-10-CM | POA: Insufficient documentation

## 2018-08-22 DIAGNOSIS — E118 Type 2 diabetes mellitus with unspecified complications: Secondary | ICD-10-CM

## 2018-08-22 DIAGNOSIS — IMO0002 Reserved for concepts with insufficient information to code with codable children: Secondary | ICD-10-CM

## 2018-08-22 NOTE — Progress Notes (Signed)
Virtual Visit via Video Note  I connected with [facility] on 08/22/18 at  8:10 AM EDT by a video enabled telemedicine application and verified that I am speaking with the correct person using two identifiers.   I discussed the limitations of evaluation and management by telemedicine and the availability of in person appointments. The patient expressed understanding and agreed to proceed.  Patient at home for visit and was in my office.  Subjective:    CC: DM  HPI:   Diabetes - no hypoglycemic events. No wounds or sores that are not healing well. No increased thirst or urination. Checking glucose at home. Taking medications as prescribed without any side effects. Has been eating more. 160 - 200.   Hypertension- Pt denies chest pain, SOB, dizziness, or heart palpitations.  Taking meds as directed w/o problems.  Denies medication side effects.    Atrial fibrilation - doing well overall. He is on Xerelto.     Past medical history, Surgical history, Family history not pertinant except as noted below, Social history, Allergies, and medications have been entered into the medical record, reviewed, and corrections made.   Review of Systems: No fevers, chills, night sweats, weight loss, chest pain, or shortness of breath.   Objective:    General: Speaking clearly in complete sentences without any shortness of breath.  Alert and oriented x3.  Normal judgment. No apparent acute distress.  Well-groomed.    Impression and Recommendations:    DM - blood sugars up up. WE discussed cutting back on sweets. He has some major dietary indescretions.  We did discuss that if he does not get back on track with his diet and get his blood sugars back down that the next option is insulin.  He is really maxed out completely on his medications and there really is not any additional pill that we could add that would be helpful for him.  Due for urine microalbumin.  HTN - due for BMP.  Has not been able to  check blood pressure at home so we will need to monitor carefully we will plan to see him back in 3 months we will check at that time.  Afib - he id doing well.  Asymptomatic currently.  He is taking his Xarelto consistently.  He did let me know that he decided not to have surgery felt like it was too risky.  Obesity/BMI 35- he has lost some weight.  We discussed really cutting back on sweets and getting more active.  He does golf a couple times a week.  I really would like for him to get his weight back down into the 240 range.      I discussed the assessment and treatment plan with the patient. The patient was provided an opportunity to ask questions and all were answered. The patient agreed with the plan and demonstrated an understanding of the instructions.   The patient was advised to call back or seek an in-person evaluation if the symptoms worsen or if the condition fails to improve as anticipated.   Beatrice Lecher, MD

## 2018-08-23 DIAGNOSIS — I480 Paroxysmal atrial fibrillation: Secondary | ICD-10-CM | POA: Diagnosis not present

## 2018-08-23 DIAGNOSIS — I10 Essential (primary) hypertension: Secondary | ICD-10-CM | POA: Diagnosis not present

## 2018-08-23 DIAGNOSIS — E118 Type 2 diabetes mellitus with unspecified complications: Secondary | ICD-10-CM | POA: Diagnosis not present

## 2018-08-23 DIAGNOSIS — E1165 Type 2 diabetes mellitus with hyperglycemia: Secondary | ICD-10-CM | POA: Diagnosis not present

## 2018-08-24 LAB — BASIC METABOLIC PANEL WITH GFR
BUN: 22 mg/dL (ref 7–25)
CO2: 26 mmol/L (ref 20–32)
Calcium: 9.8 mg/dL (ref 8.6–10.3)
Chloride: 103 mmol/L (ref 98–110)
Creat: 1.12 mg/dL (ref 0.70–1.18)
GFR, Est African American: 74 mL/min/{1.73_m2} (ref >=60)
GFR, Est Non African American: 63 mL/min/{1.73_m2} (ref >=60)
Glucose, Bld: 183 mg/dL — ABNORMAL HIGH (ref 65–99)
Potassium: 4.5 mmol/L (ref 3.5–5.3)
Sodium: 138 mmol/L (ref 135–146)

## 2018-08-24 LAB — MICROALBUMIN / CREATININE URINE RATIO
Creatinine, Urine: 53 mg/dL (ref 20–320)
Microalb Creat Ratio: 58 mcg/mg creat — ABNORMAL HIGH (ref <30)
Microalb, Ur: 3.1 mg/dL

## 2018-08-24 LAB — HEMOGLOBIN A1C
Hgb A1c MFr Bld: 7.7 % of total Hgb — ABNORMAL HIGH (ref <5.7)
Mean Plasma Glucose: 174 (calc)
eAG (mmol/L): 9.7 (calc)

## 2018-08-27 ENCOUNTER — Other Ambulatory Visit: Payer: Self-pay | Admitting: Family Medicine

## 2018-09-11 ENCOUNTER — Other Ambulatory Visit: Payer: Self-pay | Admitting: Family Medicine

## 2018-09-18 ENCOUNTER — Telehealth: Payer: Self-pay

## 2018-09-18 MED ORDER — GABAPENTIN 300 MG PO CAPS: 600.0000 mg | ORAL_CAPSULE | Freq: Two times a day (BID) | ORAL | 1 refills | Status: DC

## 2018-09-18 NOTE — Telephone Encounter (Signed)
Pt advised.

## 2018-09-18 NOTE — Telephone Encounter (Signed)
OK, new rx sent.  

## 2018-09-18 NOTE — Telephone Encounter (Signed)
Tristan Mayer states he takes Gabapentin 300mg  1 in the morning and 2 in the evening. He wanted to know if he can increase the amount to 300 mg 2 capsules in the morning and 2 capsules in the evening. He states it helps with is knee pain. Please advise.

## 2018-10-10 ENCOUNTER — Other Ambulatory Visit: Payer: Self-pay | Admitting: Family Medicine

## 2018-10-13 ENCOUNTER — Other Ambulatory Visit: Payer: Self-pay | Admitting: Family Medicine

## 2018-10-28 ENCOUNTER — Other Ambulatory Visit: Payer: Self-pay | Admitting: Family Medicine

## 2018-12-02 ENCOUNTER — Other Ambulatory Visit: Payer: Self-pay | Admitting: Family Medicine

## 2018-12-10 ENCOUNTER — Other Ambulatory Visit: Payer: Self-pay | Admitting: Family Medicine

## 2018-12-30 ENCOUNTER — Other Ambulatory Visit: Payer: Self-pay | Admitting: Family Medicine

## 2019-01-03 ENCOUNTER — Other Ambulatory Visit: Payer: Self-pay | Admitting: Family Medicine

## 2019-01-03 DIAGNOSIS — N401 Enlarged prostate with lower urinary tract symptoms: Secondary | ICD-10-CM

## 2019-01-14 ENCOUNTER — Telehealth: Payer: Self-pay | Admitting: Family Medicine

## 2019-01-14 DIAGNOSIS — R351 Nocturia: Secondary | ICD-10-CM

## 2019-01-14 DIAGNOSIS — N401 Enlarged prostate with lower urinary tract symptoms: Secondary | ICD-10-CM

## 2019-01-14 MED ORDER — PIOGLITAZONE HCL 30 MG PO TABS: 30.0000 mg | ORAL_TABLET | Freq: Every day | ORAL | 0 refills | Status: DC

## 2019-01-14 MED ORDER — METFORMIN HCL 1000 MG PO TABS: ORAL_TABLET | ORAL | 0 refills | Status: DC

## 2019-01-14 MED ORDER — ROSUVASTATIN CALCIUM 40 MG PO TABS: 40.0000 mg | ORAL_TABLET | Freq: Every day | ORAL | 0 refills | Status: DC

## 2019-01-14 MED ORDER — GLIPIZIDE 10 MG PO TABS: ORAL_TABLET | ORAL | 0 refills | Status: DC

## 2019-01-14 MED ORDER — RIVAROXABAN 20 MG PO TABS: ORAL_TABLET | ORAL | 0 refills | Status: DC

## 2019-01-14 MED ORDER — DILTIAZEM HCL ER BEADS 120 MG PO CP24: ORAL_CAPSULE | ORAL | 0 refills | Status: DC

## 2019-01-14 MED ORDER — FINASTERIDE 5 MG PO TABS: ORAL_TABLET | ORAL | 0 refills | Status: DC

## 2019-01-14 NOTE — Telephone Encounter (Signed)
Left voicemail for pt to call back to schedule.

## 2019-01-14 NOTE — Telephone Encounter (Signed)
I have sent in 30 day of his medications that have been requested by the pharmacy. Per Dr. Jerilynn Mages can you please call the patient and get him scheduled? Please advise.

## 2019-01-15 DIAGNOSIS — D1801 Hemangioma of skin and subcutaneous tissue: Secondary | ICD-10-CM | POA: Diagnosis not present

## 2019-01-15 DIAGNOSIS — D485 Neoplasm of uncertain behavior of skin: Secondary | ICD-10-CM | POA: Diagnosis not present

## 2019-01-15 DIAGNOSIS — Z08 Encounter for follow-up examination after completed treatment for malignant neoplasm: Secondary | ICD-10-CM | POA: Diagnosis not present

## 2019-01-15 DIAGNOSIS — Z85828 Personal history of other malignant neoplasm of skin: Secondary | ICD-10-CM | POA: Diagnosis not present

## 2019-01-15 DIAGNOSIS — L570 Actinic keratosis: Secondary | ICD-10-CM | POA: Diagnosis not present

## 2019-01-18 DIAGNOSIS — R238 Other skin changes: Secondary | ICD-10-CM | POA: Diagnosis not present

## 2019-01-18 DIAGNOSIS — D2222 Melanocytic nevi of left ear and external auricular canal: Secondary | ICD-10-CM | POA: Diagnosis not present

## 2019-01-20 ENCOUNTER — Other Ambulatory Visit: Payer: Self-pay | Admitting: Family Medicine

## 2019-02-12 DIAGNOSIS — D2222 Melanocytic nevi of left ear and external auricular canal: Secondary | ICD-10-CM | POA: Diagnosis not present

## 2019-02-25 ENCOUNTER — Encounter: Payer: Self-pay | Admitting: Family Medicine

## 2019-02-25 ENCOUNTER — Other Ambulatory Visit: Payer: Self-pay

## 2019-02-25 ENCOUNTER — Ambulatory Visit (INDEPENDENT_AMBULATORY_CARE_PROVIDER_SITE_OTHER): Payer: Medicare Other | Admitting: Family Medicine

## 2019-02-25 VITALS — BP 130/52 | HR 74 | Ht 70.0 in | Wt 251.0 lb

## 2019-02-25 DIAGNOSIS — I480 Paroxysmal atrial fibrillation: Secondary | ICD-10-CM

## 2019-02-25 DIAGNOSIS — I10 Essential (primary) hypertension: Secondary | ICD-10-CM | POA: Diagnosis not present

## 2019-02-25 DIAGNOSIS — IMO0002 Reserved for concepts with insufficient information to code with codable children: Secondary | ICD-10-CM

## 2019-02-25 DIAGNOSIS — Z23 Encounter for immunization: Secondary | ICD-10-CM | POA: Diagnosis not present

## 2019-02-25 DIAGNOSIS — E118 Type 2 diabetes mellitus with unspecified complications: Secondary | ICD-10-CM

## 2019-02-25 DIAGNOSIS — N401 Enlarged prostate with lower urinary tract symptoms: Secondary | ICD-10-CM

## 2019-02-25 DIAGNOSIS — R351 Nocturia: Secondary | ICD-10-CM | POA: Diagnosis not present

## 2019-02-25 DIAGNOSIS — E1165 Type 2 diabetes mellitus with hyperglycemia: Secondary | ICD-10-CM

## 2019-02-25 LAB — POCT GLYCOSYLATED HEMOGLOBIN (HGB A1C): Hemoglobin A1C: 8.6 % — AB (ref 4.0–5.6)

## 2019-02-25 MED ORDER — PEN NEEDLES 32G X 5 MM MISC: 1.0000 [IU] | Freq: Every day | 1 refills | Status: DC

## 2019-02-25 MED ORDER — ROSUVASTATIN CALCIUM 40 MG PO TABS: 40.0000 mg | ORAL_TABLET | Freq: Every day | ORAL | 3 refills | Status: DC

## 2019-02-25 MED ORDER — GLIPIZIDE 10 MG PO TABS: ORAL_TABLET | ORAL | 1 refills | Status: DC

## 2019-02-25 MED ORDER — FINASTERIDE 5 MG PO TABS: ORAL_TABLET | ORAL | 1 refills | Status: DC

## 2019-02-25 MED ORDER — RIVAROXABAN 20 MG PO TABS: ORAL_TABLET | ORAL | 1 refills | Status: DC

## 2019-02-25 MED ORDER — DILTIAZEM HCL ER BEADS 120 MG PO CP24: ORAL_CAPSULE | ORAL | 1 refills | Status: DC

## 2019-02-25 MED ORDER — TRESIBA FLEXTOUCH 100 UNIT/ML ~~LOC~~ SOPN: 10.0000 [IU] | PEN_INJECTOR | Freq: Every day | SUBCUTANEOUS | 1 refills | Status: DC

## 2019-02-25 NOTE — Assessment & Plan Note (Signed)
Doing well on current regimen.  Needs refills for 90 days sent to the pharmacy.

## 2019-02-25 NOTE — Progress Notes (Signed)
Established Patient Office Visit  Subjective:  Patient ID: Tristan Mayer, male    DOB: 01/23/1941  Age: 78 y.o. MRN: [id number]  CC:  Chief Complaint  Patient presents with  . Diabetes    HPI Darrence Siegman presents for  Diabetes - no hypoglycemic events. No wounds or sores that are not healing well. No increased thirst or urination. Checking glucose at home. Taking medications as prescribed without any side effects.  He is currently on Metformin, glipizide, Actos and [place].  He says his blood sugars at home of been running 175 all the way up to 225.  He is due for foot exam today.  Today for example he had a hamburger [place] fries and a Coke for lunch.  He says normally for breakfast he will have either Cheerios or eggs bacon and toast.  Hypertension- Pt denies chest pain, SOB, dizziness, or heart palpitations.  Taking meds as directed w/o problems.  Denies medication side effects.    Atrial Fibrillation-he says the Xarelto is costing him more than $100 a month.  He does have knee arthritis.  He has been using Tylenol and blue emu.  Has tried glucosamine but says it really was not that helpful.  Past Medical History:  Diagnosis Date  . Atrial fibrillation ([place])   . CVA (cerebral vascular accident) ([place])   . Diabetes mellitus ([place])   . GERD (gastroesophageal reflux disease)   . Hyperlipidemia   . PUD (peptic ulcer disease)     Past Surgical History:  Procedure Laterality Date  . CATARACT EXTRACTION W/ INTRAOCULAR LENS IMPLANT Right     Family History  Problem Relation Age of Onset  . Prostate cancer Father 25  . Stroke Father   . Diabetes Brother   . Diabetes Brother   . Atrial fibrillation Mother     Social History   Socioeconomic History  . Marital status: Married    Spouse name: [name]  . Number of children: 3  . Years of education: Not on file  . Highest education level: 12th grade  Occupational History  . Occupation: part time driver     Comment: [employer]  . Financial resource strain: Not on file  . Food insecurity    Worry: Not on file    Inability: Not on file  . Transportation needs    Medical: Not on file    Non-medical: Not on file  Tobacco Use  . Smoking status: Current Some Day Smoker    Types: Cigars  . Smokeless tobacco: Never Used  Substance and Sexual Activity  . Alcohol use: Yes    Comment: Occasional  . Drug use: Not on file  . Sexual activity: Not on file  Lifestyle  . Physical activity    Days per week: Not on file    Minutes per session: Not on file  . Stress: Not on file  Relationships  . Social Herbalist on phone: Not on file    Gets together: Not on file    Attends religious service: Not on file    Active member of club or organization: Not on file    Attends meetings of clubs or organizations: Not on file    Relationship status: Not on file  . Intimate partner violence    Fear of current or ex partner: Not on file    Emotionally abused: Not on file    Physically abused: Not on file  Forced sexual activity: Not on file  Other Topics Concern  . Not on file  Social History Narrative  . Not on file    Outpatient Medications Prior to Visit  Medication Sig Dispense Refill  . ACCU-CHEK GUIDE test strip     . allopurinol (ZYLOPRIM) 300 MG tablet Take 1 tablet by mouth daily.    . AMBULATORY NON FORMULARY MEDICATION Take 1 Scoop by mouth daily. Medication Name: Vital Reds 3.7grms daily    . Blood Glucose Calibration (ACCU-CHEK GUIDE CONTROL) LIQD     . Co-Enzyme Q-10 30 MG CAPS Take 400 mg by mouth daily.    Marland Kitchen ECHINACEA PO Take 760 mg by mouth as needed.    . famotidine (PEPCID) 20 MG tablet Take 20 mg by mouth daily.    Marland Kitchen gabapentin (NEURONTIN) 300 MG capsule Take 2 capsules (600 mg total) by mouth 2 (two) times daily. 360 capsule 1  . Melatonin 10 MG TABS Take 1 tablet by mouth daily.    . metFORMIN (GLUCOPHAGE) 1000 MG tablet TAKE 1 TABLET BY MOUTH TWICE DAILY  WITH MEALS 180 tablet 0  . Potassium 99 MG TABS Take 5 tablets by mouth daily.    . sertraline (ZOLOFT) 100 MG tablet Take 1 tablet (100 mg total) by mouth daily. 90 tablet 3  . tamsulosin (FLOMAX) 0.4 MG CAPS capsule Take 1 capsule by mouth twice daily 180 capsule 3  . Ultra Thin Lancets 31G MISC     . diltiazem (TIAZAC) 120 MG 24 hr capsule Take 1 capsule by mouth once daily 30 capsule 0  . FARXIGA 10 MG TABS tablet Take 1 tablet by mouth once daily 150 tablet 0  . finasteride (PROSCAR) 5 MG tablet TAKE 1 TABLET BY MOUTH AT BEDTIME 30 tablet 0  . glipiZIDE (GLUCOTROL) 10 MG tablet TAKE 1 TABLET BY MOUTH TWICE DAILY BEFORE MEAL(S) 60 tablet 0  . pioglitazone (ACTOS) 30 MG tablet Take 1 tablet (30 mg total) by mouth daily. 30 tablet 0  . rivaroxaban (XARELTO) 20 MG TABS tablet TAKE 1 TABLET BY MOUTH ONCE DAILY WITH SUPPER 30 tablet 0  . rosuvastatin (CRESTOR) 40 MG tablet Take 1 tablet (40 mg total) by mouth daily. 30 tablet 0  . AMBULATORY NON FORMULARY MEDICATION     . isosorbide mononitrate (IMDUR) 30 MG 24 hr tablet     . nitroGLYCERIN (NITROSTAT) 0.4 MG SL tablet     . nystatin-triamcinolone ointment (MYCOLOG) Apply 1 application topically 2 (two) times daily. 45 g 1   No facility-administered medications prior to visit.     Allergies  Allergen Reactions  . Statins Other (See Comments)    Other reaction(s): Other All statin drugs Leg cramps when he was on atorvastatin and fenofibrate simultaneously. Discussed with his PCP Dr. Remus Blake on the phone [phone]) All statin drugs Leg cramps when he was on atorvastatin and fenofibrate simultaneously. Discussed with his PCP Dr. Remus Blake on the phone [phone]) All statin drugs   . Eliquis [Apixaban] Other (See Comments)    Feels weak     ROS Review of Systems    Objective:    Physical Exam  Constitutional: He is oriented to person, place, and time. He appears well-developed and well-nourished.  HENT:  Head: Normocephalic  and atraumatic.  Cardiovascular: Normal rate, regular rhythm and normal heart sounds.  Pulmonary/Chest: Effort normal and breath sounds normal.  Neurological: He is alert and oriented to person, place, and time.  Skin: Skin is warm and dry.  Psychiatric: He has a normal mood and affect. His behavior is normal.    BP (!) 130/52   Pulse 74   Ht 5\' 10"  (1.778 m)   Wt 251 lb (113.9 kg)   SpO2 98%   BMI 36.01 kg/m  Wt Readings from Last 3 Encounters:  02/25/19 251 lb (113.9 kg)  08/22/18 250 lb (113.4 kg)  05/23/18 260 lb (117.9 kg)     There are no preventive care reminders to display for this patient.  There are no preventive care reminders to display for this patient.  No results found for: TSH Lab Results  Component Value Date   WBC 6.6 09/20/2017   HGB 14.1 09/20/2017   HCT 40.9 09/20/2017   MCV 85.9 09/20/2017   PLT 179 09/20/2017   Lab Results  Component Value Date   NA 138 08/23/2018   K 4.5 08/23/2018   CO2 26 08/23/2018   GLUCOSE 183 (H) 08/23/2018   BUN 22 08/23/2018   CREATININE 1.12 08/23/2018   BILITOT 0.9 02/28/2018   AST 19 02/28/2018   ALT 20 02/28/2018   PROT 6.2 02/28/2018   CALCIUM 9.8 08/23/2018   Lab Results  Component Value Date   CHOL 125 02/28/2018   Lab Results  Component Value Date   HDL 44 02/28/2018   Lab Results  Component Value Date   LDLCALC 60 02/28/2018   Lab Results  Component Value Date   TRIG 131 02/28/2018   Lab Results  Component Value Date   CHOLHDL 2.8 02/28/2018   Lab Results  Component Value Date   HGBA1C 8.6 (A) 02/25/2019      Assessment & Plan:   Problem List Items Addressed This Visit      Cardiovascular and Mediastinum   Paroxysmal A-fib ([place])    Continue Xarelto.  Refills sent for 6 months.      Relevant Medications   diltiazem (TIAZAC) 120 MG 24 hr capsule   rivaroxaban (XARELTO) 20 MG TABS tablet   rosuvastatin (CRESTOR) 40 MG tablet   Benign essential hypertension    Well  controlled. Continue current regimen. Follow up in  6 mo      Relevant Medications   diltiazem (TIAZAC) 120 MG 24 hr capsule   rivaroxaban (XARELTO) 20 MG TABS tablet   rosuvastatin (CRESTOR) 40 MG tablet     Endocrine   Uncontrolled diabetes mellitus with complication, without long-term current use of insulin ([place]) - Primary    We discussed the importance of dietary control and really cutting back on his carbs and sweets.  I really want him to only drink his coffee and water and to cut out all other beverages.  We also discussed trying to better control his blood sugars and give to actually discontinue his Actos and Wilder Glade which has been very expensive for him and working to start long-acting insulin.  We will start with [place].  And have him start with 10 units at bedtime and increase by 1 unit daily until his fasting blood sugars are under 130.  If he has a little bit of the Actos or Farxiga left he can certainly finish those knowing that as soon as he runs out his blood sugars will likely go up and will probably need to adjust his regimen again.  Otherwise follow-up in 4 weeks.      Relevant Medications   insulin degludec (TRESIBA FLEXTOUCH) 100 UNIT/ML SOPN FlexTouch Pen   glipiZIDE (GLUCOTROL) 10 MG tablet   rosuvastatin (CRESTOR) 40 MG  tablet   Other Relevant Orders   POCT glycosylated hemoglobin (Hb A1C) (Completed)     Genitourinary   BPH (benign prostatic hyperplasia)    Doing well on current regimen.  Needs refills for 90 days sent to the pharmacy.      Relevant Medications   finasteride (PROSCAR) 5 MG tablet    Other Visit Diagnoses    Need for immunization against influenza       Relevant Orders   Flu Vaccine QUAD High Dose(Fluad) (Completed)      Meds ordered this encounter  Medications  . insulin degludec (TRESIBA FLEXTOUCH) 100 UNIT/ML SOPN FlexTouch Pen    Sig: Inject 0.1-0.3 mLs (10-30 Units total) into the skin daily.    Dispense:  6 mL    Refill:  1  .  glipiZIDE (GLUCOTROL) 10 MG tablet    Sig: TAKE 1 TABLET BY MOUTH TWICE DAILY BEFORE MEAL(S)    Dispense:  180 tablet    Refill:  1  . finasteride (PROSCAR) 5 MG tablet    Sig: TAKE 1 TABLET BY MOUTH AT BEDTIME    Dispense:  90 tablet    Refill:  1  . Insulin Pen Needle (PEN NEEDLES) 32G X 5 MM MISC    Sig: 1 Units by Does not apply route daily.    Dispense:  100 each    Refill:  1  . diltiazem (TIAZAC) 120 MG 24 hr capsule    Sig: Take 1 capsule by mouth once daily    Dispense:  90 capsule    Refill:  1  . rivaroxaban (XARELTO) 20 MG TABS tablet    Sig: TAKE 1 TABLET BY MOUTH ONCE DAILY WITH SUPPER    Dispense:  90 tablet    Refill:  1  . rosuvastatin (CRESTOR) 40 MG tablet    Sig: Take 1 tablet (40 mg total) by mouth daily.    Dispense:  90 tablet    Refill:  3    Follow-up: Return in about 4 weeks (around 03/25/2019) for Diabetes follow-up.    Beatrice Lecher, MD

## 2019-02-25 NOTE — Assessment & Plan Note (Signed)
We discussed the importance of dietary control and really cutting back on his carbs and sweets.  I really want him to only drink his coffee and water and to cut out all other beverages.  We also discussed trying to better control his blood sugars and give to actually discontinue his Actos and Wilder Glade which has been very expensive for him and working to start long-acting insulin.  We will start with [place].  And have him start with 10 units at bedtime and increase by 1 unit daily until his fasting blood sugars are under 130.  If he has a little bit of the Actos or Farxiga left he can certainly finish those knowing that as soon as he runs out his blood sugars will likely go up and will probably need to adjust his regimen again.  Otherwise follow-up in 4 weeks.

## 2019-02-25 NOTE — Assessment & Plan Note (Signed)
Continue Xarelto.  Refills sent for 6 months.

## 2019-02-25 NOTE — Patient Instructions (Addendum)
Go up on your insulin by one unit each night until your sugar in the morning is less than 130. Then stay at that dose each night.  Cut back on your potatotes.   OK to stop your Actos and [place] Glade when you run out of what you have

## 2019-02-25 NOTE — Assessment & Plan Note (Signed)
Well controlled. Continue current regimen. Follow up in  6 mo  

## 2019-03-04 ENCOUNTER — Other Ambulatory Visit: Payer: Self-pay | Admitting: *Deleted

## 2019-03-04 MED ORDER — GABAPENTIN 300 MG PO CAPS: 600.0000 mg | ORAL_CAPSULE | Freq: Two times a day (BID) | ORAL | 1 refills | Status: DC

## 2019-03-26 ENCOUNTER — Ambulatory Visit (INDEPENDENT_AMBULATORY_CARE_PROVIDER_SITE_OTHER): Payer: Medicare Other | Admitting: Family Medicine

## 2019-03-26 ENCOUNTER — Other Ambulatory Visit: Payer: Self-pay

## 2019-03-26 ENCOUNTER — Encounter: Payer: Self-pay | Admitting: Family Medicine

## 2019-03-26 VITALS — BP 133/74 | HR 70 | Ht 70.0 in | Wt 250.0 lb

## 2019-03-26 DIAGNOSIS — IMO0002 Reserved for concepts with insufficient information to code with codable children: Secondary | ICD-10-CM

## 2019-03-26 DIAGNOSIS — E118 Type 2 diabetes mellitus with unspecified complications: Secondary | ICD-10-CM | POA: Diagnosis not present

## 2019-03-26 DIAGNOSIS — E1165 Type 2 diabetes mellitus with hyperglycemia: Secondary | ICD-10-CM

## 2019-03-26 DIAGNOSIS — Z6835 Body mass index (BMI) 35.0-35.9, adult: Secondary | ICD-10-CM

## 2019-03-26 DIAGNOSIS — Z6831 Body mass index (BMI) 31.0-31.9, adult: Secondary | ICD-10-CM | POA: Insufficient documentation

## 2019-03-26 DIAGNOSIS — Z6836 Body mass index (BMI) 36.0-36.9, adult: Secondary | ICD-10-CM | POA: Insufficient documentation

## 2019-03-26 MED ORDER — TRESIBA FLEXTOUCH 100 UNIT/ML ~~LOC~~ SOPN: 40.0000 [IU] | PEN_INJECTOR | Freq: Every day | SUBCUTANEOUS | 1 refills | Status: DC

## 2019-03-26 NOTE — Patient Instructions (Addendum)
Increase you [name] shot to 36 units for 2 days and then increase by 1 unit every other day until your sugar are less than 130.   Work on cutting back on your soda and sweet tea.   Call back  In 2 weeks with your blood sugar numbers.

## 2019-03-26 NOTE — Assessment & Plan Note (Signed)
Again discussed making some major dietary changes.  Please see note above.  I feel like if we can get his diet more consistent and lose some weight it would make a big impact on his blood glucose levels.

## 2019-03-26 NOTE — Progress Notes (Signed)
Established Patient Office Visit  Subjective:  Patient ID: Tristan Mayer, male    DOB: 04-11-1941  Age: 78 y.o. MRN: [id number]  CC:  Chief Complaint  Patient presents with  . Diabetes    HPI Tristan Mayer presents for 6-week follow-up for diabetes.  When I last saw him he was having difficulty affording a lot of his medications and his diabetes was not well controlled.  We had a long discussion about some dietary changes and also decided to discontinue his Actos and Farxiga and start him on long-acting insulin.  Sample was provided for [place].  Is now up to 28 units and his blood sugars have been running between 150 and 200.   We had started at 10 units and he has been going up 1 unit every other day for the last 6 weeks.  He says he has made some dietary changes he is trying to cut back on his soda but has not been able to cut it out completely he says it is actually been really hard.  He also try to go to unsweetened tea and says he was not able to do that either.  He has tried cutting back a little bit on his portions and says he feels like he has been successful with that.  He also has quit eating after 7 PM and has stuck to that pretty well.  Past Medical History:  Diagnosis Date  . Atrial fibrillation ([place])   . CVA (cerebral vascular accident) ([place])   . Diabetes mellitus ([place])   . GERD (gastroesophageal reflux disease)   . Hyperlipidemia   . PUD (peptic ulcer disease)     Past Surgical History:  Procedure Laterality Date  . CATARACT EXTRACTION W/ INTRAOCULAR LENS IMPLANT Right     Family History  Problem Relation Age of Onset  . Prostate cancer Father 56  . Stroke Father   . Diabetes Brother   . Diabetes Brother   . Atrial fibrillation Mother     Social History   Socioeconomic History  . Marital status: Married    Spouse name: [name]  . Number of children: 3  . Years of education: Not on file  . Highest education level: 12th grade  Occupational History  .  Occupation: part time driver     Comment: [employer]  . Financial resource strain: Not on file  . Food insecurity    Worry: Not on file    Inability: Not on file  . Transportation needs    Medical: Not on file    Non-medical: Not on file  Tobacco Use  . Smoking status: Current Some Day Smoker    Types: Cigars  . Smokeless tobacco: Never Used  Substance and Sexual Activity  . Alcohol use: Yes    Comment: Occasional  . Drug use: Not on file  . Sexual activity: Not on file  Lifestyle  . Physical activity    Days per week: Not on file    Minutes per session: Not on file  . Stress: Not on file  Relationships  . Social Herbalist on phone: Not on file    Gets together: Not on file    Attends religious service: Not on file    Active member of club or organization: Not on file    Attends meetings of clubs or organizations: Not on file    Relationship status: Not on file  . Intimate partner violence  Fear of current or ex partner: Not on file    Emotionally abused: Not on file    Physically abused: Not on file    Forced sexual activity: Not on file  Other Topics Concern  . Not on file  Social History Narrative  . Not on file    Outpatient Medications Prior to Visit  Medication Sig Dispense Refill  . ACCU-CHEK GUIDE test strip     . allopurinol (ZYLOPRIM) 300 MG tablet Take 1 tablet by mouth daily.    . AMBULATORY NON FORMULARY MEDICATION Take 1 Scoop by mouth daily. Medication Name: Vital Reds 3.7grms daily    . Blood Glucose Calibration (ACCU-CHEK GUIDE CONTROL) LIQD     . Co-Enzyme Q-10 30 MG CAPS Take 400 mg by mouth daily.    Marland Kitchen diltiazem (TIAZAC) 120 MG 24 hr capsule Take 1 capsule by mouth once daily 90 capsule 1  . ECHINACEA PO Take 760 mg by mouth as needed.    . famotidine (PEPCID) 20 MG tablet Take 20 mg by mouth daily.    . finasteride (PROSCAR) 5 MG tablet TAKE 1 TABLET BY MOUTH AT BEDTIME 90 tablet 1  . gabapentin  (NEURONTIN) 300 MG capsule Take 2 capsules (600 mg total) by mouth 2 (two) times daily. 360 capsule 1  . glipiZIDE (GLUCOTROL) 10 MG tablet TAKE 1 TABLET BY MOUTH TWICE DAILY BEFORE MEAL(S) 180 tablet 1  . Insulin Pen Needle (PEN NEEDLES) 32G X 5 MM MISC 1 Units by Does not apply route daily. 100 each 1  . Melatonin 10 MG TABS Take 1 tablet by mouth daily.    . metFORMIN (GLUCOPHAGE) 1000 MG tablet TAKE 1 TABLET BY MOUTH TWICE DAILY WITH MEALS 180 tablet 0  . pioglitazone (ACTOS) 30 MG tablet Take 30 mg by mouth daily.    . Potassium 99 MG TABS Take 5 tablets by mouth daily.    . rivaroxaban (XARELTO) 20 MG TABS tablet TAKE 1 TABLET BY MOUTH ONCE DAILY WITH SUPPER 90 tablet 1  . rosuvastatin (CRESTOR) 40 MG tablet Take 1 tablet (40 mg total) by mouth daily. 90 tablet 3  . sertraline (ZOLOFT) 100 MG tablet Take 1 tablet (100 mg total) by mouth daily. 90 tablet 3  . tamsulosin (FLOMAX) 0.4 MG CAPS capsule Take 1 capsule by mouth twice daily 180 capsule 3  . Ultra Thin Lancets 31G MISC     . insulin degludec (TRESIBA FLEXTOUCH) 100 UNIT/ML SOPN FlexTouch Pen Inject 0.1-0.3 mLs (10-30 Units total) into the skin daily. 6 mL 1   No facility-administered medications prior to visit.     Allergies  Allergen Reactions  . Statins Other (See Comments)    Other reaction(s): Other All statin drugs Leg cramps when he was on atorvastatin and fenofibrate simultaneously. Discussed with his PCP Dr. Remus Blake on the phone [phone]) All statin drugs Leg cramps when he was on atorvastatin and fenofibrate simultaneously. Discussed with his PCP Dr. Remus Blake on the phone [phone]) All statin drugs   . Eliquis [Apixaban] Other (See Comments)    Feels weak     ROS Review of Systems    Objective:    Physical Exam  Constitutional: He is oriented to person, place, and time. He appears well-developed and well-nourished.  HENT:  Head: Normocephalic and atraumatic.  Cardiovascular: Normal rate, regular  rhythm and normal heart sounds.  Pulmonary/Chest: Effort normal and breath sounds normal.  Neurological: He is alert and oriented to person, place, and time.  Skin: Skin  is warm and dry.  Psychiatric: He has a normal mood and affect. His behavior is normal.    BP 133/74   Pulse 70   Ht 5\' 10"  (1.778 m)   Wt 250 lb (113.4 kg)   SpO2 98%   BMI 35.87 kg/m  Wt Readings from Last 3 Encounters:  03/26/19 250 lb (113.4 kg)  02/25/19 251 lb (113.9 kg)  08/22/18 250 lb (113.4 kg)     There are no preventive care reminders to display for this patient.  There are no preventive care reminders to display for this patient.  No results found for: TSH Lab Results  Component Value Date   WBC 6.6 09/20/2017   HGB 14.1 09/20/2017   HCT 40.9 09/20/2017   MCV 85.9 09/20/2017   PLT 179 09/20/2017   Lab Results  Component Value Date   NA 138 08/23/2018   K 4.5 08/23/2018   CO2 26 08/23/2018   GLUCOSE 183 (H) 08/23/2018   BUN 22 08/23/2018   CREATININE 1.12 08/23/2018   BILITOT 0.9 02/28/2018   AST 19 02/28/2018   ALT 20 02/28/2018   PROT 6.2 02/28/2018   CALCIUM 9.8 08/23/2018   Lab Results  Component Value Date   CHOL 125 02/28/2018   Lab Results  Component Value Date   HDL 44 02/28/2018   Lab Results  Component Value Date   LDLCALC 60 02/28/2018   Lab Results  Component Value Date   TRIG 131 02/28/2018   Lab Results  Component Value Date   CHOLHDL 2.8 02/28/2018   Lab Results  Component Value Date   HGBA1C 8.6 (A) 02/25/2019      Assessment & Plan:   Problem List Items Addressed This Visit      Endocrine   Uncontrolled diabetes mellitus with complication, without long-term current use of insulin ([place]) - Primary    Increase Tresiba to 36 units.  Now off of [place]ade but he still has about 2 more weeks of Actos obese, finish that out and then he will come off so did warn him that that could cause an increase in his blood sugars once he does come off.  So far he  has made some good changes just really encouraged him to continue to work at cutting out soda.  And instead of drinking sweet tea from restaurants at least asked her half-and-half if he cannot go to unsweet.  Continue to work on portion control.      Relevant Medications   pioglitazone (ACTOS) 30 MG tablet   insulin degludec (TRESIBA FLEXTOUCH) 100 UNIT/ML SOPN FlexTouch Pen     Other   Severe obesity (BMI 35.0-35.9 with comorbidity) ([place])    Again discussed making some major dietary changes.  Please see note above.  I feel like if we can get his diet more consistent and lose some weight it would make a big impact on his blood glucose levels.      Relevant Medications   pioglitazone (ACTOS) 30 MG tablet   insulin degludec (TRESIBA FLEXTOUCH) 100 UNIT/ML SOPN FlexTouch Pen   BMI 35.0-35.9,adult      Meds ordered this encounter  Medications  . insulin degludec (TRESIBA FLEXTOUCH) 100 UNIT/ML SOPN FlexTouch Pen    Sig: Inject 0.4-0.6 mLs (40-60 Units total) into the skin daily.    Dispense:  9 mL    Refill:  1   Time spent 20 minutes, greater than 50% of the time spent face-to-face counseling about diabetes and diet.  Follow-up:  Return in about 6 weeks (around 05/07/2019) for Diabetes follow-up.    Beatrice Lecher, MD

## 2019-03-26 NOTE — Assessment & Plan Note (Signed)
Increase Tresiba to 36 units.  Now off of [place] but he still has about 2 more weeks of Actos obese, finish that out and then he will come off so did warn him that that could cause an increase in his blood sugars once he does come off.  So far he has made some good changes just really encouraged him to continue to work at cutting out soda.  And instead of drinking sweet tea from restaurants at least asked her half-and-half if he cannot go to unsweet.  Continue to work on portion control.

## 2019-04-19 LAB — HM DIABETES EYE EXAM

## 2019-04-23 ENCOUNTER — Telehealth: Payer: Self-pay | Admitting: Family Medicine

## 2019-04-23 NOTE — Telephone Encounter (Signed)
Increase Tresiba to 54 units daily and track sugars for one week.

## 2019-04-23 NOTE — Telephone Encounter (Signed)
Patient advised of recommendations.  

## 2019-04-23 NOTE — Telephone Encounter (Signed)
Patient called and he has a follow up the first week in January. He reports that his sugars are running in the 230's and going up to 270's in the morning before eating and having his insulin. He is concerned and is taking 48 units a day of his [place]. Do you have any recommendations before his appointment 05/07/2019.

## 2019-05-07 ENCOUNTER — Other Ambulatory Visit: Payer: Self-pay

## 2019-05-07 ENCOUNTER — Telehealth: Payer: Self-pay | Admitting: Family Medicine

## 2019-05-07 ENCOUNTER — Other Ambulatory Visit: Payer: Self-pay | Admitting: Family Medicine

## 2019-05-07 ENCOUNTER — Encounter: Payer: Self-pay | Admitting: Family Medicine

## 2019-05-07 ENCOUNTER — Ambulatory Visit (INDEPENDENT_AMBULATORY_CARE_PROVIDER_SITE_OTHER): Payer: Medicare Other | Admitting: Family Medicine

## 2019-05-07 VITALS — BP 135/51 | HR 77 | Ht 70.0 in | Wt 250.0 lb

## 2019-05-07 DIAGNOSIS — E118 Type 2 diabetes mellitus with unspecified complications: Secondary | ICD-10-CM

## 2019-05-07 DIAGNOSIS — Z6835 Body mass index (BMI) 35.0-35.9, adult: Secondary | ICD-10-CM

## 2019-05-07 DIAGNOSIS — IMO0002 Reserved for concepts with insufficient information to code with codable children: Secondary | ICD-10-CM

## 2019-05-07 DIAGNOSIS — E1165 Type 2 diabetes mellitus with hyperglycemia: Secondary | ICD-10-CM | POA: Diagnosis not present

## 2019-05-07 DIAGNOSIS — M1A09X0 Idiopathic chronic gout, multiple sites, without tophus (tophi): Secondary | ICD-10-CM | POA: Diagnosis not present

## 2019-05-07 NOTE — Assessment & Plan Note (Signed)
Strongly encouraged him to cut back on carbs and sweets and work on weight loss if he is able to lose about 30 lbs it would make a big difference in his glucose levels.

## 2019-05-07 NOTE — Progress Notes (Addendum)
Established Patient Office Visit  Subjective:  Patient ID: Tristan Mayer, male    DOB: 1941-01-18  Age: 79 y.o. MRN: [id number]  CC:  Chief Complaint  Patient presents with  . Diabetes    HPI Tristan Mayer presents for Tristan Mayer reports that since he started doing the injection [name]) his BS sugars have not been below 200 and he has been experiencing more Gout attacks.still running in mid 200s. Says his diet hasn't change.   Has had 2 gout flares since last here.    He finished the farxiga,and actos and felt like when he was only taking the oral medication his BS were better controlled. He is now on metformin and glipizide and Tresiba.   He is taking 10 U of the [place]  Hasn't scheduled his ey exam yet.    Past Medical History:  Diagnosis Date  . Atrial fibrillation ([place])   . CVA (cerebral vascular accident) ([place])   . Diabetes mellitus ([place])   . GERD (gastroesophageal reflux disease)   . Hyperlipidemia   . PUD (peptic ulcer disease)     Past Surgical History:  Procedure Laterality Date  . CATARACT EXTRACTION W/ INTRAOCULAR LENS IMPLANT Right     Family History  Problem Relation Age of Onset  . Prostate cancer Father 19  . Stroke Father   . Diabetes Brother   . Diabetes Brother   . Atrial fibrillation Mother     Social History   Socioeconomic History  . Marital status: Married    Spouse name: [name]  . Number of children: 3  . Years of education: Not on file  . Highest education level: 12th grade  Occupational History  . Occupation: part time driver     Comment: Therapist, occupational and [employer]   Tobacco Use  . Smoking status: Current Some Day Smoker    Types: Cigars  . Smokeless tobacco: Never Used  Substance and Sexual Activity  . Alcohol use: Yes    Comment: Occasional  . Drug use: Not on file  . Sexual activity: Not on file  Other Topics Concern  . Not on file  Social History Narrative  . Not on file   Social Determinants of Health   Financial  Resource Strain:   . Difficulty of Paying Living Expenses: Not on file  Food Insecurity:   . Worried About Charity fundraiser in the Last Year: Not on file  . Ran Out of Food in the Last Year: Not on file  Transportation Needs:   . Lack of Transportation (Medical): Not on file  . Lack of Transportation (Non-Medical): Not on file  Physical Activity:   . Days of Exercise per Week: Not on file  . Minutes of Exercise per Session: Not on file  Stress:   . Feeling of Stress : Not on file  Social Connections:   . Frequency of Communication with Friends and Family: Not on file  . Frequency of Social Gatherings with Friends and Family: Not on file  . Attends Religious Services: Not on file  . Active Member of Clubs or Organizations: Not on file  . Attends Archivist Meetings: Not on file  . Marital Status: Not on file  Intimate Partner Violence:   . Fear of Current or Ex-Partner: Not on file  . Emotionally Abused: Not on file  . Physically Abused: Not on file  . Sexually Abused: Not on file    Outpatient Medications Prior to Visit  Medication Sig Dispense  Refill  . ACCU-CHEK GUIDE test strip     . allopurinol (ZYLOPRIM) 300 MG tablet Take 1 tablet by mouth daily.    . AMBULATORY NON FORMULARY MEDICATION Take 1 Scoop by mouth daily. Medication Name: Vital Reds 3.7grms daily    . Blood Glucose Calibration (ACCU-CHEK GUIDE CONTROL) LIQD     . Co-Enzyme Q-10 30 MG CAPS Take 400 mg by mouth daily.    Marland Kitchen diltiazem (TIAZAC) 120 MG 24 hr capsule Take 1 capsule by mouth once daily 90 capsule 1  . ECHINACEA PO Take 760 mg by mouth as needed.    . famotidine (PEPCID) 20 MG tablet Take 20 mg by mouth daily.    . finasteride (PROSCAR) 5 MG tablet TAKE 1 TABLET BY MOUTH AT BEDTIME 90 tablet 1  . gabapentin (NEURONTIN) 300 MG capsule Take 2 capsules (600 mg total) by mouth 2 (two) times daily. 360 capsule 1  . glipiZIDE (GLUCOTROL) 10 MG tablet TAKE 1 TABLET BY MOUTH TWICE DAILY BEFORE  MEAL(S) 180 tablet 1  . Insulin Pen Needle (PEN NEEDLES) 32G X 5 MM MISC 1 Units by Does not apply route daily. 100 each 1  . Melatonin 10 MG TABS Take 1 tablet by mouth daily.    . pioglitazone (ACTOS) 30 MG tablet Take 30 mg by mouth daily.    . Potassium 99 MG TABS Take 5 tablets by mouth daily.    . rivaroxaban (XARELTO) 20 MG TABS tablet TAKE 1 TABLET BY MOUTH ONCE DAILY WITH SUPPER 90 tablet 1  . rosuvastatin (CRESTOR) 40 MG tablet Take 1 tablet (40 mg total) by mouth daily. 90 tablet 3  . sertraline (ZOLOFT) 100 MG tablet Take 1 tablet (100 mg total) by mouth daily. 90 tablet 3  . tamsulosin (FLOMAX) 0.4 MG CAPS capsule Take 1 capsule by mouth twice daily 180 capsule 3  . Ultra Thin Lancets 31G MISC     . insulin degludec (TRESIBA FLEXTOUCH) 100 UNIT/ML SOPN FlexTouch Pen Inject 0.4-0.6 mLs (40-60 Units total) into the skin daily. 9 mL 1  . metFORMIN (GLUCOPHAGE) 1000 MG tablet TAKE 1 TABLET BY MOUTH TWICE DAILY WITH MEALS 180 tablet 0   No facility-administered medications prior to visit.    Allergies  Allergen Reactions  . Statins Other (See Comments)    Other reaction(s): Other All statin drugs Leg cramps when he was on atorvastatin and fenofibrate simultaneously. Discussed with his PCP Dr. Remus Blake on the phone [phone]) All statin drugs Leg cramps when he was on atorvastatin and fenofibrate simultaneously. Discussed with his PCP Dr. Remus Blake on the phone [phone]) All statin drugs   . Eliquis [Apixaban] Other (See Comments)    Feels weak     ROS Review of Systems    Objective:    Physical Exam  Constitutional: He is oriented to person, place, and time. He appears well-developed and well-nourished.  HENT:  Head: Normocephalic and atraumatic.  Cardiovascular: Normal rate, regular rhythm and normal heart sounds.  Pulmonary/Chest: Effort normal and breath sounds normal.  Neurological: He is alert and oriented to person, place, and time.  Skin: Skin is warm and  dry.  Psychiatric: He has a normal mood and affect. His behavior is normal.    BP (!) 135/51   Pulse 77   Ht 5\' 10"  (1.778 m)   Wt 250 lb (113.4 kg)   SpO2 96%   BMI 35.87 kg/m  Wt Readings from Last 3 Encounters:  05/07/19 250 lb (113.4 kg)  03/26/19  250 lb (113.4 kg)  02/25/19 251 lb (113.9 kg)     Health Maintenance Due  Topic Date Due  . OPHTHALMOLOGY EXAM  11/01/2018  . TETANUS/TDAP  05/03/2019    There are no preventive care reminders to display for this patient.  No results found for: TSH Lab Results  Component Value Date   WBC 6.6 09/20/2017   HGB 14.1 09/20/2017   HCT 40.9 09/20/2017   MCV 85.9 09/20/2017   PLT 179 09/20/2017   Lab Results  Component Value Date   NA 138 08/23/2018   K 4.5 08/23/2018   CO2 26 08/23/2018   GLUCOSE 183 (H) 08/23/2018   BUN 22 08/23/2018   CREATININE 1.12 08/23/2018   BILITOT 0.9 02/28/2018   AST 19 02/28/2018   ALT 20 02/28/2018   PROT 6.2 02/28/2018   CALCIUM 9.8 08/23/2018   Lab Results  Component Value Date   CHOL 125 02/28/2018   Lab Results  Component Value Date   HDL 44 02/28/2018   Lab Results  Component Value Date   LDLCALC 60 02/28/2018   Lab Results  Component Value Date   TRIG 131 02/28/2018   Lab Results  Component Value Date   CHOLHDL 2.8 02/28/2018   Lab Results  Component Value Date   HGBA1C 8.6 (A) 02/25/2019      Assessment & Plan:   Problem List Items Addressed This Visit      Endocrine   Uncontrolled diabetes mellitus with complication, without long-term current use of insulin ([place]) - Primary    Increase your Tresiba to 65 units for 3-5 days and call us and let us know what your sugar are doing.  Then I can adjust them.  F/U in 6 weeks.  Work on diet.        Relevant Medications   insulin degludec (TRESIBA FLEXTOUCH) 100 UNIT/ML SOPN FlexTouch Pen   Other Relevant Orders   COMPLETE METABOLIC PANEL WITH GFR   Lipid panel     Other   Severe obesity (BMI 35.0-35.9 with  comorbidity) ([place])    Strongly encouraged him to cut back on carbs and sweets and work on weight loss if he is able to lose about 30 lbs it would make a big difference in his glucose levels.        Relevant Medications   insulin degludec (TRESIBA FLEXTOUCH) 100 UNIT/ML SOPN FlexTouch Pen   Gout, unspecified    2 flares in short period of time. Will re heck uric  Acid.  Not on allopurinol.      Relevant Orders   Uric acid      Meds ordered this encounter  Medications  . insulin degludec (TRESIBA FLEXTOUCH) 100 UNIT/ML SOPN FlexTouch Pen    Sig: Inject 0.65-0.8 mLs (65-80 Units total) into the skin daily.    Dispense:  12 mL    Refill:  1   Discussed COVID vaccine and info given    Follow-up: Return in about 6 weeks (around 06/18/2019) for Diabetes follow-up.    Beatrice Lecher, MD

## 2019-05-07 NOTE — Assessment & Plan Note (Signed)
2 flares in short period of time. Will re heck uric  Acid.  Not on allopurinol.

## 2019-05-07 NOTE — Progress Notes (Signed)
Tristan Mayer reports that since he started doing the injection [name]) his BS sugars have not been below 200 and he has been experiencing more Gout attacks.  He finished the farxiga,and actos and felt like when he was only taking the oral medication his BS were better controlled.  He is taking 52 U of the [place]

## 2019-05-07 NOTE — Telephone Encounter (Signed)
Please call pt and let him know needs to go for fasting labs.

## 2019-05-07 NOTE — Patient Instructions (Signed)
Increase your [name] to 65 units for 3-5 days and call us and let us know what your sugar are doing.  Then I can adjust them.     Thank you for reaching out to our office.  If you are in Phase 1b, age 77 and over, you can contact the [facility] Department at [phone] to schedule an appointment for your vaccine.  Phone lines will be open 8 AM to 6:30 PM Monday through Friday, and 8 AM to 1 PM on Saturdays and Sundays.  They will start the vaccine clinic o 05/08/2019 per the [facility] Department web site. If you do not liver in [facility] I would recommend you go to your county health department web site to find out how to register for the vaccine and where to go.

## 2019-05-07 NOTE — Assessment & Plan Note (Signed)
Increase your [name] to 65 units for 3-5 days and call us and let us know what your sugar are doing.  Then I can adjust them.  F/U in 6 weeks.  Work on diet.

## 2019-05-08 MED ORDER — TRESIBA FLEXTOUCH 100 UNIT/ML ~~LOC~~ SOPN: 65.0000 [IU] | PEN_INJECTOR | Freq: Every day | SUBCUTANEOUS | 1 refills | Status: DC

## 2019-05-08 NOTE — Telephone Encounter (Signed)
LVM advising pt to go have fasting labs done and gave hours of operation. Told to call back if any questions.Maryruth Eve, [place] Crocker, CMA\

## 2019-05-08 NOTE — Addendum Note (Signed)
Addended by: Beatrice Lecher D on: 05/08/2019 02:17 PM   Modules accepted: Orders

## 2019-05-10 ENCOUNTER — Encounter: Payer: Self-pay | Admitting: Family Medicine

## 2019-05-15 DIAGNOSIS — E118 Type 2 diabetes mellitus with unspecified complications: Secondary | ICD-10-CM | POA: Diagnosis not present

## 2019-05-15 DIAGNOSIS — E1165 Type 2 diabetes mellitus with hyperglycemia: Secondary | ICD-10-CM | POA: Diagnosis not present

## 2019-05-15 DIAGNOSIS — M1A09X0 Idiopathic chronic gout, multiple sites, without tophus (tophi): Secondary | ICD-10-CM | POA: Diagnosis not present

## 2019-05-16 LAB — COMPLETE METABOLIC PANEL WITH GFR
AG Ratio: 1.7 (calc) (ref 1.0–2.5)
ALT: 18 U/L (ref 9–46)
AST: 21 U/L (ref 10–35)
Albumin: 4.3 g/dL (ref 3.6–5.1)
Alkaline phosphatase (APISO): 63 U/L (ref 35–144)
BUN/Creatinine Ratio: 16 (calc) (ref 6–22)
BUN: 23 mg/dL (ref 7–25)
CO2: 26 mmol/L (ref 20–32)
Calcium: 9.5 mg/dL (ref 8.6–10.3)
Chloride: 102 mmol/L (ref 98–110)
Creat: 1.46 mg/dL — ABNORMAL HIGH (ref 0.70–1.18)
GFR, Est African American: 53 mL/min/{1.73_m2} — ABNORMAL LOW (ref >=60)
GFR, Est Non African American: 46 mL/min/{1.73_m2} — ABNORMAL LOW (ref >=60)
Globulin: 2.5 g/dL (calc) (ref 1.9–3.7)
Glucose, Bld: 272 mg/dL — ABNORMAL HIGH (ref 65–99)
Potassium: 4.2 mmol/L (ref 3.5–5.3)
Sodium: 137 mmol/L (ref 135–146)
Total Bilirubin: 1.1 mg/dL (ref 0.2–1.2)
Total Protein: 6.8 g/dL (ref 6.1–8.1)

## 2019-05-16 LAB — LIPID PANEL
Cholesterol: 121 mg/dL (ref <200)
HDL: 31 mg/dL — ABNORMAL LOW (ref >=40)
LDL Cholesterol (Calc): 65 mg/dL (calc)
Non-HDL Cholesterol (Calc): 90 mg/dL (calc) (ref <130)
Total CHOL/HDL Ratio: 3.9 (calc) (ref <5.0)
Triglycerides: 170 mg/dL — ABNORMAL HIGH (ref <150)

## 2019-05-16 LAB — URIC ACID: Uric Acid, Serum: 4 mg/dL (ref 4.0–8.0)

## 2019-05-17 ENCOUNTER — Other Ambulatory Visit: Payer: Self-pay | Admitting: *Deleted

## 2019-05-17 DIAGNOSIS — R7989 Other specified abnormal findings of blood chemistry: Secondary | ICD-10-CM

## 2019-05-22 ENCOUNTER — Ambulatory Visit (INDEPENDENT_AMBULATORY_CARE_PROVIDER_SITE_OTHER): Payer: Medicare Other | Admitting: Physician Assistant

## 2019-05-22 ENCOUNTER — Other Ambulatory Visit: Payer: Self-pay

## 2019-05-22 VITALS — BP 127/84 | HR 66 | Ht 71.0 in | Wt 247.0 lb

## 2019-05-22 DIAGNOSIS — E118 Type 2 diabetes mellitus with unspecified complications: Secondary | ICD-10-CM | POA: Diagnosis not present

## 2019-05-22 DIAGNOSIS — E1165 Type 2 diabetes mellitus with hyperglycemia: Secondary | ICD-10-CM | POA: Diagnosis not present

## 2019-05-22 DIAGNOSIS — IMO0002 Reserved for concepts with insufficient information to code with codable children: Secondary | ICD-10-CM

## 2019-05-22 DIAGNOSIS — I10 Essential (primary) hypertension: Secondary | ICD-10-CM

## 2019-05-22 MED ORDER — GABAPENTIN 300 MG PO CAPS: ORAL_CAPSULE | ORAL | 1 refills | Status: DC

## 2019-05-22 NOTE — Patient Instructions (Signed)
Split tresbia dose 40 units at night and 30 units in morning.  Will call with any additional medication.

## 2019-05-22 NOTE — Progress Notes (Signed)
Subjective:    Patient ID: Tristan Mayer, male    DOB: 01/10/1941, 79 y.o.   MRN: [id number]  HPI Pt is a 79 yo male with uncontrolled T2DM who presents to the clinic concerned about his sugars not going down.   His last a1c was:  .Marland Kitchen Lab Results  Component Value Date   HGBA1C 8.6 (A) 02/25/2019   He brings in fasting glucose and ranging in low 200's every morning. He is taking metformin, glipizide and tresbia 68 units. He saw Dr. Madilyn Mayer about 2 weeks ago where she took him off farxiga and actos to tried to lower his monthly cost. He reports not eating or drinking sugary things. He is eating a lot of protein but endorses eating a fried chicken patty for breakfast this morning. He is not keeping log of food.   .. Active Ambulatory Problems    Diagnosis Date Noted  . Allergic rhinitis 01/07/2007  . Benign essential hypertension 01/07/2007  . 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 Gout, unspecified 01/07/2007  . INO (internuclear ophthalmoplegia), left 07/03/2017  . Other and unspecified hyperlipidemia 05/20/2008  . Paroxysmal A-fib ([place]) 12/12/2016  . Sleep apnea 07/19/2007  . Thrombsis of left atrial appendage without antecedent myocardial infarction 12/09/2016  . Uncontrolled diabetes mellitus with complication, without long-term current use of insulin ([place]) 08/14/2008  . Hearing loss 07/03/2017  . ED (erectile dysfunction) 07/03/2017  . BPH (benign prostatic hyperplasia) 07/10/2017  . Primary osteoarthritis of right knee 01/03/2018  . Aortic atherosclerosis ([place]) 03/12/2018  . Morbid obesity ([place]) 05/23/2018  . Severe obesity (BMI 35.0-35.9 with comorbidity) ([place]) 08/22/2018  . BMI 35.0-35.9,adult 03/26/2019   Resolved Ambulatory Problems    Diagnosis Date Noted  . Irritability 07/03/2017  . Rib fracture 03/12/2018   Past Medical History:  Diagnosis Date  . Atrial fibrillation ([place])   . CVA (cerebral  vascular accident) ([place])   . Diabetes mellitus ([place])   . GERD (gastroesophageal reflux disease)   . Hyperlipidemia   . PUD (peptic ulcer disease)       Review of Systems See HPI.     Objective:   Physical Exam Vitals reviewed.  Constitutional:      Appearance: Normal appearance.  Cardiovascular:     Rate and Rhythm: Normal rate and regular rhythm.     Pulses: Normal pulses.  Neurological:     General: No focal deficit present.     Mental Status: He is alert and oriented to person, place, and time.  Psychiatric:        Mood and Affect: Mood normal.           Assessment & Plan:  .Marland KitchenMaxie was seen today for hyperglycemia.  Diagnoses and all orders for this visit:  Uncontrolled diabetes mellitus with complication, without long-term current use of insulin ([facility])  Benign essential hypertension  Morbid obesity ([place])  Other orders -     gabapentin (NEURONTIN) 300 MG capsule; 2 in the AM, 3 in the PM   Unclear why his sugars are not going down if he is using insulin. Discussed how to give injection and asked if medication was coming out. He stated he sees it leaving the pen. He states his diet has been great but then endorses eating a fried chicken patty this morning for breakfast. I would like for him to keep detailed list of all his foods from now until he sees [place]. Continue to keep log of fasting and evening  blood sugars.   He is already at a larger dose of insulin. I split tresiba to take 40units at night and 30 units in am. All other medications stay the same.   Discussed with Dr. Madilyn Mayer and she will see him in a week or so and would like for him to bring his tresiba pen in to see if he is getting this insulin.   BP looks great.   Spent 30 minutes with patient discussing diet and medication.

## 2019-05-24 ENCOUNTER — Encounter: Payer: Self-pay | Admitting: Physician Assistant

## 2019-05-25 ENCOUNTER — Other Ambulatory Visit: Payer: Self-pay | Admitting: Family Medicine

## 2019-05-27 ENCOUNTER — Other Ambulatory Visit: Payer: Self-pay | Admitting: Family Medicine

## 2019-05-27 NOTE — Telephone Encounter (Signed)
Please call patient:   Discussed with metheney medication plan. For now plan as discussed in office stays the same. When you come in for next appt please bring tresiba pen she would like you to demonstrate how you use it. Keep log of all foods so she can look over it as well.

## 2019-05-27 NOTE — Telephone Encounter (Signed)
Patient made aware of instructions.

## 2019-06-07 ENCOUNTER — Ambulatory Visit (INDEPENDENT_AMBULATORY_CARE_PROVIDER_SITE_OTHER): Payer: Medicare Other | Admitting: Family Medicine

## 2019-06-07 ENCOUNTER — Encounter: Payer: Self-pay | Admitting: Family Medicine

## 2019-06-07 ENCOUNTER — Other Ambulatory Visit: Payer: Self-pay

## 2019-06-07 VITALS — BP 138/56 | HR 59 | Ht 71.0 in | Wt 247.0 lb

## 2019-06-07 DIAGNOSIS — E1165 Type 2 diabetes mellitus with hyperglycemia: Secondary | ICD-10-CM | POA: Diagnosis not present

## 2019-06-07 DIAGNOSIS — IMO0002 Reserved for concepts with insufficient information to code with codable children: Secondary | ICD-10-CM

## 2019-06-07 DIAGNOSIS — E118 Type 2 diabetes mellitus with unspecified complications: Secondary | ICD-10-CM | POA: Diagnosis not present

## 2019-06-07 LAB — POCT GLYCOSYLATED HEMOGLOBIN (HGB A1C): Hemoglobin A1C: 8.7 % — AB (ref 4.0–5.6)

## 2019-06-07 MED ORDER — INSULIN PEN NEEDLE 29G X 12.7MM MISC: 1 refills | Status: DC

## 2019-06-07 MED ORDER — TRESIBA FLEXTOUCH 100 UNIT/ML ~~LOC~~ SOPN: 65.0000 [IU] | PEN_INJECTOR | Freq: Every day | SUBCUTANEOUS | 1 refills | Status: DC

## 2019-06-07 NOTE — Progress Notes (Signed)
Established Patient Office Visit  Subjective:  Patient ID: Tristan Mayer, male    DOB: 11-Sep-1940  Age: 79 y.o. MRN: [id number]  CC:  Chief Complaint  Patient presents with  . Diabetes    HPI Regory Cluney presents for   Diabetes - no hypoglycemic events. No wounds or sores that are not healing well. No increased thirst or urination. Checking glucose at home. Taking medications as prescribed without any side effects. He was seen in the interim for high sugar. He is using [place] 32 in AM and   42 units BID.  He fels like his needles are working says says the medicine doesn't seem to go into his skin. He withdraws the needle and it comes out.    Brought in home log and nutrition log for the last couple of weeks.  Says when he cut out all carbs his sugars were much better but doesn't feel ike he an sustain that.    Past Medical History:  Diagnosis Date  . Atrial fibrillation ([place])   . CVA (cerebral vascular accident) ([place])   . Diabetes mellitus ([place])   . GERD (gastroesophageal reflux disease)   . Hyperlipidemia   . PUD (peptic ulcer disease)     Past Surgical History:  Procedure Laterality Date  . CATARACT EXTRACTION W/ INTRAOCULAR LENS IMPLANT Right     Family History  Problem Relation Age of Onset  . Prostate cancer Father 22  . Stroke Father   . Diabetes Brother   . Diabetes Brother   . Atrial fibrillation Mother     Social History   Socioeconomic History  . Marital status: Married    Spouse name: [name]  . Number of children: 3  . Years of education: Not on file  . Highest education level: 12th grade  Occupational History  . Occupation: part time driver     Comment: Therapist, occupational and [employer]   Tobacco Use  . Smoking status: Current Some Day Smoker    Types: Cigars  . Smokeless tobacco: Never Used  Substance and Sexual Activity  . Alcohol use: Yes    Comment: Occasional  . Drug use: Not on file  . Sexual activity: Not on file  Other Topics Concern  . Not on  file  Social History Narrative  . Not on file   Social Determinants of Health   Financial Resource Strain:   . Difficulty of Paying Living Expenses: Not on file  Food Insecurity:   . Worried About Charity fundraiser in the Last Year: Not on file  . Ran Out of Food in the Last Year: Not on file  Transportation Needs:   . Lack of Transportation (Medical): Not on file  . Lack of Transportation (Non-Medical): Not on file  Physical Activity:   . Days of Exercise per Week: Not on file  . Minutes of Exercise per Session: Not on file  Stress:   . Feeling of Stress : Not on file  Social Connections:   . Frequency of Communication with Friends and Family: Not on file  . Frequency of Social Gatherings with Friends and Family: Not on file  . Attends Religious Services: Not on file  . Active Member of Clubs or Organizations: Not on file  . Attends Archivist Meetings: Not on file  . Marital Status: Not on file  Intimate Partner Violence:   . Fear of Current or Ex-Partner: Not on file  . Emotionally Abused: Not on file  . Physically  Abused: Not on file  . Sexually Abused: Not on file    Outpatient Medications Prior to Visit  Medication Sig Dispense Refill  . ACCU-CHEK GUIDE test strip     . AMBULATORY NON FORMULARY MEDICATION Take 1 Scoop by mouth daily. Medication Name: Vital Reds 3.7grms daily    . Blood Glucose Calibration (ACCU-CHEK GUIDE CONTROL) LIQD     . Co-Enzyme Q-10 30 MG CAPS Take 400 mg by mouth daily.    Marland Kitchen diltiazem (TIAZAC) 120 MG 24 hr capsule Take 1 capsule by mouth once daily 90 capsule 1  . famotidine (PEPCID) 20 MG tablet Take 20 mg by mouth daily.    . finasteride (PROSCAR) 5 MG tablet TAKE 1 TABLET BY MOUTH AT BEDTIME 90 tablet 1  . gabapentin (NEURONTIN) 300 MG capsule 2 in the AM, 3 in the PM 450 capsule 1  . glipiZIDE (GLUCOTROL) 10 MG tablet TAKE 1 TABLET BY MOUTH TWICE DAILY BEFORE MEAL(S) 180 tablet 1  . Melatonin 10 MG TABS Take 1 tablet by mouth  daily.    . metFORMIN (GLUCOPHAGE) 1000 MG tablet TAKE 1 TABLET BY MOUTH TWICE DAILY WITH MEALS 180 tablet 1  . Potassium 99 MG TABS Take 5 tablets by mouth daily.    . rivaroxaban (XARELTO) 20 MG TABS tablet TAKE 1 TABLET BY MOUTH ONCE DAILY WITH SUPPER 90 tablet 1  . rosuvastatin (CRESTOR) 40 MG tablet Take 1 tablet (40 mg total) by mouth daily. 90 tablet 3  . sertraline (ZOLOFT) 100 MG tablet Take 1 tablet by mouth once daily 90 tablet 0  . tamsulosin (FLOMAX) 0.4 MG CAPS capsule Take 1 capsule by mouth twice daily 180 capsule 1  . Ultra Thin Lancets 31G MISC     . B-D UF III MINI PEN NEEDLES 31G X 5 MM MISC     . insulin degludec (TRESIBA FLEXTOUCH) 100 UNIT/ML SOPN FlexTouch Pen Inject 0.65-0.8 mLs (65-80 Units total) into the skin daily. (Patient taking differently: Inject 65-80 Units into the skin daily. 68 units) 12 mL 1  . Insulin Pen Needle (PEN NEEDLES) 32G X 5 MM MISC 1 Units by Does not apply route daily. 100 each 1   No facility-administered medications prior to visit.    Allergies  Allergen Reactions  . Statins Other (See Comments)    Other reaction(s): Other All statin drugs Leg cramps when he was on atorvastatin and fenofibrate simultaneously. Discussed with his PCP Dr. Remus Blake on the phone [phone]) All statin drugs Leg cramps when he was on atorvastatin and fenofibrate simultaneously. Discussed with his PCP Dr. Remus Blake on the phone [phone]) All statin drugs   . Eliquis [Apixaban] Other (See Comments)    Feels weak     ROS Review of Systems    Objective:    Physical Exam  Constitutional: He is oriented to person, place, and time. He appears well-developed and well-nourished.  HENT:  Head: Normocephalic and atraumatic.  Cardiovascular: Normal rate, regular rhythm and normal heart sounds.  Pulmonary/Chest: Effort normal and breath sounds normal.  Neurological: He is alert and oriented to person, place, and time.  Skin: Skin is warm and dry.   Psychiatric: He has a normal mood and affect. His behavior is normal.    BP (!) 138/56   Pulse (!) 59   Ht 5\' 11"  (1.803 m)   Wt 247 lb (112 kg)   SpO2 97%   BMI 34.45 kg/m  Wt Readings from Last 3 Encounters:  06/07/19 247 lb (112  kg)  05/22/19 247 lb (112 kg)  05/07/19 250 lb (113.4 kg)     There are no preventive care reminders to display for this patient.  There are no preventive care reminders to display for this patient.  No results found for: TSH Lab Results  Component Value Date   WBC 6.6 09/20/2017   HGB 14.1 09/20/2017   HCT 40.9 09/20/2017   MCV 85.9 09/20/2017   PLT 179 09/20/2017   Lab Results  Component Value Date   NA 137 05/15/2019   K 4.2 05/15/2019   CO2 26 05/15/2019   GLUCOSE 272 (H) 05/15/2019   BUN 23 05/15/2019   CREATININE 1.46 (H) 05/15/2019   BILITOT 1.1 05/15/2019   AST 21 05/15/2019   ALT 18 05/15/2019   PROT 6.8 05/15/2019   CALCIUM 9.5 05/15/2019   Lab Results  Component Value Date   CHOL 121 05/15/2019   Lab Results  Component Value Date   HDL 31 (L) 05/15/2019   Lab Results  Component Value Date   LDLCALC 65 05/15/2019   Lab Results  Component Value Date   TRIG 170 (H) 05/15/2019   Lab Results  Component Value Date   CHOLHDL 3.9 05/15/2019   Lab Results  Component Value Date   HGBA1C 8.7 (A) 06/07/2019      Assessment & Plan:   Problem List Items Addressed This Visit      Endocrine   Uncontrolled diabetes mellitus with complication, without long-term current use of insulin ([place]) - Primary    Had long discussion about continuing to work on low carb diet and it clearly shows a big number on his sugar level.  Refilled [place].  I also suspect his needles are too short, using Minis, and likely not getting full dose of his medication which is affecting his glucose levels.  Will switch to long needles  Asked him to call me back if having problems with the new needles.  Will monitor for about 2 more weeks.        Relevant Medications   insulin degludec (TRESIBA FLEXTOUCH) 100 UNIT/ML SOPN FlexTouch Pen   Other Relevant Orders   POCT glycosylated hemoglobin (Hb A1C) (Completed)      Meds ordered this encounter  Medications  . insulin degludec (TRESIBA FLEXTOUCH) 100 UNIT/ML SOPN FlexTouch Pen    Sig: Inject 0.65-0.8 mLs (65-80 Units total) into the skin daily.    Dispense:  12 mL    Refill:  1  . Insulin Pen Needle 29G X 12.7MM MISC    Sig: Use BID. Dx Diabetes. Pt need long or extra long needle for his pens.    Dispense:  100 each    Refill:  1    Follow-up: Return in about 3 months (around 09/04/2019) for Diabetes follow-up.   Time spent 20 minutes.   Beatrice Lecher, MD

## 2019-06-08 ENCOUNTER — Encounter: Payer: Self-pay | Admitting: Family Medicine

## 2019-06-08 NOTE — Assessment & Plan Note (Signed)
Had long discussion about continuing to work on low carb diet and it clearly shows a big number on his sugar level.  Refilled [place].  I also suspect his needles are too short, using Minis, and likely not getting full dose of his medication which is affecting his glucose levels.  Will switch to long needles  Asked him to call me back if having problems with the new needles.  Will monitor for about 2 more weeks.

## 2019-06-13 ENCOUNTER — Telehealth: Payer: Self-pay | Admitting: Family Medicine

## 2019-06-13 DIAGNOSIS — IMO0002 Reserved for concepts with insufficient information to code with codable children: Secondary | ICD-10-CM

## 2019-06-13 DIAGNOSIS — E1165 Type 2 diabetes mellitus with hyperglycemia: Secondary | ICD-10-CM

## 2019-06-13 MED ORDER — BLOOD GLUCOSE METER KIT: PACK | 0 refills | Status: DC

## 2019-06-13 NOTE — Telephone Encounter (Signed)
Tristan Mayer stopped in just before lunch asking if you could call in to the pharmacy for more test strips. He is completely out of them. He also stated that he needs a new test kit, because his is very old and he doesn't think that they make strips for it anymore.

## 2019-06-13 NOTE — Telephone Encounter (Signed)
Pt advised that this will be faxed today .Marland KitchenElouise Mayer, [name]

## 2019-06-14 ENCOUNTER — Other Ambulatory Visit: Payer: Self-pay

## 2019-06-14 DIAGNOSIS — IMO0002 Reserved for concepts with insufficient information to code with codable children: Secondary | ICD-10-CM

## 2019-06-14 DIAGNOSIS — E1165 Type 2 diabetes mellitus with hyperglycemia: Secondary | ICD-10-CM

## 2019-06-14 MED ORDER — ONETOUCH VERIO VI STRP: ORAL_STRIP | 12 refills | Status: DC

## 2019-06-14 MED ORDER — ONETOUCH VERIO REFLECT W/DEVICE KIT: 1.0000 | PACK | Freq: Two times a day (BID) | 99 refills | Status: DC

## 2019-06-18 ENCOUNTER — Ambulatory Visit: Payer: Medicare Other | Admitting: Family Medicine

## 2019-07-08 ENCOUNTER — Encounter: Payer: Self-pay | Admitting: Family Medicine

## 2019-07-08 NOTE — Telephone Encounter (Signed)
Forwarding to provider for recommendation. When responding please send to [place]. Thanks

## 2019-07-12 ENCOUNTER — Other Ambulatory Visit: Payer: Self-pay | Admitting: Family Medicine

## 2019-07-24 DIAGNOSIS — M222X1 Patellofemoral disorders, right knee: Secondary | ICD-10-CM | POA: Diagnosis not present

## 2019-07-24 DIAGNOSIS — M25562 Pain in left knee: Secondary | ICD-10-CM | POA: Diagnosis not present

## 2019-07-24 DIAGNOSIS — M222X2 Patellofemoral disorders, left knee: Secondary | ICD-10-CM | POA: Diagnosis not present

## 2019-07-24 DIAGNOSIS — M1712 Unilateral primary osteoarthritis, left knee: Secondary | ICD-10-CM | POA: Diagnosis not present

## 2019-07-24 DIAGNOSIS — M1711 Unilateral primary osteoarthritis, right knee: Secondary | ICD-10-CM | POA: Diagnosis not present

## 2019-07-24 DIAGNOSIS — M25561 Pain in right knee: Secondary | ICD-10-CM | POA: Diagnosis not present

## 2019-07-24 DIAGNOSIS — M25762 Osteophyte, left knee: Secondary | ICD-10-CM | POA: Diagnosis not present

## 2019-07-24 DIAGNOSIS — M25761 Osteophyte, right knee: Secondary | ICD-10-CM | POA: Diagnosis not present

## 2019-07-25 ENCOUNTER — Other Ambulatory Visit: Payer: Self-pay | Admitting: Family Medicine

## 2019-07-31 DIAGNOSIS — M25562 Pain in left knee: Secondary | ICD-10-CM | POA: Diagnosis not present

## 2019-07-31 DIAGNOSIS — M25762 Osteophyte, left knee: Secondary | ICD-10-CM | POA: Diagnosis not present

## 2019-07-31 DIAGNOSIS — M1712 Unilateral primary osteoarthritis, left knee: Secondary | ICD-10-CM | POA: Diagnosis not present

## 2019-08-01 DIAGNOSIS — M25761 Osteophyte, right knee: Secondary | ICD-10-CM | POA: Diagnosis not present

## 2019-08-01 DIAGNOSIS — M1711 Unilateral primary osteoarthritis, right knee: Secondary | ICD-10-CM | POA: Diagnosis not present

## 2019-08-01 DIAGNOSIS — M25561 Pain in right knee: Secondary | ICD-10-CM | POA: Diagnosis not present

## 2019-08-07 DIAGNOSIS — M25762 Osteophyte, left knee: Secondary | ICD-10-CM | POA: Diagnosis not present

## 2019-08-07 DIAGNOSIS — M25562 Pain in left knee: Secondary | ICD-10-CM | POA: Diagnosis not present

## 2019-08-07 DIAGNOSIS — M1712 Unilateral primary osteoarthritis, left knee: Secondary | ICD-10-CM | POA: Diagnosis not present

## 2019-08-08 ENCOUNTER — Other Ambulatory Visit: Payer: Self-pay | Admitting: Family Medicine

## 2019-08-08 DIAGNOSIS — M25761 Osteophyte, right knee: Secondary | ICD-10-CM | POA: Diagnosis not present

## 2019-08-08 DIAGNOSIS — M1711 Unilateral primary osteoarthritis, right knee: Secondary | ICD-10-CM | POA: Diagnosis not present

## 2019-08-08 DIAGNOSIS — M222X1 Patellofemoral disorders, right knee: Secondary | ICD-10-CM | POA: Diagnosis not present

## 2019-08-08 DIAGNOSIS — M25561 Pain in right knee: Secondary | ICD-10-CM | POA: Diagnosis not present

## 2019-08-15 DIAGNOSIS — M1712 Unilateral primary osteoarthritis, left knee: Secondary | ICD-10-CM | POA: Diagnosis not present

## 2019-08-15 DIAGNOSIS — M222X2 Patellofemoral disorders, left knee: Secondary | ICD-10-CM | POA: Diagnosis not present

## 2019-08-15 DIAGNOSIS — M25762 Osteophyte, left knee: Secondary | ICD-10-CM | POA: Diagnosis not present

## 2019-08-15 DIAGNOSIS — R2689 Other abnormalities of gait and mobility: Secondary | ICD-10-CM | POA: Diagnosis not present

## 2019-08-15 DIAGNOSIS — M25562 Pain in left knee: Secondary | ICD-10-CM | POA: Diagnosis not present

## 2019-08-21 DIAGNOSIS — M25561 Pain in right knee: Secondary | ICD-10-CM | POA: Diagnosis not present

## 2019-08-21 DIAGNOSIS — M25761 Osteophyte, right knee: Secondary | ICD-10-CM | POA: Diagnosis not present

## 2019-08-21 DIAGNOSIS — M1711 Unilateral primary osteoarthritis, right knee: Secondary | ICD-10-CM | POA: Diagnosis not present

## 2019-08-22 DIAGNOSIS — M25762 Osteophyte, left knee: Secondary | ICD-10-CM | POA: Diagnosis not present

## 2019-08-22 DIAGNOSIS — M25562 Pain in left knee: Secondary | ICD-10-CM | POA: Diagnosis not present

## 2019-08-22 DIAGNOSIS — M1712 Unilateral primary osteoarthritis, left knee: Secondary | ICD-10-CM | POA: Diagnosis not present

## 2019-08-22 DIAGNOSIS — R2689 Other abnormalities of gait and mobility: Secondary | ICD-10-CM | POA: Diagnosis not present

## 2019-08-28 DIAGNOSIS — M25761 Osteophyte, right knee: Secondary | ICD-10-CM | POA: Diagnosis not present

## 2019-08-28 DIAGNOSIS — M1711 Unilateral primary osteoarthritis, right knee: Secondary | ICD-10-CM | POA: Diagnosis not present

## 2019-08-28 DIAGNOSIS — M25561 Pain in right knee: Secondary | ICD-10-CM | POA: Diagnosis not present

## 2019-08-28 DIAGNOSIS — R2689 Other abnormalities of gait and mobility: Secondary | ICD-10-CM | POA: Diagnosis not present

## 2019-08-28 DIAGNOSIS — M222X1 Patellofemoral disorders, right knee: Secondary | ICD-10-CM | POA: Diagnosis not present

## 2019-08-29 DIAGNOSIS — M1712 Unilateral primary osteoarthritis, left knee: Secondary | ICD-10-CM | POA: Diagnosis not present

## 2019-09-01 ENCOUNTER — Other Ambulatory Visit: Payer: Self-pay | Admitting: Family Medicine

## 2019-09-04 ENCOUNTER — Ambulatory Visit: Payer: Medicare Other | Admitting: Family Medicine

## 2019-09-11 ENCOUNTER — Ambulatory Visit: Payer: Medicare Other | Admitting: Family Medicine

## 2019-09-11 DIAGNOSIS — M1711 Unilateral primary osteoarthritis, right knee: Secondary | ICD-10-CM | POA: Diagnosis not present

## 2019-09-12 DIAGNOSIS — M1712 Unilateral primary osteoarthritis, left knee: Secondary | ICD-10-CM | POA: Diagnosis not present

## 2019-09-14 ENCOUNTER — Other Ambulatory Visit: Payer: Self-pay | Admitting: Family Medicine

## 2019-09-16 ENCOUNTER — Ambulatory Visit (INDEPENDENT_AMBULATORY_CARE_PROVIDER_SITE_OTHER): Payer: Medicare Other | Admitting: Family Medicine

## 2019-09-16 ENCOUNTER — Other Ambulatory Visit: Payer: Self-pay | Admitting: *Deleted

## 2019-09-16 ENCOUNTER — Other Ambulatory Visit: Payer: Self-pay | Admitting: Family Medicine

## 2019-09-16 ENCOUNTER — Encounter: Payer: Self-pay | Admitting: Family Medicine

## 2019-09-16 VITALS — BP 138/46 | HR 61 | Ht 71.0 in | Wt 253.0 lb

## 2019-09-16 DIAGNOSIS — K5900 Constipation, unspecified: Secondary | ICD-10-CM | POA: Diagnosis not present

## 2019-09-16 DIAGNOSIS — E1165 Type 2 diabetes mellitus with hyperglycemia: Secondary | ICD-10-CM

## 2019-09-16 DIAGNOSIS — E118 Type 2 diabetes mellitus with unspecified complications: Secondary | ICD-10-CM

## 2019-09-16 DIAGNOSIS — IMO0002 Reserved for concepts with insufficient information to code with codable children: Secondary | ICD-10-CM

## 2019-09-16 DIAGNOSIS — I10 Essential (primary) hypertension: Secondary | ICD-10-CM

## 2019-09-16 DIAGNOSIS — Z6835 Body mass index (BMI) 35.0-35.9, adult: Secondary | ICD-10-CM

## 2019-09-16 DIAGNOSIS — G609 Hereditary and idiopathic neuropathy, unspecified: Secondary | ICD-10-CM

## 2019-09-16 DIAGNOSIS — M1711 Unilateral primary osteoarthritis, right knee: Secondary | ICD-10-CM | POA: Diagnosis not present

## 2019-09-16 LAB — POCT GLYCOSYLATED HEMOGLOBIN (HGB A1C): Hemoglobin A1C: 8.3 % — AB (ref 4.0–5.6)

## 2019-09-16 NOTE — Assessment & Plan Note (Signed)
A1c improved to 8.3 from 8.6.  Looking at his blood glucose logs have gotten significantly better in the last 2 weeks.  He actually realized he was not getting his insulin because he was not taking the smaller needle.  He says that if his glucose was greater than 175 he was getting 50 units and if it was less than 130 he was only giving 35 units.  If it was in between he was using 40 units.  Advised taking.  He wants to know what he should be taking regularly.  He is really trying hard to cut back on sweets.  We discussed just using 38 units daily of Tresiba and will be discharged from there.

## 2019-09-16 NOTE — Assessment & Plan Note (Signed)
That he had injections in his knees and since then says that his knee pain has been significantly better but now he is noticing he has neuropathy more.  He would like to see if we could increase his gabapentin.  Discussed adding a midday dose of 300 mg to his regimen to see if this is helpful.

## 2019-09-16 NOTE — Assessment & Plan Note (Signed)
Ok

## 2019-09-16 NOTE — Assessment & Plan Note (Signed)
Continue work on [employer] and staying active.

## 2019-09-16 NOTE — Telephone Encounter (Signed)
Patient seen today- not sure if he mentioned this?

## 2019-09-16 NOTE — Progress Notes (Signed)
 Established Patient Office Visit  Subjective:  Patient ID: Tristan Mayer, male    DOB: 06/17/1940  Age: 79 y.o. MRN: [id number]  CC:  Chief Complaint  Patient presents with  . Diabetes    HPI Tristan Mayer presents for   Diabetes - no hypoglycemic events. No wounds or sores that are not healing well. No increased thirst or urination. Checking glucose at home. Taking medications as prescribed without any side effects.  He also has some white spots on his fingernails that he would like me to look at today.  They are benign.    He also complains of left outer hip pain.  He says it is painful to sleep on that helps.  It bothers him with walking.  He has had pain over the left hip crease but only at night when he lays down flat on his back.  He says he not normally a back sleeper but because of his knee pain and left outer hip pain he has been trying to sleep little bit more on his back.  The hip crease pain does not bother him during the daytime.  He recently started collagen for his joint pain.  He does feel like it might be helping.  So him to let me know that since his stroke he still coughing sometimes after eating and drinking foods.  He had significant swallowing difficulties right after stroke and says that part is better but still noticing occasional cough.  Also been struggling with constipation and wonders if it could be medication side effect.  He wants to know what to do to get his bowels moving more efficiently.   Past Medical History:  Diagnosis Date  . Atrial fibrillation (HCC)   . CVA (cerebral vascular accident) (HCC)   . Diabetes mellitus (HCC)   . GERD (gastroesophageal reflux disease)   . Hyperlipidemia   . PUD (peptic ulcer disease)     Past Surgical History:  Procedure Laterality Date  . CATARACT EXTRACTION W/ INTRAOCULAR LENS IMPLANT Right     Family History  Problem Relation Age of Onset  . Prostate cancer Father 65  . Stroke Father   . Diabetes  Brother   . Diabetes Brother   . Atrial fibrillation Mother     Social History   Socioeconomic History  . Marital status: Married    Spouse name: [name]  . Number of children: 3  . Years of education: Not on file  . Highest education level: 12th grade  Occupational History  . Occupation: part time driver     Comment: [employer]   Tobacco Use  . Smoking status: Current Some Day Smoker    Types: Cigars  . Smokeless tobacco: Never Used  Substance and Sexual Activity  . Alcohol use: Yes    Comment: Occasional  . Drug use: Not on file  . Sexual activity: Not on file  Other Topics Concern  . Not on file  Social History Narrative  . Not on file   Social Determinants of Health   Financial Resource Strain:   . Difficulty of Paying Living Expenses:   Food Insecurity:   . Worried About Running Out of Food in the Last Year:   . Ran Out of Food in the Last Year:   Transportation Needs:   . Lack of Transportation (Medical):   . Lack of Transportation (Non-Medical):   Physical Activity:   . Days of Exercise per Week:   . Minutes of Exercise per   Session:   Stress:   . Feeling of Stress :   Social Connections:   . Frequency of Communication with Friends and Family:   . Frequency of Social Gatherings with Friends and Family:   . Attends Religious Services:   . Active Member of Clubs or Organizations:   . Attends Club or Organization Meetings:   . Marital Status:   Intimate Partner Violence:   . Fear of Current or Ex-Partner:   . Emotionally Abused:   . Physically Abused:   . Sexually Abused:     Outpatient Medications Prior to Visit  Medication Sig Dispense Refill  . Blood Glucose Calibration (ACCU-CHEK GUIDE CONTROL) LIQD     . blood glucose meter kit and supplies Dispense based on patient and insurance preference. Use up to four times daily as directed. Dx: E11.8, E11.65 1 each 0  . Blood Glucose Monitoring Suppl (ONETOUCH VERIO REFLECT) w/Device KIT 1 each by  Does not apply route 2 (two) times daily. Dx: E11.8, E11.65 - Check fasting blood sugar every morning and once 2 hours after largest meal of the day. 1 kit prn  . Co-Enzyme Q-10 30 MG CAPS Take 400 mg by mouth daily.    . diltiazem (TIAZAC) 120 MG 24 hr capsule Take 1 capsule by mouth once daily 90 capsule 1  . famotidine (PEPCID) 20 MG tablet Take 20 mg by mouth daily.    . finasteride (PROSCAR) 5 MG tablet TAKE 1 TABLET BY MOUTH AT BEDTIME 90 tablet 1  . gabapentin (NEURONTIN) 300 MG capsule 2 in the AM, 3 in the PM 450 capsule 1  . glipiZIDE (GLUCOTROL) 10 MG tablet TAKE 1 TABLET BY MOUTH TWICE DAILY BEFORE MEAL(S) 180 tablet 1  . glucose blood (ONETOUCH VERIO) test strip Dx: E11.8, E11.65 - Check fasting blood sugar every morning and once 2 hours after largest meal of the day. 100 each 12  . Insulin Pen Needle 29G X 12.7MM MISC Use BID. Dx Diabetes. Pt need long or extra long needle for his pens. 100 each 1  . Melatonin 10 MG TABS Take 1 tablet by mouth daily.    . metFORMIN (GLUCOPHAGE) 1000 MG tablet TAKE 1 TABLET BY MOUTH TWICE DAILY WITH MEALS 180 tablet 1  . Potassium 99 MG TABS Take 5 tablets by mouth daily.    . rivaroxaban (XARELTO) 20 MG TABS tablet TAKE 1 TABLET BY MOUTH ONCE DAILY WITH SUPPER 90 tablet 1  . rosuvastatin (CRESTOR) 40 MG tablet Take 1 tablet (40 mg total) by mouth daily. 90 tablet 3  . sertraline (ZOLOFT) 100 MG tablet Take 1 tablet by mouth once daily 90 tablet 0  . tamsulosin (FLOMAX) 0.4 MG CAPS capsule Take 1 capsule by mouth twice daily 180 capsule 1  . TRESIBA FLEXTOUCH 100 UNIT/ML FlexTouch Pen INJECT 65 TO 80 UNITS SUBCUTANEOUSLY ONCE DAILY 15 mL 1  . Ultra Thin Lancets 31G MISC     . AMBULATORY NON FORMULARY MEDICATION Take 1 Scoop by mouth daily. Medication Name: Vital Reds 3.7grms daily     No facility-administered medications prior to visit.    Allergies  Allergen Reactions  . Statins Other (See Comments)    Other reaction(s): Other All statin  drugs Leg cramps when he was on atorvastatin and fenofibrate simultaneously. Discussed with his PCP Dr. Whalen on the phone ([phone]) All statin drugs Leg cramps when he was on atorvastatin and fenofibrate simultaneously. Discussed with his PCP Dr. Whalen on the phone ([phone]) All statin drugs   .   Eliquis [Apixaban] Other (See Comments)    Feels weak     ROS Review of Systems    Objective:    Physical Exam  Constitutional: He is oriented to person, place, and time. He appears well-developed and well-nourished.  HENT:  Head: Normocephalic and atraumatic.  Eyes: Conjunctivae and EOM are normal.  Cardiovascular: Normal rate.  Pulmonary/Chest: Effort normal.  Musculoskeletal:     Comments: Tender over the left greater trochanter.  Pain radiates down just slightly down towards the knee.  Neurological: He is alert and oriented to person, place, and time.  Skin: Skin is dry. No pallor.  Psychiatric: He has a normal mood and affect. His behavior is normal.  Vitals reviewed.   BP (!) 138/46   Pulse 61   Ht 5' 11" (1.803 m)   Wt 253 lb (114.8 kg)   SpO2 97%   BMI 35.29 kg/m  Wt Readings from Last 3 Encounters:  09/16/19 253 lb (114.8 kg)  06/07/19 247 lb (112 kg)  05/22/19 247 lb (112 kg)     Health Maintenance Due  Topic Date Due  . URINE MICROALBUMIN  08/23/2019    There are no preventive care reminders to display for this patient.  No results found for: TSH Lab Results  Component Value Date   WBC 6.6 09/20/2017   HGB 14.1 09/20/2017   HCT 40.9 09/20/2017   MCV 85.9 09/20/2017   PLT 179 09/20/2017   Lab Results  Component Value Date   NA 137 05/15/2019   K 4.2 05/15/2019   CO2 26 05/15/2019   GLUCOSE 272 (H) 05/15/2019   BUN 23 05/15/2019   CREATININE 1.46 (H) 05/15/2019   BILITOT 1.1 05/15/2019   AST 21 05/15/2019   ALT 18 05/15/2019   PROT 6.8 05/15/2019   CALCIUM 9.5 05/15/2019   Lab Results  Component Value Date   CHOL 121 05/15/2019    Lab Results  Component Value Date   HDL 31 (L) 05/15/2019   Lab Results  Component Value Date   LDLCALC 65 05/15/2019   Lab Results  Component Value Date   TRIG 170 (H) 05/15/2019   Lab Results  Component Value Date   CHOLHDL 3.9 05/15/2019   Lab Results  Component Value Date   HGBA1C 8.3 (A) 09/16/2019      Assessment & Plan:   Problem List Items Addressed This Visit      Cardiovascular and Mediastinum   Benign essential hypertension    Ok         Endocrine   Uncontrolled diabetes mellitus with complication, without long-term current use of insulin (HCC) - Primary    A1c improved to 8.3 from 8.6.  Looking at his blood glucose logs have gotten significantly better in the last 2 weeks.  He actually realized he was not getting his insulin because he was not taking the smaller needle.  He says that if his glucose was greater than 175 he was getting 50 units and if it was less than 130 he was only giving 35 units.  If it was in between he was using 40 units.  Advised taking.  He wants to know what he should be taking regularly.  He is really trying hard to cut back on sweets.  We discussed just using 38 units daily of Tresiba and will be discharged from there.      Relevant Orders   POCT glycosylated hemoglobin (Hb A1C) (Completed)     Nervous and Auditory  Idiopathic peripheral neuropathy    That he had injections in his knees and since then says that his knee pain has been significantly better but now he is noticing he has neuropathy more.  He would like to see if we could increase his gabapentin.  Discussed adding a midday dose of 300 mg to his regimen to see if this is helpful.        Other   Severe obesity (BMI 35.0-35.9 with comorbidity) (HCC)    Continue work on healthy diet and staying active.       Other Visit Diagnoses    Constipation, unspecified constipation type         Constipation-recommend a trial of MiraLAX.  Recommend 1 capful mixed with a  Glass of water at bedtime nightly.  Try it for at least a couple weeks.  If bowels becoming too loose and okay to decrease down to half a capful or even switching to every other night instead.  No orders of the defined types were placed in this encounter.   Follow-up: No follow-ups on file.    Catherine Metheney, MD 

## 2019-09-16 NOTE — Patient Instructions (Addendum)
Use 38 units of Tresiba daily.  Cut dose down to 20 units if glucose is less than 80.   Ok to increase you Gabapentin by taking an extra tab after lunch.

## 2019-09-21 ENCOUNTER — Other Ambulatory Visit: Payer: Self-pay | Admitting: Family Medicine

## 2019-09-25 DIAGNOSIS — M25762 Osteophyte, left knee: Secondary | ICD-10-CM | POA: Diagnosis not present

## 2019-09-26 DIAGNOSIS — M25761 Osteophyte, right knee: Secondary | ICD-10-CM | POA: Diagnosis not present

## 2019-09-27 ENCOUNTER — Encounter: Payer: Medicare Other | Admitting: Sports Medicine

## 2019-10-09 ENCOUNTER — Other Ambulatory Visit: Payer: Self-pay | Admitting: Family Medicine

## 2019-10-23 ENCOUNTER — Other Ambulatory Visit: Payer: Self-pay | Admitting: Physician Assistant

## 2019-10-30 ENCOUNTER — Other Ambulatory Visit: Payer: Self-pay | Admitting: Family Medicine

## 2019-10-30 DIAGNOSIS — R351 Nocturia: Secondary | ICD-10-CM

## 2019-10-30 DIAGNOSIS — N401 Enlarged prostate with lower urinary tract symptoms: Secondary | ICD-10-CM

## 2019-11-07 ENCOUNTER — Other Ambulatory Visit: Payer: Self-pay | Admitting: Family Medicine

## 2019-11-07 DIAGNOSIS — IMO0002 Reserved for concepts with insufficient information to code with codable children: Secondary | ICD-10-CM

## 2019-11-09 ENCOUNTER — Other Ambulatory Visit: Payer: Self-pay | Admitting: Physician Assistant

## 2019-11-18 ENCOUNTER — Other Ambulatory Visit: Payer: Self-pay | Admitting: Family Medicine

## 2019-11-25 ENCOUNTER — Other Ambulatory Visit: Payer: Self-pay | Admitting: Family Medicine

## 2019-12-03 ENCOUNTER — Other Ambulatory Visit: Payer: Self-pay | Admitting: Family Medicine

## 2019-12-03 DIAGNOSIS — R351 Nocturia: Secondary | ICD-10-CM

## 2019-12-03 DIAGNOSIS — N401 Enlarged prostate with lower urinary tract symptoms: Secondary | ICD-10-CM

## 2019-12-16 ENCOUNTER — Other Ambulatory Visit: Payer: Self-pay | Admitting: Family Medicine

## 2019-12-16 ENCOUNTER — Ambulatory Visit (INDEPENDENT_AMBULATORY_CARE_PROVIDER_SITE_OTHER): Payer: Medicare Other | Admitting: Family Medicine

## 2019-12-16 ENCOUNTER — Other Ambulatory Visit: Payer: Self-pay

## 2019-12-16 ENCOUNTER — Encounter: Payer: Self-pay | Admitting: Family Medicine

## 2019-12-16 VITALS — BP 145/65 | HR 58 | Ht 68.7 in | Wt 251.0 lb

## 2019-12-16 DIAGNOSIS — E1165 Type 2 diabetes mellitus with hyperglycemia: Secondary | ICD-10-CM | POA: Diagnosis not present

## 2019-12-16 DIAGNOSIS — IMO0002 Reserved for concepts with insufficient information to code with codable children: Secondary | ICD-10-CM

## 2019-12-16 DIAGNOSIS — E1121 Type 2 diabetes mellitus with diabetic nephropathy: Secondary | ICD-10-CM

## 2019-12-16 DIAGNOSIS — R7989 Other specified abnormal findings of blood chemistry: Secondary | ICD-10-CM | POA: Diagnosis not present

## 2019-12-16 DIAGNOSIS — E118 Type 2 diabetes mellitus with unspecified complications: Secondary | ICD-10-CM | POA: Diagnosis not present

## 2019-12-16 DIAGNOSIS — M1A09X0 Idiopathic chronic gout, multiple sites, without tophus (tophi): Secondary | ICD-10-CM

## 2019-12-16 DIAGNOSIS — I10 Essential (primary) hypertension: Secondary | ICD-10-CM

## 2019-12-16 DIAGNOSIS — M25552 Pain in left hip: Secondary | ICD-10-CM | POA: Diagnosis not present

## 2019-12-16 DIAGNOSIS — Z6835 Body mass index (BMI) 35.0-35.9, adult: Secondary | ICD-10-CM

## 2019-12-16 LAB — BASIC METABOLIC PANEL WITH GFR
BUN/Creatinine Ratio: 18 (calc) (ref 6–22)
BUN: 22 mg/dL (ref 7–25)
CO2: 27 mmol/L (ref 20–32)
Calcium: 9.2 mg/dL (ref 8.6–10.3)
Chloride: 101 mmol/L (ref 98–110)
Creat: 1.21 mg/dL — ABNORMAL HIGH (ref 0.70–1.18)
GFR, Est African American: 67 mL/min/{1.73_m2} (ref >=60)
GFR, Est Non African American: 57 mL/min/{1.73_m2} — ABNORMAL LOW (ref >=60)
Glucose, Bld: 128 mg/dL — ABNORMAL HIGH (ref 65–99)
Potassium: 4.7 mmol/L (ref 3.5–5.3)
Sodium: 135 mmol/L (ref 135–146)

## 2019-12-16 LAB — POCT UA - MICROALBUMIN
Albumin/Creatinine Ratio, Urine, POC: >300
Creatinine, POC: 200 mg/dL
Microalbumin Ur, POC: 150 mg/L

## 2019-12-16 LAB — POCT GLYCOSYLATED HEMOGLOBIN (HGB A1C): Hemoglobin A1C: 7.8 % — AB (ref 4.0–5.6)

## 2019-12-16 MED ORDER — LISINOPRIL 10 MG PO TABS: 10.0000 mg | ORAL_TABLET | Freq: Every day | ORAL | 3 refills | Status: DC

## 2019-12-16 NOTE — Patient Instructions (Signed)
Please increase your Tristan Mayer to 40 units. If you are going golfing then you can always give 38 units the night before you golf.  Since she will be outside in the heat and more active.

## 2019-12-16 NOTE — Assessment & Plan Note (Signed)
A1c improved today down to 7.8, from previous of 8.3.  Really congratulated him on the progress and work that he is made so far.  Increase Tresiba to 40 units.  Okay to give 87 the night before he goes golfing just avoid any hypoglycemia.  He still needs to continue to work on his diet he is made some good changes but still needs to continue to work on cutting back on his carb intake.  He is also planning on continuing to work on his weight.  He has lost 2 pounds since I last saw him.

## 2019-12-16 NOTE — Assessment & Plan Note (Signed)
BP elevated today. Will have him come back in 2 weeks to recheck BP

## 2019-12-16 NOTE — Assessment & Plan Note (Signed)
He has lost 2 pounds since I last saw him.  Letter to see him get his BMI back under 35 if at all possible again just encouraged him to continue to work on portion control and cutting back on carb intake.

## 2019-12-16 NOTE — Progress Notes (Signed)
Established Patient Office Visit  Subjective:  Patient ID: Tristan Mayer, male    DOB: 11-Jul-1940  Age: 79 y.o. MRN: [id number]  CC:  Chief Complaint  Patient presents with  . Diabetes    HPI Tristan Mayer presents for   Diabetes - no hypoglycemic events. No wounds or sores that are not healing well. No increased thirst or urination. Checking glucose at home. Taking medications as prescribed without any side effects.  He admits he could do better with his diet he just loves potatoes and bread and he knows every time he eats that it shoots his blood sugar up.  He says his blood sugars have been ranging between 101 175.  Normally there are more in the 150s on average.  Also been struggling with some left hip pain.  He says if he goes and plays golf it gets so sore that it almost hard for him to walk he will will hobble.  He is seeing an orthopedic specialist for this but wanted to know what was safe to take.  He says he will occasionally use Tylenol and it does help some.  He is on Xarelto so cannot use other NSAIDs.  He says if he is home and resting in the hip does not bother him that much its mostly over the lateral hip going into the groin crease.  He is also been taking turmeric. Recently had injections in his kness and says that has worked really well. They are considering a injection in the hip.    Past Medical History:  Diagnosis Date  . Atrial fibrillation ([place])   . CVA (cerebral vascular accident) ([place])   . Diabetes mellitus ([place])   . GERD (gastroesophageal reflux disease)   . Hyperlipidemia   . PUD (peptic ulcer disease)     Past Surgical History:  Procedure Laterality Date  . CATARACT EXTRACTION W/ INTRAOCULAR LENS IMPLANT Right     Family History  Problem Relation Age of Onset  . Prostate cancer Father 5  . Stroke Father   . Diabetes Brother   . Diabetes Brother   . Atrial fibrillation Mother     Social History   Socioeconomic History  . Marital status:  Married    Spouse name: Tristan Mayer  . Number of children: 3  . Years of education: Not on file  . Highest education level: 12th grade  Occupational History  . Occupation: part time driver     Comment: Therapist, [employer]   Tobacco Use  . Smoking status: Current Some Day Smoker    Types: Cigars  . Smokeless tobacco: Never Used  Substance and Sexual Activity  . Alcohol use: Yes    Comment: Occasional  . Drug use: Not on file  . Sexual activity: Not on file  Other Topics Concern  . Not on file  Social History Narrative  . Not on file   Social Determinants of Health   Financial Resource Strain:   . Difficulty of Paying Living Expenses:   Food Insecurity:   . Worried About Charity fundraiser in the Last Year:   . Arboriculturist in the Last Year:   Transportation Needs:   . Film/video editor (Medical):   Marland Kitchen Lack of Transportation (Non-Medical):   Physical Activity:   . Days of Exercise per Week:   . Minutes of Exercise per Session:   Stress:   . Feeling of Stress :   Social Connections:   . Frequency of  Communication with Friends and Family:   . Frequency of Social Gatherings with Friends and Family:   . Attends Religious Services:   . Active Member of Clubs or Organizations:   . Attends Archivist Meetings:   Marland Kitchen Marital Status:   Intimate Partner Violence:   . Fear of Current or Ex-Partner:   . Emotionally Abused:   Marland Kitchen Physically Abused:   . Sexually Abused:     Outpatient Medications Prior to Visit  Medication Sig Dispense Refill  . Blood Glucose Calibration (ACCU-CHEK GUIDE CONTROL) LIQD     . blood glucose meter kit and supplies Dispense based on patient and insurance preference. Use up to four times daily as directed. Dx: E11.8, E11.65 1 each 0  . Blood Glucose Monitoring Suppl (ONETOUCH VERIO REFLECT) w/Device KIT 1 each by Does not apply route 2 (two) times daily. Dx: E11.8, E11.65 - Check fasting blood sugar every morning and once 2 hours after  largest meal of the day. 1 kit prn  . Co-Enzyme Q-10 30 MG CAPS Take 400 mg by mouth daily.    Marland Kitchen diltiazem (CARDIZEM CD) 120 MG 24 hr capsule Take 120 mg by mouth daily.    . famotidine (PEPCID) 20 MG tablet Take 20 mg by mouth daily.    . finasteride (PROSCAR) 5 MG tablet TAKE 1 TABLET BY MOUTH AT BEDTIME 30 tablet 0  . gabapentin (NEURONTIN) 300 MG capsule TAKE 2 CAPSULES BY MOUTH IN THE MORNING AND 3 IN THE EVENING 450 capsule 0  . glipiZIDE (GLUCOTROL) 10 MG tablet TAKE 1 TABLET BY MOUTH TWICE DAILY BEFORE MEAL(S) 180 tablet 0  . glucose blood (ONETOUCH VERIO) test strip Dx: E11.8, E11.65 - Check fasting blood sugar every morning and once 2 hours after largest meal of the day. 100 each 12  . Insulin Pen Needle 29G X 12.7MM MISC Use BID. Dx Diabetes. Pt need long or extra long needle for his pens. 100 each 1  . Melatonin 10 MG TABS Take 1 tablet by mouth daily.    . metFORMIN (GLUCOPHAGE) 1000 MG tablet TAKE 1 TABLET BY MOUTH TWICE DAILY WITH MEALS 180 tablet 1  . Potassium 99 MG TABS Take 5 tablets by mouth daily.    . rosuvastatin (CRESTOR) 40 MG tablet Take 1 tablet (40 mg total) by mouth daily. 90 tablet 3  . sertraline (ZOLOFT) 100 MG tablet Take 1 tablet by mouth once daily 90 tablet 0  . tamsulosin (FLOMAX) 0.4 MG CAPS capsule Take 1 capsule by mouth twice daily 180 capsule 1  . TRESIBA FLEXTOUCH 100 UNIT/ML FlexTouch Pen INJECT 65 TO 80 UNITS SUBCUTANEOUSLY ONCE DAILY 15 mL 0  . Ultra Thin Lancets 31G MISC     . XARELTO 20 MG TABS tablet TAKE 1 TABLET BY MOUTH ONCE DAILY WITH SUPPER 90 tablet 1  . diltiazem (TIAZAC) 120 MG 24 hr capsule Take 1 capsule by mouth once daily 90 capsule 1   No facility-administered medications prior to visit.    Allergies  Allergen Reactions  . Statins Other (See Comments)    Other reaction(s): Other All statin drugs Leg cramps when he was on atorvastatin and fenofibrate simultaneously. Discussed with his PCP Dr. Remus Blake on the phone  [phone]) All statin drugs Leg cramps when he was on atorvastatin and fenofibrate simultaneously. Discussed with his PCP Dr. Remus Blake on the phone [phone]) All statin drugs   . Eliquis [Apixaban] Other (See Comments)    Feels weak     ROS  Review of Systems    Objective:    Physical Exam Constitutional:      Appearance: He is well-developed.  HENT:     Head: Normocephalic and atraumatic.  Cardiovascular:     Rate and Rhythm: Normal rate and regular rhythm.     Heart sounds: Normal heart sounds.  Pulmonary:     Effort: Pulmonary effort is normal.     Breath sounds: Normal breath sounds.  Musculoskeletal:     Comments: Unsteady on his feet today.  Having some balance issues.    Skin:    General: Skin is warm and dry.  Neurological:     Mental Status: He is alert and oriented to person, place, and time.  Psychiatric:        Behavior: Behavior normal.     BP (!) 145/65   Pulse (!) 58   Ht 5' 8.7" (1.745 m)   Wt 251 lb (113.9 kg)   SpO2 99%   BMI 37.39 kg/m  Wt Readings from Last 3 Encounters:  12/16/19 251 lb (113.9 kg)  09/16/19 253 lb (114.8 kg)  06/07/19 247 lb (112 kg)     Health Maintenance Due  Topic Date Due  . Hepatitis C Screening  Never done  . INFLUENZA VACCINE  12/01/2019    There are no preventive care reminders to display for this patient.  No results found for: TSH Lab Results  Component Value Date   WBC 6.6 09/20/2017   HGB 14.1 09/20/2017   HCT 40.9 09/20/2017   MCV 85.9 09/20/2017   PLT 179 09/20/2017   Lab Results  Component Value Date   NA 135 12/16/2019   K 4.7 12/16/2019   CO2 27 12/16/2019   GLUCOSE 128 (H) 12/16/2019   BUN 22 12/16/2019   CREATININE 1.21 (H) 12/16/2019   BILITOT 1.1 05/15/2019   AST 21 05/15/2019   ALT 18 05/15/2019   PROT 6.8 05/15/2019   CALCIUM 9.2 12/16/2019   Lab Results  Component Value Date   CHOL 121 05/15/2019   Lab Results  Component Value Date   HDL 31 (L) 05/15/2019   Lab  Results  Component Value Date   LDLCALC 65 05/15/2019   Lab Results  Component Value Date   TRIG 170 (H) 05/15/2019   Lab Results  Component Value Date   CHOLHDL 3.9 05/15/2019   Lab Results  Component Value Date   HGBA1C 7.8 (A) 12/16/2019      Assessment & Plan:   Problem List Items Addressed This Visit      Cardiovascular and Mediastinum   Benign essential hypertension    BP elevated today. Will have him come back in 2 weeks to recheck BP      Relevant Medications   diltiazem (CARDIZEM CD) 120 MG 24 hr capsule     Endocrine   Uncontrolled diabetes mellitus with complication, without long-term current use of insulin (HCC) - Primary    A1c improved today down to 7.8, from previous of 8.3.  Really congratulated him on the progress and work that he is made so far.  Increase Tresiba to 40 units.  Okay to give 46 the night before he goes golfing just avoid any hypoglycemia.  He still needs to continue to work on his diet he is made some good changes but still needs to continue to work on cutting back on his carb intake.  He is also planning on continuing to work on his weight.  He has lost 2 pounds  since I last saw him.      Relevant Orders   POCT glycosylated hemoglobin (Hb A1C) (Completed)   BASIC METABOLIC PANEL WITH GFR (Completed)   POCT UA - Microalbumin (Completed)     Other   Severe obesity (BMI 35.0-35.9 with comorbidity) ([place])    He has lost 2 pounds since I last saw him.  Letter to see him get his BMI back under 35 if at all possible again just encouraged him to continue to work on portion control and cutting back on carb intake.       Other Visit Diagnoses    Elevated serum creatinine       Relevant Orders   BASIC METABOLIC PANEL WITH GFR (Completed)   Left hip pain         Left hip pain-  Likely OA.  okay to use Tylenol he may even want to pretreat before golfing to see if this is helpful in the new take again at bedtime. Avoid NSAIDS since on Xarelto.    No orders of the defined types were placed in this encounter.   Follow-up: Return in about 3 months (around 03/17/2020) for Diabetes follow-up.   Time spent 25 min  Beatrice Lecher, MD

## 2019-12-20 ENCOUNTER — Other Ambulatory Visit: Payer: Self-pay

## 2019-12-20 DIAGNOSIS — E1121 Type 2 diabetes mellitus with diabetic nephropathy: Secondary | ICD-10-CM

## 2019-12-24 ENCOUNTER — Encounter: Payer: Self-pay | Admitting: Family Medicine

## 2019-12-27 ENCOUNTER — Other Ambulatory Visit: Payer: Self-pay | Admitting: Family Medicine

## 2020-01-02 ENCOUNTER — Other Ambulatory Visit: Payer: Self-pay | Admitting: Family Medicine

## 2020-01-02 DIAGNOSIS — N401 Enlarged prostate with lower urinary tract symptoms: Secondary | ICD-10-CM

## 2020-01-03 MED ORDER — LOSARTAN POTASSIUM 25 MG PO TABS: 25.0000 mg | ORAL_TABLET | Freq: Every day | ORAL | 1 refills | Status: DC

## 2020-01-03 NOTE — Addendum Note (Signed)
Addended by: Beatrice Lecher D on: 01/03/2020 11:03 AM   Modules accepted: Orders

## 2020-01-07 NOTE — Telephone Encounter (Signed)
Vaginal nurse visit about 2 weeks after changing medications.  Bring in home blood pressure log.

## 2020-01-10 ENCOUNTER — Other Ambulatory Visit: Payer: Self-pay | Admitting: *Deleted

## 2020-01-10 MED ORDER — RIVAROXABAN 20 MG PO TABS: ORAL_TABLET | ORAL | 3 refills | Status: DC

## 2020-01-15 ENCOUNTER — Other Ambulatory Visit: Payer: Self-pay | Admitting: *Deleted

## 2020-01-15 MED ORDER — LOSARTAN POTASSIUM 25 MG PO TABS: 25.0000 mg | ORAL_TABLET | Freq: Every day | ORAL | 0 refills | Status: DC

## 2020-01-23 DIAGNOSIS — L570 Actinic keratosis: Secondary | ICD-10-CM | POA: Diagnosis not present

## 2020-01-23 DIAGNOSIS — Z85828 Personal history of other malignant neoplasm of skin: Secondary | ICD-10-CM | POA: Diagnosis not present

## 2020-01-23 DIAGNOSIS — L820 Inflamed seborrheic keratosis: Secondary | ICD-10-CM | POA: Diagnosis not present

## 2020-01-23 DIAGNOSIS — L821 Other seborrheic keratosis: Secondary | ICD-10-CM | POA: Diagnosis not present

## 2020-01-23 DIAGNOSIS — D1801 Hemangioma of skin and subcutaneous tissue: Secondary | ICD-10-CM | POA: Diagnosis not present

## 2020-01-23 DIAGNOSIS — Z86018 Personal history of other benign neoplasm: Secondary | ICD-10-CM | POA: Diagnosis not present

## 2020-01-27 DIAGNOSIS — S336XXA Sprain of sacroiliac joint, initial encounter: Secondary | ICD-10-CM | POA: Diagnosis not present

## 2020-01-27 DIAGNOSIS — M9903 Segmental and somatic dysfunction of lumbar region: Secondary | ICD-10-CM | POA: Diagnosis not present

## 2020-01-27 DIAGNOSIS — M25552 Pain in left hip: Secondary | ICD-10-CM | POA: Diagnosis not present

## 2020-01-27 DIAGNOSIS — M9906 Segmental and somatic dysfunction of lower extremity: Secondary | ICD-10-CM | POA: Diagnosis not present

## 2020-01-27 DIAGNOSIS — M25551 Pain in right hip: Secondary | ICD-10-CM | POA: Diagnosis not present

## 2020-01-27 DIAGNOSIS — M4726 Other spondylosis with radiculopathy, lumbar region: Secondary | ICD-10-CM | POA: Diagnosis not present

## 2020-01-27 DIAGNOSIS — M9904 Segmental and somatic dysfunction of sacral region: Secondary | ICD-10-CM | POA: Diagnosis not present

## 2020-01-28 DIAGNOSIS — M9903 Segmental and somatic dysfunction of lumbar region: Secondary | ICD-10-CM | POA: Diagnosis not present

## 2020-01-28 DIAGNOSIS — M25552 Pain in left hip: Secondary | ICD-10-CM | POA: Diagnosis not present

## 2020-01-28 DIAGNOSIS — M9906 Segmental and somatic dysfunction of lower extremity: Secondary | ICD-10-CM | POA: Diagnosis not present

## 2020-01-28 DIAGNOSIS — S336XXA Sprain of sacroiliac joint, initial encounter: Secondary | ICD-10-CM | POA: Diagnosis not present

## 2020-01-28 DIAGNOSIS — M25551 Pain in right hip: Secondary | ICD-10-CM | POA: Diagnosis not present

## 2020-01-28 DIAGNOSIS — M9904 Segmental and somatic dysfunction of sacral region: Secondary | ICD-10-CM | POA: Diagnosis not present

## 2020-01-28 DIAGNOSIS — M4726 Other spondylosis with radiculopathy, lumbar region: Secondary | ICD-10-CM | POA: Diagnosis not present

## 2020-01-29 DIAGNOSIS — M25551 Pain in right hip: Secondary | ICD-10-CM | POA: Diagnosis not present

## 2020-01-29 DIAGNOSIS — M25552 Pain in left hip: Secondary | ICD-10-CM | POA: Diagnosis not present

## 2020-01-29 DIAGNOSIS — S336XXA Sprain of sacroiliac joint, initial encounter: Secondary | ICD-10-CM | POA: Diagnosis not present

## 2020-01-29 DIAGNOSIS — M4726 Other spondylosis with radiculopathy, lumbar region: Secondary | ICD-10-CM | POA: Diagnosis not present

## 2020-01-29 DIAGNOSIS — M9904 Segmental and somatic dysfunction of sacral region: Secondary | ICD-10-CM | POA: Diagnosis not present

## 2020-01-29 DIAGNOSIS — M9903 Segmental and somatic dysfunction of lumbar region: Secondary | ICD-10-CM | POA: Diagnosis not present

## 2020-01-29 DIAGNOSIS — M9906 Segmental and somatic dysfunction of lower extremity: Secondary | ICD-10-CM | POA: Diagnosis not present

## 2020-02-04 DIAGNOSIS — M4726 Other spondylosis with radiculopathy, lumbar region: Secondary | ICD-10-CM | POA: Diagnosis not present

## 2020-02-04 DIAGNOSIS — S336XXA Sprain of sacroiliac joint, initial encounter: Secondary | ICD-10-CM | POA: Diagnosis not present

## 2020-02-04 DIAGNOSIS — M9904 Segmental and somatic dysfunction of sacral region: Secondary | ICD-10-CM | POA: Diagnosis not present

## 2020-02-04 DIAGNOSIS — M25551 Pain in right hip: Secondary | ICD-10-CM | POA: Diagnosis not present

## 2020-02-04 DIAGNOSIS — M9906 Segmental and somatic dysfunction of lower extremity: Secondary | ICD-10-CM | POA: Diagnosis not present

## 2020-02-04 DIAGNOSIS — M25552 Pain in left hip: Secondary | ICD-10-CM | POA: Diagnosis not present

## 2020-02-04 DIAGNOSIS — M9903 Segmental and somatic dysfunction of lumbar region: Secondary | ICD-10-CM | POA: Diagnosis not present

## 2020-02-05 DIAGNOSIS — M9906 Segmental and somatic dysfunction of lower extremity: Secondary | ICD-10-CM | POA: Diagnosis not present

## 2020-02-05 DIAGNOSIS — S336XXA Sprain of sacroiliac joint, initial encounter: Secondary | ICD-10-CM | POA: Diagnosis not present

## 2020-02-05 DIAGNOSIS — M9904 Segmental and somatic dysfunction of sacral region: Secondary | ICD-10-CM | POA: Diagnosis not present

## 2020-02-05 DIAGNOSIS — M9903 Segmental and somatic dysfunction of lumbar region: Secondary | ICD-10-CM | POA: Diagnosis not present

## 2020-02-05 DIAGNOSIS — M4726 Other spondylosis with radiculopathy, lumbar region: Secondary | ICD-10-CM | POA: Diagnosis not present

## 2020-02-05 DIAGNOSIS — M25552 Pain in left hip: Secondary | ICD-10-CM | POA: Diagnosis not present

## 2020-02-05 DIAGNOSIS — M25551 Pain in right hip: Secondary | ICD-10-CM | POA: Diagnosis not present

## 2020-02-06 DIAGNOSIS — M9906 Segmental and somatic dysfunction of lower extremity: Secondary | ICD-10-CM | POA: Diagnosis not present

## 2020-02-06 DIAGNOSIS — M4726 Other spondylosis with radiculopathy, lumbar region: Secondary | ICD-10-CM | POA: Diagnosis not present

## 2020-02-06 DIAGNOSIS — M9904 Segmental and somatic dysfunction of sacral region: Secondary | ICD-10-CM | POA: Diagnosis not present

## 2020-02-06 DIAGNOSIS — S336XXA Sprain of sacroiliac joint, initial encounter: Secondary | ICD-10-CM | POA: Diagnosis not present

## 2020-02-06 DIAGNOSIS — M25551 Pain in right hip: Secondary | ICD-10-CM | POA: Diagnosis not present

## 2020-02-06 DIAGNOSIS — M25552 Pain in left hip: Secondary | ICD-10-CM | POA: Diagnosis not present

## 2020-02-06 DIAGNOSIS — M9903 Segmental and somatic dysfunction of lumbar region: Secondary | ICD-10-CM | POA: Diagnosis not present

## 2020-02-10 DIAGNOSIS — M25552 Pain in left hip: Secondary | ICD-10-CM | POA: Diagnosis not present

## 2020-02-10 DIAGNOSIS — M4726 Other spondylosis with radiculopathy, lumbar region: Secondary | ICD-10-CM | POA: Diagnosis not present

## 2020-02-10 DIAGNOSIS — M25551 Pain in right hip: Secondary | ICD-10-CM | POA: Diagnosis not present

## 2020-02-10 DIAGNOSIS — M9906 Segmental and somatic dysfunction of lower extremity: Secondary | ICD-10-CM | POA: Diagnosis not present

## 2020-02-10 DIAGNOSIS — S336XXA Sprain of sacroiliac joint, initial encounter: Secondary | ICD-10-CM | POA: Diagnosis not present

## 2020-02-10 DIAGNOSIS — M9903 Segmental and somatic dysfunction of lumbar region: Secondary | ICD-10-CM | POA: Diagnosis not present

## 2020-02-10 DIAGNOSIS — M9904 Segmental and somatic dysfunction of sacral region: Secondary | ICD-10-CM | POA: Diagnosis not present

## 2020-02-11 DIAGNOSIS — M4726 Other spondylosis with radiculopathy, lumbar region: Secondary | ICD-10-CM | POA: Diagnosis not present

## 2020-02-11 DIAGNOSIS — M9906 Segmental and somatic dysfunction of lower extremity: Secondary | ICD-10-CM | POA: Diagnosis not present

## 2020-02-11 DIAGNOSIS — M9904 Segmental and somatic dysfunction of sacral region: Secondary | ICD-10-CM | POA: Diagnosis not present

## 2020-02-11 DIAGNOSIS — M25552 Pain in left hip: Secondary | ICD-10-CM | POA: Diagnosis not present

## 2020-02-11 DIAGNOSIS — M9903 Segmental and somatic dysfunction of lumbar region: Secondary | ICD-10-CM | POA: Diagnosis not present

## 2020-02-11 DIAGNOSIS — S336XXA Sprain of sacroiliac joint, initial encounter: Secondary | ICD-10-CM | POA: Diagnosis not present

## 2020-02-11 DIAGNOSIS — M25551 Pain in right hip: Secondary | ICD-10-CM | POA: Diagnosis not present

## 2020-02-12 ENCOUNTER — Other Ambulatory Visit: Payer: Self-pay | Admitting: Family Medicine

## 2020-02-12 DIAGNOSIS — S336XXA Sprain of sacroiliac joint, initial encounter: Secondary | ICD-10-CM | POA: Diagnosis not present

## 2020-02-12 DIAGNOSIS — M25551 Pain in right hip: Secondary | ICD-10-CM | POA: Diagnosis not present

## 2020-02-12 DIAGNOSIS — M4726 Other spondylosis with radiculopathy, lumbar region: Secondary | ICD-10-CM | POA: Diagnosis not present

## 2020-02-12 DIAGNOSIS — M9906 Segmental and somatic dysfunction of lower extremity: Secondary | ICD-10-CM | POA: Diagnosis not present

## 2020-02-12 DIAGNOSIS — M25552 Pain in left hip: Secondary | ICD-10-CM | POA: Diagnosis not present

## 2020-02-12 DIAGNOSIS — M9903 Segmental and somatic dysfunction of lumbar region: Secondary | ICD-10-CM | POA: Diagnosis not present

## 2020-02-12 DIAGNOSIS — M9904 Segmental and somatic dysfunction of sacral region: Secondary | ICD-10-CM | POA: Diagnosis not present

## 2020-02-13 DIAGNOSIS — M9903 Segmental and somatic dysfunction of lumbar region: Secondary | ICD-10-CM | POA: Diagnosis not present

## 2020-02-13 DIAGNOSIS — S336XXA Sprain of sacroiliac joint, initial encounter: Secondary | ICD-10-CM | POA: Diagnosis not present

## 2020-02-13 DIAGNOSIS — M4726 Other spondylosis with radiculopathy, lumbar region: Secondary | ICD-10-CM | POA: Diagnosis not present

## 2020-02-13 DIAGNOSIS — M25552 Pain in left hip: Secondary | ICD-10-CM | POA: Diagnosis not present

## 2020-02-13 DIAGNOSIS — M9904 Segmental and somatic dysfunction of sacral region: Secondary | ICD-10-CM | POA: Diagnosis not present

## 2020-02-13 DIAGNOSIS — M9906 Segmental and somatic dysfunction of lower extremity: Secondary | ICD-10-CM | POA: Diagnosis not present

## 2020-02-13 DIAGNOSIS — M25551 Pain in right hip: Secondary | ICD-10-CM | POA: Diagnosis not present

## 2020-02-17 ENCOUNTER — Telehealth: Payer: Self-pay

## 2020-02-17 DIAGNOSIS — S336XXA Sprain of sacroiliac joint, initial encounter: Secondary | ICD-10-CM | POA: Diagnosis not present

## 2020-02-17 DIAGNOSIS — M25552 Pain in left hip: Secondary | ICD-10-CM | POA: Diagnosis not present

## 2020-02-17 DIAGNOSIS — M4726 Other spondylosis with radiculopathy, lumbar region: Secondary | ICD-10-CM | POA: Diagnosis not present

## 2020-02-17 DIAGNOSIS — M25551 Pain in right hip: Secondary | ICD-10-CM | POA: Diagnosis not present

## 2020-02-17 DIAGNOSIS — M9904 Segmental and somatic dysfunction of sacral region: Secondary | ICD-10-CM | POA: Diagnosis not present

## 2020-02-17 DIAGNOSIS — M9906 Segmental and somatic dysfunction of lower extremity: Secondary | ICD-10-CM | POA: Diagnosis not present

## 2020-02-17 DIAGNOSIS — M9903 Segmental and somatic dysfunction of lumbar region: Secondary | ICD-10-CM | POA: Diagnosis not present

## 2020-02-17 NOTE — Telephone Encounter (Signed)
Patient called, having tooth pulled on Wednesday, wants to know if he needs to change anything with his blood thinner.   Takes Xarelto 20 mg QD

## 2020-02-18 DIAGNOSIS — M25551 Pain in right hip: Secondary | ICD-10-CM | POA: Diagnosis not present

## 2020-02-18 DIAGNOSIS — M9906 Segmental and somatic dysfunction of lower extremity: Secondary | ICD-10-CM | POA: Diagnosis not present

## 2020-02-18 DIAGNOSIS — M9904 Segmental and somatic dysfunction of sacral region: Secondary | ICD-10-CM | POA: Diagnosis not present

## 2020-02-18 DIAGNOSIS — M4726 Other spondylosis with radiculopathy, lumbar region: Secondary | ICD-10-CM | POA: Diagnosis not present

## 2020-02-18 DIAGNOSIS — S336XXA Sprain of sacroiliac joint, initial encounter: Secondary | ICD-10-CM | POA: Diagnosis not present

## 2020-02-18 DIAGNOSIS — M9903 Segmental and somatic dysfunction of lumbar region: Secondary | ICD-10-CM | POA: Diagnosis not present

## 2020-02-18 DIAGNOSIS — M25552 Pain in left hip: Secondary | ICD-10-CM | POA: Diagnosis not present

## 2020-02-18 NOTE — Telephone Encounter (Signed)
Ok to hold the xarelto the day before ( Tues) and the day of (Wed and restart either Wed night or Thur AM. Not sure when he takes his pill

## 2020-02-18 NOTE — Telephone Encounter (Signed)
Patient takes with supper.  Has been updated on directions.

## 2020-02-20 DIAGNOSIS — M4726 Other spondylosis with radiculopathy, lumbar region: Secondary | ICD-10-CM | POA: Diagnosis not present

## 2020-02-20 DIAGNOSIS — M25552 Pain in left hip: Secondary | ICD-10-CM | POA: Diagnosis not present

## 2020-02-20 DIAGNOSIS — M9904 Segmental and somatic dysfunction of sacral region: Secondary | ICD-10-CM | POA: Diagnosis not present

## 2020-02-20 DIAGNOSIS — M25551 Pain in right hip: Secondary | ICD-10-CM | POA: Diagnosis not present

## 2020-02-20 DIAGNOSIS — S336XXA Sprain of sacroiliac joint, initial encounter: Secondary | ICD-10-CM | POA: Diagnosis not present

## 2020-02-20 DIAGNOSIS — M9906 Segmental and somatic dysfunction of lower extremity: Secondary | ICD-10-CM | POA: Diagnosis not present

## 2020-02-20 DIAGNOSIS — M9903 Segmental and somatic dysfunction of lumbar region: Secondary | ICD-10-CM | POA: Diagnosis not present

## 2020-02-24 DIAGNOSIS — M9906 Segmental and somatic dysfunction of lower extremity: Secondary | ICD-10-CM | POA: Diagnosis not present

## 2020-02-24 DIAGNOSIS — M9903 Segmental and somatic dysfunction of lumbar region: Secondary | ICD-10-CM | POA: Diagnosis not present

## 2020-02-24 DIAGNOSIS — M25551 Pain in right hip: Secondary | ICD-10-CM | POA: Diagnosis not present

## 2020-02-24 DIAGNOSIS — M9904 Segmental and somatic dysfunction of sacral region: Secondary | ICD-10-CM | POA: Diagnosis not present

## 2020-02-24 DIAGNOSIS — M4726 Other spondylosis with radiculopathy, lumbar region: Secondary | ICD-10-CM | POA: Diagnosis not present

## 2020-02-24 DIAGNOSIS — S336XXA Sprain of sacroiliac joint, initial encounter: Secondary | ICD-10-CM | POA: Diagnosis not present

## 2020-02-24 DIAGNOSIS — M25552 Pain in left hip: Secondary | ICD-10-CM | POA: Diagnosis not present

## 2020-02-25 DIAGNOSIS — S336XXA Sprain of sacroiliac joint, initial encounter: Secondary | ICD-10-CM | POA: Diagnosis not present

## 2020-02-25 DIAGNOSIS — M25551 Pain in right hip: Secondary | ICD-10-CM | POA: Diagnosis not present

## 2020-02-25 DIAGNOSIS — M9906 Segmental and somatic dysfunction of lower extremity: Secondary | ICD-10-CM | POA: Diagnosis not present

## 2020-02-25 DIAGNOSIS — M25552 Pain in left hip: Secondary | ICD-10-CM | POA: Diagnosis not present

## 2020-02-25 DIAGNOSIS — M4726 Other spondylosis with radiculopathy, lumbar region: Secondary | ICD-10-CM | POA: Diagnosis not present

## 2020-02-25 DIAGNOSIS — M9904 Segmental and somatic dysfunction of sacral region: Secondary | ICD-10-CM | POA: Diagnosis not present

## 2020-02-25 DIAGNOSIS — M9903 Segmental and somatic dysfunction of lumbar region: Secondary | ICD-10-CM | POA: Diagnosis not present

## 2020-02-26 DIAGNOSIS — M25551 Pain in right hip: Secondary | ICD-10-CM | POA: Diagnosis not present

## 2020-02-26 DIAGNOSIS — Z23 Encounter for immunization: Secondary | ICD-10-CM | POA: Diagnosis not present

## 2020-02-26 DIAGNOSIS — M9906 Segmental and somatic dysfunction of lower extremity: Secondary | ICD-10-CM | POA: Diagnosis not present

## 2020-02-26 DIAGNOSIS — M25552 Pain in left hip: Secondary | ICD-10-CM | POA: Diagnosis not present

## 2020-02-26 DIAGNOSIS — S336XXA Sprain of sacroiliac joint, initial encounter: Secondary | ICD-10-CM | POA: Diagnosis not present

## 2020-02-26 DIAGNOSIS — M9904 Segmental and somatic dysfunction of sacral region: Secondary | ICD-10-CM | POA: Diagnosis not present

## 2020-02-26 DIAGNOSIS — M4726 Other spondylosis with radiculopathy, lumbar region: Secondary | ICD-10-CM | POA: Diagnosis not present

## 2020-02-26 DIAGNOSIS — M9903 Segmental and somatic dysfunction of lumbar region: Secondary | ICD-10-CM | POA: Diagnosis not present

## 2020-03-02 DIAGNOSIS — M4726 Other spondylosis with radiculopathy, lumbar region: Secondary | ICD-10-CM | POA: Diagnosis not present

## 2020-03-02 DIAGNOSIS — M25552 Pain in left hip: Secondary | ICD-10-CM | POA: Diagnosis not present

## 2020-03-02 DIAGNOSIS — M9904 Segmental and somatic dysfunction of sacral region: Secondary | ICD-10-CM | POA: Diagnosis not present

## 2020-03-02 DIAGNOSIS — M25551 Pain in right hip: Secondary | ICD-10-CM | POA: Diagnosis not present

## 2020-03-02 DIAGNOSIS — M9906 Segmental and somatic dysfunction of lower extremity: Secondary | ICD-10-CM | POA: Diagnosis not present

## 2020-03-02 DIAGNOSIS — M9903 Segmental and somatic dysfunction of lumbar region: Secondary | ICD-10-CM | POA: Diagnosis not present

## 2020-03-02 DIAGNOSIS — S336XXA Sprain of sacroiliac joint, initial encounter: Secondary | ICD-10-CM | POA: Diagnosis not present

## 2020-03-05 ENCOUNTER — Other Ambulatory Visit: Payer: Self-pay | Admitting: Family Medicine

## 2020-03-05 DIAGNOSIS — E1165 Type 2 diabetes mellitus with hyperglycemia: Secondary | ICD-10-CM

## 2020-03-05 DIAGNOSIS — IMO0002 Reserved for concepts with insufficient information to code with codable children: Secondary | ICD-10-CM

## 2020-03-18 ENCOUNTER — Encounter: Payer: Self-pay | Admitting: Family Medicine

## 2020-03-18 ENCOUNTER — Ambulatory Visit (INDEPENDENT_AMBULATORY_CARE_PROVIDER_SITE_OTHER): Payer: Medicare Other | Admitting: Family Medicine

## 2020-03-18 ENCOUNTER — Other Ambulatory Visit: Payer: Self-pay

## 2020-03-18 VITALS — BP 136/69 | HR 53 | Ht 69.0 in | Wt 248.0 lb

## 2020-03-18 DIAGNOSIS — I10 Essential (primary) hypertension: Secondary | ICD-10-CM

## 2020-03-18 DIAGNOSIS — E118 Type 2 diabetes mellitus with unspecified complications: Secondary | ICD-10-CM

## 2020-03-18 DIAGNOSIS — IMO0002 Reserved for concepts with insufficient information to code with codable children: Secondary | ICD-10-CM

## 2020-03-18 DIAGNOSIS — E1165 Type 2 diabetes mellitus with hyperglycemia: Secondary | ICD-10-CM

## 2020-03-18 LAB — POCT GLYCOSYLATED HEMOGLOBIN (HGB A1C): Hemoglobin A1C: 8.4 % — AB (ref 4.0–5.6)

## 2020-03-18 NOTE — Progress Notes (Signed)
Established Patient Office Visit  Subjective:  Patient ID: Tristan Mayer, male    DOB: 1941-01-07  Age: 79 y.o. MRN: [id number]  CC:  Chief Complaint  Patient presents with  . Diabetes    HPI Tristan Mayer presents for   Diabetes - no hypoglycemic events. No wounds or sores that are not healing well. No increased thirst or urination. Checking glucose at home.  Reports blood sugars have been ranging from 10 5-2 31.  He says that his highest numbers were when he ate a carrot cake.  He says he knows he was just supposed to have a slice but ended up eating the whole cake.  He says he definitely notices that if he eats sweets a couple days in a row it just really drops his sugars up.  Taking medications as prescribed without any side effects. Usually uses 40 units except on golfing day and give 38 on those days. Due f  or foot exam today.  Referral for eye appointment which is due next month.  Is also been eating a fair amount of noodles instead of vegetables.  Hypertension- Pt denies chest pain, SOB, dizziness, or heart palpitations.  Taking meds as directed w/o problems.  Denies medication side effects.  Now on Cardizem and that is doing well.   Still enjoying playing golf.  Past Medical History:  Diagnosis Date  . Atrial fibrillation ([place])   . CVA (cerebral vascular accident) ([place])   . Diabetes mellitus ([place])   . GERD (gastroesophageal reflux disease)   . Hyperlipidemia   . PUD (peptic ulcer disease)     Past Surgical History:  Procedure Laterality Date  . CATARACT EXTRACTION W/ INTRAOCULAR LENS IMPLANT Right     Family History  Problem Relation Age of Onset  . Prostate cancer Father 10  . Stroke Father   . Diabetes Brother   . Diabetes Brother   . Atrial fibrillation Mother     Social History   Socioeconomic History  . Marital status: Married    Spouse name: [name]  . Number of children: 3  . Years of education: Not on file  . Highest education level: 12th grade   Occupational History  . Occupation: part time driver     Comment: Therapist, occupational and [employer]   Tobacco Use  . Smoking status: Current Some Day Smoker    Types: Cigars  . Smokeless tobacco: Never Used  Substance and Sexual Activity  . Alcohol use: Yes    Comment: Occasional  . Drug use: Not on file  . Sexual activity: Not on file  Other Topics Concern  . Not on file  Social History Narrative  . Not on file   Social Determinants of Health   Financial Resource Strain:   . Difficulty of Paying Living Expenses: Not on file  Food Insecurity:   . Worried About Charity fundraiser in the Last Year: Not on file  . Ran Out of Food in the Last Year: Not on file  Transportation Needs:   . Lack of Transportation (Medical): Not on file  . Lack of Transportation (Non-Medical): Not on file  Physical Activity:   . Days of Exercise per Week: Not on file  . Minutes of Exercise per Session: Not on file  Stress:   . Feeling of Stress : Not on file  Social Connections:   . Frequency of Communication with Friends and Family: Not on file  . Frequency of Social Gatherings with Friends and Family:  Not on file  . Attends Religious Services: Not on file  . Active Member of Clubs or Organizations: Not on file  . Attends Archivist Meetings: Not on file  . Marital Status: Not on file  Intimate Partner Violence:   . Fear of Current or Ex-Partner: Not on file  . Emotionally Abused: Not on file  . Physically Abused: Not on file  . Sexually Abused: Not on file    Outpatient Medications Prior to Visit  Medication Sig Dispense Refill  . Blood Glucose Calibration (ACCU-CHEK GUIDE CONTROL) LIQD     . blood glucose meter kit and supplies Dispense based on patient and insurance preference. Use up to four times daily as directed. Dx: E11.8, E11.65 1 each 0  . Blood Glucose Monitoring Suppl (ONETOUCH VERIO REFLECT) w/Device KIT 1 each by Does not apply route 2 (two) times daily. Dx: E11.8, E11.65 -  Check fasting blood sugar every morning and once 2 hours after largest meal of the day. 1 kit prn  . Co-Enzyme Q-10 30 MG CAPS Take 400 mg by mouth daily.    Marland Kitchen diltiazem (CARDIZEM CD) 120 MG 24 hr capsule Take 120 mg by mouth daily.    . famotidine (PEPCID) 20 MG tablet Take 20 mg by mouth daily.    . finasteride (PROSCAR) 5 MG tablet TAKE 1 TABLET BY MOUTH AT BEDTIME 90 tablet 3  . gabapentin (NEURONTIN) 300 MG capsule TAKE 2 CAPSULES BY MOUTH IN THE MORNING AND 3 IN THE EVENING 450 capsule 1  . glipiZIDE (GLUCOTROL) 10 MG tablet TAKE 1 TABLET BY MOUTH TWICE DAILY BEFORE MEAL(S) 180 tablet 1  . glucose blood (ONETOUCH VERIO) test strip Dx: E11.8, E11.65 - Check fasting blood sugar every morning and once 2 hours after largest meal of the day. 100 each 12  . Insulin Pen Needle (BD ULTRA-FINE PEN NEEDLES) 29G X 12.7MM MISC USE 1  TWICE DAILY WITH PENS 100 each prn  . losartan (COZAAR) 25 MG tablet Take 1 tablet (25 mg total) by mouth daily. 90 tablet 0  . Melatonin 10 MG TABS Take 1 tablet by mouth daily.    . metFORMIN (GLUCOPHAGE) 1000 MG tablet TAKE 1 TABLET BY MOUTH TWICE DAILY WITH MEALS 180 tablet 0  . Potassium 99 MG TABS Take 5 tablets by mouth daily.    . rivaroxaban (XARELTO) 20 MG TABS tablet TAKE 1 TABLET BY MOUTH ONCE DAILY WITH SUPPER 90 tablet 3  . rosuvastatin (CRESTOR) 40 MG tablet Take 1 tablet by mouth once daily 90 tablet 0  . sertraline (ZOLOFT) 100 MG tablet Take 1 tablet by mouth once daily 90 tablet 0  . tamsulosin (FLOMAX) 0.4 MG CAPS capsule Take 1 capsule by mouth twice daily 180 capsule 0  . TRESIBA FLEXTOUCH 100 UNIT/ML FlexTouch Pen INJECT 65 TO 80 UNITS SUBCUTANEOUSLY ONCE DAILY 15 mL 2  . Ultra Thin Lancets 31G MISC      No facility-administered medications prior to visit.    Allergies  Allergen Reactions  . Statins Other (See Comments)    Other reaction(s): Other All statin drugs Leg cramps when he was on atorvastatin and fenofibrate simultaneously.  Discussed with his PCP Dr. Remus Blake on the phone [phone]) All statin drugs Leg cramps when he was on atorvastatin and fenofibrate simultaneously. Discussed with his PCP Dr. Remus Blake on the phone [phone]) All statin drugs   . Eliquis [Apixaban] Other (See Comments)    Feels weak   . Lisinopril Other (See Comments)  Weakness    ROS Review of Systems    Objective:    Physical Exam Constitutional:      Appearance: He is well-developed.  HENT:     Head: Normocephalic and atraumatic.  Cardiovascular:     Rate and Rhythm: Normal rate and regular rhythm.     Heart sounds: Normal heart sounds.  Pulmonary:     Effort: Pulmonary effort is normal.     Breath sounds: Normal breath sounds.  Skin:    General: Skin is warm and dry.  Neurological:     Mental Status: He is alert and oriented to person, place, and time.  Psychiatric:        Behavior: Behavior normal.     BP 136/69   Pulse (!) 53   Ht 5' 9"  (1.753 m)   Wt 248 lb (112.5 kg)   SpO2 96%   BMI 36.62 kg/m  Wt Readings from Last 3 Encounters:  03/18/20 248 lb (112.5 kg)  12/16/19 251 lb (113.9 kg)  09/16/19 253 lb (114.8 kg)     Health Maintenance Due  Topic Date Due  . Hepatitis C Screening  Never done    There are no preventive care reminders to display for this patient.  No results found for: TSH Lab Results  Component Value Date   WBC 6.6 09/20/2017   HGB 14.1 09/20/2017   HCT 40.9 09/20/2017   MCV 85.9 09/20/2017   PLT 179 09/20/2017   Lab Results  Component Value Date   NA 135 12/16/2019   K 4.7 12/16/2019   CO2 27 12/16/2019   GLUCOSE 128 (H) 12/16/2019   BUN 22 12/16/2019   CREATININE 1.21 (H) 12/16/2019   BILITOT 1.1 05/15/2019   AST 21 05/15/2019   ALT 18 05/15/2019   PROT 6.8 05/15/2019   CALCIUM 9.2 12/16/2019   Lab Results  Component Value Date   CHOL 121 05/15/2019   Lab Results  Component Value Date   HDL 31 (L) 05/15/2019   Lab Results  Component Value Date    LDLCALC 65 05/15/2019   Lab Results  Component Value Date   TRIG 170 (H) 05/15/2019   Lab Results  Component Value Date   CHOLHDL 3.9 05/15/2019   Lab Results  Component Value Date   HGBA1C 8.4 (A) 03/18/2020      Assessment & Plan:   Problem List Items Addressed This Visit      Cardiovascular and Mediastinum   Benign essential hypertension    Pressure looks better controlled with the addition of the Cardizem.  Continue current regimen he is tolerating it well.        Endocrine   Uncontrolled diabetes mellitus with complication, without long-term current use of insulin ([place]) - Primary    A1c is uncontrolled in fact his A1c went up from 7.8 in August to 8.4 today.  I asked him what changed and he said it is mostly his diet he has not been as good.  We discussed Trelegy is around trying to get more vegetables in the diet by using some of th steamable pack that do not have sauce or  extra seasoning on them instead of using noodles.  We also discussed that if he is going to eat something sweet to eat it earlier in the day and not after dinnertime.  And to really make sure that he is portion controlling.  I am a little hesitant to increase his insulin because he is having some blood sugars in  the low 100s.  Again just continue work on diet list see if we can get the A1c back down under 8 between now and then and then we can make some adjustments after that.  Continue to work on healthy diet and weight loss.      Relevant Orders   POCT glycosylated hemoglobin (Hb A1C) (Completed)   Ambulatory referral to Optometry     Other   Morbid obesity ([place])    BMI 36 but he is down about 3 pounds just encouraged him to really continue to work on healthy diet and regular exercise.         Referral to optometry placed today.  Foot exam performed today.  No orders of the defined types were placed in this encounter.   Follow-up: Return in about 3 months (around 06/18/2020) for Diabetes  follow-up, Hypertension.   I spent 30 minutes on the day of the encounter to include pre-visit record review, face-to-face time with the patient and post visit ordering of test.   Beatrice Lecher, MD

## 2020-03-18 NOTE — Assessment & Plan Note (Addendum)
A1c is uncontrolled in fact his A1c went up from 7.8 in August to 8.4 today.  I asked him what changed and he said it is mostly his diet he has not been as good.  We discussed Trelegy is around trying to get more vegetables in the diet by using some of th steamable pack that do not have sauce or  extra seasoning on them instead of using noodles.  We also discussed that if he is going to eat something sweet to eat it earlier in the day and not after dinnertime.  And to really make sure that he is portion controlling.  I am a little hesitant to increase his insulin because he is having some blood sugars in the low 100s.  Again just continue work on diet list see if we can get the A1c back down under 8 between now and then and then we can make some adjustments after that.  Continue to work on healthy diet and weight loss.

## 2020-03-18 NOTE — Assessment & Plan Note (Addendum)
BMI 36 but he is down about 3 pounds just encouraged him to really continue to work on healthy diet and regular exercise.

## 2020-03-18 NOTE — Assessment & Plan Note (Signed)
Pressure looks better controlled with the addition of the Cardizem.  Continue current regimen he is tolerating it well.

## 2020-04-27 ENCOUNTER — Other Ambulatory Visit: Payer: Self-pay | Admitting: Family Medicine

## 2020-04-30 ENCOUNTER — Other Ambulatory Visit: Payer: Self-pay | Admitting: Family Medicine

## 2020-05-06 ENCOUNTER — Ambulatory Visit (INDEPENDENT_AMBULATORY_CARE_PROVIDER_SITE_OTHER): Payer: Medicare Other | Admitting: Family Medicine

## 2020-05-06 ENCOUNTER — Other Ambulatory Visit: Payer: Self-pay

## 2020-05-06 ENCOUNTER — Encounter: Payer: Self-pay | Admitting: Family Medicine

## 2020-05-06 VITALS — BP 126/55 | HR 108 | Ht 69.0 in | Wt 254.0 lb

## 2020-05-06 DIAGNOSIS — E1165 Type 2 diabetes mellitus with hyperglycemia: Secondary | ICD-10-CM

## 2020-05-06 DIAGNOSIS — N644 Mastodynia: Secondary | ICD-10-CM

## 2020-05-06 DIAGNOSIS — E118 Type 2 diabetes mellitus with unspecified complications: Secondary | ICD-10-CM | POA: Diagnosis not present

## 2020-05-06 DIAGNOSIS — IMO0002 Reserved for concepts with insufficient information to code with codable children: Secondary | ICD-10-CM

## 2020-05-06 NOTE — Progress Notes (Signed)
Acute Office Visit  Subjective:    Patient ID: Tristan Mayer, male    DOB: Mar 21, 1941, 80 y.o.   MRN: [id number]  Chief Complaint  Patient presents with  . Breast Pain    L breast at nipple x 3 months  . Diabetes    Elevated BS he takes 2 U of Tresiba when he goes golfing and 40 U when he doesn't golf    HPI Patient is in today for left chest wall pain.  He says he has noticed it for about 3 months and finally came in today because it just has not gone away he says the discomfort is mild its not severe it does not keep him from doing anything.  He thought initially maybe he just bumped the area but again it just has not gotten better he points to the nipple area as the area of most discomfort.    He has not noticed any lumps or bumps or masses.  No drainage from the nipple no rashes or cracking of the skin. He wants me to  look at his blood glucose levels.  Normally they run in the low 140s fasting and did up until about mid December and they have been more consistently running between 1 60-200 right around the holidays.  He is currently taking 38 units of Tresiba when he goes golfing and 40 units when he does not play golf.  One was filled yesterday.  He said the pharmacy did not have it ready for him when he went to pick it up.  But he will run out tomorrow.  Past Medical History:  Diagnosis Date  . Atrial fibrillation ([place])   . CVA (cerebral vascular accident) ([place])   . Diabetes mellitus ([place])   . GERD (gastroesophageal reflux disease)   . Hyperlipidemia   . PUD (peptic ulcer disease)     Past Surgical History:  Procedure Laterality Date  . CATARACT EXTRACTION W/ INTRAOCULAR LENS IMPLANT Right     Family History  Problem Relation Age of Onset  . Prostate cancer Father 75  . Stroke Father   . Diabetes Brother   . Diabetes Brother   . Atrial fibrillation Mother     Social History   Socioeconomic History  . Marital status: Married    Spouse name: [name]  . Number of  children: 3  . Years of education: Not on file  . Highest education level: 12th grade  Occupational History  . Occupation: part time driver     Comment: Therapist, occupational and [employer]   Tobacco Use  . Smoking status: Current Some Day Smoker    Types: Cigars  . Smokeless tobacco: Never Used  Substance and Sexual Activity  . Alcohol use: Yes    Comment: Occasional  . Drug use: Not on file  . Sexual activity: Not on file  Other Topics Concern  . Not on file  Social History Narrative  . Not on file   Social Determinants of Health   Financial Resource Strain: Not on file  Food Insecurity: Not on file  Transportation Needs: Not on file  Physical Activity: Not on file  Stress: Not on file  Social Connections: Not on file  Intimate Partner Violence: Not on file    Outpatient Medications Prior to Visit  Medication Sig Dispense Refill  . Blood Glucose Calibration (ACCU-CHEK GUIDE CONTROL) LIQD     . blood glucose meter kit and supplies Dispense based on patient and insurance preference. Use up to  four times daily as directed. Dx: E11.8, E11.65 1 each 0  . Blood Glucose Monitoring Suppl (ONETOUCH VERIO REFLECT) w/Device KIT 1 each by Does not apply route 2 (two) times daily. Dx: E11.8, E11.65 - Check fasting blood sugar every morning and once 2 hours after largest meal of the day. 1 kit prn  . Co-Enzyme Q-10 30 MG CAPS Take 400 mg by mouth daily.    Marland Kitchen diltiazem (CARDIZEM CD) 120 MG 24 hr capsule Take 120 mg by mouth daily.    . famotidine (PEPCID) 20 MG tablet Take 20 mg by mouth daily.    . finasteride (PROSCAR) 5 MG tablet TAKE 1 TABLET BY MOUTH AT BEDTIME 90 tablet 3  . gabapentin (NEURONTIN) 300 MG capsule TAKE 2 CAPSULES BY MOUTH IN THE MORNING AND 3 IN THE EVENING 450 capsule 1  . glipiZIDE (GLUCOTROL) 10 MG tablet TAKE 1 TABLET BY MOUTH TWICE DAILY BEFORE MEAL(S) 180 tablet 1  . glucose blood (ONETOUCH VERIO) test strip Dx: E11.8, E11.65 - Check fasting blood sugar every morning and  once 2 hours after largest meal of the day. 100 each 12  . Insulin Pen Needle (BD ULTRA-FINE PEN NEEDLES) 29G X 12.7MM MISC USE 1  TWICE DAILY WITH PENS 100 each prn  . losartan (COZAAR) 25 MG tablet Take 1 tablet by mouth once daily 90 tablet 1  . Melatonin 10 MG TABS Take 1 tablet by mouth daily.    . metFORMIN (GLUCOPHAGE) 1000 MG tablet TAKE 1 TABLET BY MOUTH TWICE DAILY WITH MEALS 180 tablet 0  . Potassium 99 MG TABS Take 5 tablets by mouth daily.    . rivaroxaban (XARELTO) 20 MG TABS tablet TAKE 1 TABLET BY MOUTH ONCE DAILY WITH SUPPER 90 tablet 3  . rosuvastatin (CRESTOR) 40 MG tablet Take 1 tablet by mouth once daily 90 tablet 0  . sertraline (ZOLOFT) 100 MG tablet Take 1 tablet by mouth once daily 90 tablet 0  . tamsulosin (FLOMAX) 0.4 MG CAPS capsule Take 1 capsule by mouth twice daily 180 capsule 0  . TRESIBA FLEXTOUCH 100 UNIT/ML FlexTouch Pen INJECT 65 TO 80 UNITS SUBCUTANEOUSLY ONCE DAILY 15 mL 2  . Ultra Thin Lancets 31G MISC      No facility-administered medications prior to visit.    Allergies  Allergen Reactions  . Statins Other (See Comments)    Other reaction(s): Other All statin drugs Leg cramps when he was on atorvastatin and fenofibrate simultaneously. Discussed with his PCP Dr. Remus Blake on the phone [phone]) All statin drugs Leg cramps when he was on atorvastatin and fenofibrate simultaneously. Discussed with his PCP Dr. Remus Blake on the phone [phone]) All statin drugs   . Eliquis [Apixaban] Other (See Comments)    Feels weak   . Lisinopril Other (See Comments)    Weakness    Review of Systems     Objective:    Physical Exam Vitals reviewed.  Constitutional:      Appearance: He is well-developed and well-nourished.  HENT:     Head: Normocephalic and atraumatic.  Eyes:     Extraocular Movements: EOM normal.     Conjunctiva/sclera: Conjunctivae normal.  Cardiovascular:     Rate and Rhythm: Normal rate.  Pulmonary:     Effort: Pulmonary  effort is normal.  Chest:     Comments: Left chest/breast area it does look more full on the left compared to the right.  No palpable mass on exam.  He did have some mild tenderness over  the nipple area.  No left axillary lymphadenopathy palpable. Skin:    General: Skin is dry.     Coloration: Skin is not pale.  Neurological:     Mental Status: He is alert and oriented to person, place, and time.  Psychiatric:        Mood and Affect: Mood and affect normal.        Behavior: Behavior normal.     BP (!) 126/55   Pulse (!) 108   Ht 5' 9" (1.753 m)   Wt 254 lb (115.2 kg)   SpO2 98%   BMI 37.51 kg/m  Wt Readings from Last 3 Encounters:  05/06/20 254 lb (115.2 kg)  03/18/20 248 lb (112.5 kg)  12/16/19 251 lb (113.9 kg)    Health Maintenance Due  Topic Date Due  . Hepatitis C Screening  Never done  . COVID-19 Vaccine (2 - Booster for [employer] series) 09/07/2019  . OPHTHALMOLOGY EXAM  04/18/2020    There are no preventive care reminders to display for this patient.   No results found for: TSH Lab Results  Component Value Date   WBC 6.6 09/20/2017   HGB 14.1 09/20/2017   HCT 40.9 09/20/2017   MCV 85.9 09/20/2017   PLT 179 09/20/2017   Lab Results  Component Value Date   NA 135 12/16/2019   K 4.7 12/16/2019   CO2 27 12/16/2019   GLUCOSE 128 (H) 12/16/2019   BUN 22 12/16/2019   CREATININE 1.21 (H) 12/16/2019   BILITOT 1.1 05/15/2019   AST 21 05/15/2019   ALT 18 05/15/2019   PROT 6.8 05/15/2019   CALCIUM 9.2 12/16/2019   Lab Results  Component Value Date   CHOL 121 05/15/2019   Lab Results  Component Value Date   HDL 31 (L) 05/15/2019   Lab Results  Component Value Date   LDLCALC 65 05/15/2019   Lab Results  Component Value Date   TRIG 170 (H) 05/15/2019   Lab Results  Component Value Date   CHOLHDL 3.9 05/15/2019   Lab Results  Component Value Date   HGBA1C 8.4 (A) 03/18/2020       Assessment & Plan:   Problem List Items Addressed This Visit       Endocrine   Uncontrolled diabetes mellitus with complication, without long-term current use of insulin ([facility])    Blood sugars have been significantly elevated over the last 3 weeks.  Discussed going up on his insulin by 2 units for a week and then if he still needs to go to more the following week certainly he can do that.  Also discussed really reviewing his diet I suspect that this is more related to dietary indiscretion right around the holidays.  So really looking at the things that are triggering his blood sugar elevations.  Otherwise I will see him back at his routine follow-up for diabetes which should be in about a month.       Other Visit Diagnoses    Breast pain, left    -  Primary   Relevant Orders   US BREAST COMPLETE UNI LEFT INC AXILLA   MM 3D SCREEN BREAST UNI LEFT       No orders of the defined types were placed in this encounter.    Beatrice Lecher, MD

## 2020-05-06 NOTE — Assessment & Plan Note (Signed)
Blood sugars have been significantly elevated over the last 3 weeks.  Discussed going up on his insulin by 2 units for a week and then if he still needs to go to more the following week certainly he can do that.  Also discussed really reviewing his diet I suspect that this is more related to dietary indiscretion right around the holidays.  So really looking at the things that are triggering his blood sugar elevations.  Otherwise I will see him back at his routine follow-up for diabetes which should be in about a month.

## 2020-05-20 ENCOUNTER — Other Ambulatory Visit: Payer: Self-pay | Admitting: Family Medicine

## 2020-05-30 ENCOUNTER — Other Ambulatory Visit: Payer: Self-pay | Admitting: Family Medicine

## 2020-06-01 ENCOUNTER — Other Ambulatory Visit: Payer: Self-pay | Admitting: Family Medicine

## 2020-06-01 DIAGNOSIS — N644 Mastodynia: Secondary | ICD-10-CM

## 2020-06-15 ENCOUNTER — Other Ambulatory Visit: Payer: Self-pay | Admitting: Family Medicine

## 2020-06-22 ENCOUNTER — Ambulatory Visit (INDEPENDENT_AMBULATORY_CARE_PROVIDER_SITE_OTHER): Payer: Medicare Other | Admitting: Family Medicine

## 2020-06-22 ENCOUNTER — Other Ambulatory Visit: Payer: Self-pay

## 2020-06-22 ENCOUNTER — Encounter: Payer: Self-pay | Admitting: Family Medicine

## 2020-06-22 VITALS — BP 122/55 | HR 59 | Ht 69.0 in | Wt 252.0 lb

## 2020-06-22 DIAGNOSIS — IMO0002 Reserved for concepts with insufficient information to code with codable children: Secondary | ICD-10-CM

## 2020-06-22 DIAGNOSIS — E118 Type 2 diabetes mellitus with unspecified complications: Secondary | ICD-10-CM

## 2020-06-22 DIAGNOSIS — I10 Essential (primary) hypertension: Secondary | ICD-10-CM

## 2020-06-22 DIAGNOSIS — E1165 Type 2 diabetes mellitus with hyperglycemia: Secondary | ICD-10-CM | POA: Diagnosis not present

## 2020-06-22 DIAGNOSIS — I700 Atherosclerosis of aorta: Secondary | ICD-10-CM

## 2020-06-22 DIAGNOSIS — Z6835 Body mass index (BMI) 35.0-35.9, adult: Secondary | ICD-10-CM

## 2020-06-22 LAB — POCT GLYCOSYLATED HEMOGLOBIN (HGB A1C): Hemoglobin A1C: 8 % — AB (ref 4.0–5.6)

## 2020-06-22 LAB — LIPID PANEL
Cholesterol: 111 mg/dL (ref <200)
HDL: 30 mg/dL — ABNORMAL LOW (ref >=40)
LDL Cholesterol (Calc): 53 mg/dL (calc)
Non-HDL Cholesterol (Calc): 81 mg/dL (calc) (ref <130)
Total CHOL/HDL Ratio: 3.7 (calc) (ref <5.0)
Triglycerides: 213 mg/dL — ABNORMAL HIGH (ref <150)

## 2020-06-22 LAB — COMPLETE METABOLIC PANEL WITH GFR
AG Ratio: 1.8 (calc) (ref 1.0–2.5)
ALT: 20 U/L (ref 9–46)
AST: 15 U/L (ref 10–35)
Albumin: 4.2 g/dL (ref 3.6–5.1)
Alkaline phosphatase (APISO): 56 U/L (ref 35–144)
BUN/Creatinine Ratio: 17 (calc) (ref 6–22)
BUN: 27 mg/dL — ABNORMAL HIGH (ref 7–25)
CO2: 25 mmol/L (ref 20–32)
Calcium: 9.5 mg/dL (ref 8.6–10.3)
Chloride: 106 mmol/L (ref 98–110)
Creat: 1.56 mg/dL — ABNORMAL HIGH (ref 0.70–1.18)
GFR, Est African American: 49 mL/min/{1.73_m2} — ABNORMAL LOW (ref >=60)
GFR, Est Non African American: 42 mL/min/{1.73_m2} — ABNORMAL LOW (ref >=60)
Globulin: 2.4 g/dL (calc) (ref 1.9–3.7)
Glucose, Bld: 162 mg/dL — ABNORMAL HIGH (ref 65–99)
Potassium: 4.7 mmol/L (ref 3.5–5.3)
Sodium: 138 mmol/L (ref 135–146)
Total Bilirubin: 0.9 mg/dL (ref 0.2–1.2)
Total Protein: 6.6 g/dL (ref 6.1–8.1)

## 2020-06-22 LAB — CBC
HCT: 39.3 % (ref 38.5–50.0)
Hemoglobin: 13.5 g/dL (ref 13.2–17.1)
MCH: 29.9 pg (ref 27.0–33.0)
MCHC: 34.4 g/dL (ref 32.0–36.0)
MCV: 86.9 fL (ref 80.0–100.0)
MPV: 12.4 fL (ref 7.5–12.5)
Platelets: 190 10*3/uL (ref 140–400)
RBC: 4.52 10*6/uL (ref 4.20–5.80)
RDW: 12.5 % (ref 11.0–15.0)
WBC: 7.8 10*3/uL (ref 3.8–10.8)

## 2020-06-22 NOTE — Assessment & Plan Note (Signed)
BMI of 37 today with comorbidities.  Encouraged him to continue to work on weight loss he is actually down about 2 pounds which is great just encouraged him to continue to work on it.  Follow-up in 3 months.

## 2020-06-22 NOTE — Assessment & Plan Note (Signed)
A1c is still uncontrolled but improved down to 8.0 from 8.4.  Just encouraged him to continue to work on cutting back on carbs and staying more active.  He says he has been walking a little bit more when he plays golf which she does 3 days/week.  He uses 38 units of Tresiba the night before he plays golf and 48 all other days.

## 2020-06-22 NOTE — Assessment & Plan Note (Signed)
BP up a little today.  Will follow. Usually much better.

## 2020-06-22 NOTE — Progress Notes (Signed)
Established Patient Office Visit  Subjective:  Patient ID: Tristan Mayer, male    DOB: 1940-07-22  Age: 80 y.o. MRN: [id number]  CC:  Chief Complaint  Patient presents with  . Diabetes    Takes 38 u on T, Th, F Takes 48 u all other days  . Hypertension    HPI [name] presents for   Diabetes - no hypoglycemic events. No wounds or sores that are not healing well. No increased thirst or urination. Checking glucose at home. Taking medications as prescribed without any side effects.  Hypertension- Pt denies chest pain, SOB, dizziness, or heart palpitations.  Taking meds as directed w/o problems.  Denies medication side effects.      Past Medical History:  Diagnosis Date  . Atrial fibrillation ([place])   . CVA (cerebral vascular accident) ([place])   . Diabetes mellitus ([place])   . GERD (gastroesophageal reflux disease)   . Hyperlipidemia   . PUD (peptic ulcer disease)     Past Surgical History:  Procedure Laterality Date  . CATARACT EXTRACTION W/ INTRAOCULAR LENS IMPLANT Right     Family History  Problem Relation Age of Onset  . Prostate cancer Father 5  . Stroke Father   . Diabetes Brother   . Diabetes Brother   . Atrial fibrillation Mother     Social History   Socioeconomic History  . Marital status: Married    Spouse name: [name]  . Number of children: 3  . Years of education: Not on file  . Highest education level: 12th grade  Occupational History  . Occupation: part time driver     Comment: Therapist, occupational and [employer]   Tobacco Use  . Smoking status: Current Some Day Smoker    Types: Cigars  . Smokeless tobacco: Never Used  Substance and Sexual Activity  . Alcohol use: Yes    Comment: Occasional  . Drug use: Not on file  . Sexual activity: Not on file  Other Topics Concern  . Not on file  Social History Narrative  . Not on file   Social Determinants of Health   Financial Resource Strain: Not on file  Food Insecurity: Not on file  Transportation Needs:  Not on file  Physical Activity: Not on file  Stress: Not on file  Social Connections: Not on file  Intimate Partner Violence: Not on file    Outpatient Medications Prior to Visit  Medication Sig Dispense Refill  . Blood Glucose Calibration (ACCU-CHEK GUIDE CONTROL) LIQD     . blood glucose meter kit and supplies Dispense based on patient and insurance preference. Use up to four times daily as directed. Dx: E11.8, E11.65 1 each 0  . Blood Glucose Monitoring Suppl (ONETOUCH VERIO REFLECT) w/Device KIT 1 each by Does not apply route 2 (two) times daily. Dx: E11.8, E11.65 - Check fasting blood sugar every morning and once 2 hours after largest meal of the day. 1 kit prn  . Co-Enzyme Q-10 30 MG CAPS Take 400 mg by mouth daily.    Marland Kitchen diltiazem (CARDIZEM CD) 120 MG 24 hr capsule Take 1 capsule by mouth once daily 90 capsule 0  . famotidine (PEPCID) 20 MG tablet Take 20 mg by mouth daily.    . finasteride (PROSCAR) 5 MG tablet TAKE 1 TABLET BY MOUTH AT BEDTIME 90 tablet 3  . gabapentin (NEURONTIN) 300 MG capsule TAKE 2 CAPSULES BY MOUTH IN THE MORNING AND 3 IN THE EVENING 450 capsule 1  . glipiZIDE (GLUCOTROL) 10  MG tablet TAKE 1 TABLET BY MOUTH TWICE DAILY BEFORE MEAL(S) 180 tablet 0  . glucose blood (ONETOUCH VERIO) test strip Dx: E11.8, E11.65 - Check fasting blood sugar every morning and once 2 hours after largest meal of the day. 100 each 12  . Insulin Pen Needle (BD ULTRA-FINE PEN NEEDLES) 29G X 12.7MM MISC USE 1  TWICE DAILY WITH PENS 100 each prn  . losartan (COZAAR) 25 MG tablet Take 1 tablet by mouth once daily 90 tablet 1  . Melatonin 10 MG TABS Take 1 tablet by mouth daily.    . metFORMIN (GLUCOPHAGE) 1000 MG tablet TAKE 1 TABLET BY MOUTH TWICE DAILY WITH MEALS 180 tablet 0  . Potassium 99 MG TABS Take 5 tablets by mouth daily.    . rivaroxaban (XARELTO) 20 MG TABS tablet TAKE 1 TABLET BY MOUTH ONCE DAILY WITH SUPPER 90 tablet 3  . rosuvastatin (CRESTOR) 40 MG tablet Take 1 tablet by  mouth once daily 90 tablet 0  . sertraline (ZOLOFT) 100 MG tablet Take 1 tablet by mouth once daily 90 tablet 0  . tamsulosin (FLOMAX) 0.4 MG CAPS capsule Take 1 capsule by mouth twice daily 180 capsule 0  . TRESIBA FLEXTOUCH 100 UNIT/ML FlexTouch Pen INJECT 65 TO 80 UNITS SUBCUTANEOUSLY ONCE DAILY 15 mL 2  . Ultra Thin Lancets 31G MISC      No facility-administered medications prior to visit.    Allergies  Allergen Reactions  . Statins Other (See Comments)    Other reaction(s): Other All statin drugs Leg cramps when he was on atorvastatin and fenofibrate simultaneously. Discussed with his PCP Dr. Remus Blake on the phone [phone]) All statin drugs Leg cramps when he was on atorvastatin and fenofibrate simultaneously. Discussed with his PCP Dr. Remus Blake on the phone [phone]) All statin drugs   . Eliquis [Apixaban] Other (See Comments)    Feels weak   . Lisinopril Other (See Comments)    Weakness    ROS Review of Systems    Objective:    Physical Exam Constitutional:      Appearance: He is well-developed and well-nourished.  HENT:     Head: Normocephalic and atraumatic.  Cardiovascular:     Rate and Rhythm: Normal rate and regular rhythm.     Heart sounds: Normal heart sounds.  Pulmonary:     Effort: Pulmonary effort is normal.     Breath sounds: Normal breath sounds.  Skin:    General: Skin is warm and dry.  Neurological:     Mental Status: He is alert and oriented to person, place, and time.  Psychiatric:        Mood and Affect: Mood and affect normal.        Behavior: Behavior normal.     BP (!) 122/55   Pulse (!) 59   Ht 5' 9"  (1.753 m)   Wt 252 lb (114.3 kg)   SpO2 96%   BMI 37.21 kg/m  Wt Readings from Last 3 Encounters:  06/22/20 252 lb (114.3 kg)  05/06/20 254 lb (115.2 kg)  03/18/20 248 lb (112.5 kg)     Health Maintenance Due  Topic Date Due  . Hepatitis C Screening  Never done  . TETANUS/TDAP  05/03/2019  . COVID-19 Vaccine (2 -  Booster for [employer] series) 09/07/2019  . OPHTHALMOLOGY EXAM  04/18/2020    There are no preventive care reminders to display for this patient.  No results found for: TSH Lab Results  Component Value Date  WBC 6.6 09/20/2017   HGB 14.1 09/20/2017   HCT 40.9 09/20/2017   MCV 85.9 09/20/2017   PLT 179 09/20/2017   Lab Results  Component Value Date   NA 135 12/16/2019   K 4.7 12/16/2019   CO2 27 12/16/2019   GLUCOSE 128 (H) 12/16/2019   BUN 22 12/16/2019   CREATININE 1.21 (H) 12/16/2019   BILITOT 1.1 05/15/2019   AST 21 05/15/2019   ALT 18 05/15/2019   PROT 6.8 05/15/2019   CALCIUM 9.2 12/16/2019   Lab Results  Component Value Date   CHOL 121 05/15/2019   Lab Results  Component Value Date   HDL 31 (L) 05/15/2019   Lab Results  Component Value Date   LDLCALC 65 05/15/2019   Lab Results  Component Value Date   TRIG 170 (H) 05/15/2019   Lab Results  Component Value Date   CHOLHDL 3.9 05/15/2019   Lab Results  Component Value Date   HGBA1C 8.0 (A) 06/22/2020      Assessment & Plan:   Problem List Items Addressed This Visit      Cardiovascular and Mediastinum   Benign essential hypertension    BP up a little today.  Will follow. Usually much better.       Relevant Orders   CBC   COMPLETE METABOLIC PANEL WITH GFR   Lipid panel   Aortic atherosclerosis (HCC)   Relevant Orders   CBC   COMPLETE METABOLIC PANEL WITH GFR   Lipid panel     Endocrine   Uncontrolled diabetes mellitus with complication, without long-term current use of insulin (HCC) - Primary    A1c is still uncontrolled but improved down to 8.0 from 8.4.  Just encouraged him to continue to work on cutting back on carbs and staying more active.  He says he has been walking a little bit more when he plays golf which she does 3 days/week.  He uses 38 units of Tresiba the night before he plays golf and 48 all other days.      Relevant Orders   POCT glycosylated hemoglobin (Hb A1C)  (Completed)   CBC   COMPLETE METABOLIC PANEL WITH GFR   Lipid panel     Other   Severe obesity (BMI 35.0-35.9 with comorbidity) (HCC)   Morbid obesity (HCC)    BMI of 37 today with comorbidities.  Encouraged him to continue to work on weight loss he is actually down about 2 pounds which is great just encouraged him to continue to work on it.  Follow-up in 3 months.         No orders of the defined types were placed in this encounter.   Follow-up: Return in about 3 months (around 09/19/2020) for Diabetes follow-up.    Beatrice Lecher, MD

## 2020-06-23 ENCOUNTER — Ambulatory Visit (INDEPENDENT_AMBULATORY_CARE_PROVIDER_SITE_OTHER): Payer: Medicare Other | Admitting: Sports Medicine

## 2020-06-23 ENCOUNTER — Ambulatory Visit (INDEPENDENT_AMBULATORY_CARE_PROVIDER_SITE_OTHER): Payer: Medicare Other

## 2020-06-23 DIAGNOSIS — M1712 Unilateral primary osteoarthritis, left knee: Secondary | ICD-10-CM | POA: Diagnosis not present

## 2020-06-23 DIAGNOSIS — M2548 Effusion, other site: Secondary | ICD-10-CM | POA: Diagnosis not present

## 2020-06-23 DIAGNOSIS — M1711 Unilateral primary osteoarthritis, right knee: Secondary | ICD-10-CM | POA: Diagnosis not present

## 2020-06-23 DIAGNOSIS — M170 Bilateral primary osteoarthritis of knee: Secondary | ICD-10-CM | POA: Diagnosis not present

## 2020-06-23 DIAGNOSIS — M25461 Effusion, right knee: Secondary | ICD-10-CM | POA: Diagnosis not present

## 2020-06-23 DIAGNOSIS — M25462 Effusion, left knee: Secondary | ICD-10-CM

## 2020-06-23 MED ORDER — ACETAMINOPHEN ER 650 MG PO TBCR: 650.0000 mg | EXTENDED_RELEASE_TABLET | Freq: Three times a day (TID) | ORAL | 3 refills | Status: DC | PRN

## 2020-06-23 MED ORDER — TRAMADOL HCL 50 MG PO TABS: 50.0000 mg | ORAL_TABLET | Freq: Three times a day (TID) | ORAL | 0 refills | Status: DC | PRN

## 2020-06-23 MED ORDER — GELSYN-3 16.8 MG/2ML IX SOSY: 1.0000 | PREFILLED_SYRINGE | INTRA_ARTICULAR | 3 refills | Status: DC

## 2020-06-23 NOTE — Assessment & Plan Note (Signed)
Tristan Mayer is a pleasant 80 year old male with bilateral knee osteoarthritis, right worse than left. We injected him about 3 years ago, he tells me he got a single day of relief, he went to an outside practice and had a Genvisc 850 injection series. He would like to do this again but unfortunately this is not covered by his insurance company. It looks like [employer] is preferred agent for [facility] Medicare so we will send off an order for Synvisc to his specialty pharmacy for both knees. I am going with some updated x-rays today, add arthritis strength Tylenol, tramadol for breakthrough pain and days he plays golf. He can return to see me to start the injection series once we get the delivery.

## 2020-06-23 NOTE — Progress Notes (Signed)
    Procedures performed today:    None.  Independent interpretation of notes and tests performed by another provider:   None.  Brief History, Exam, Impression, and Recommendations:    Primary osteoarthritis of both knees Marya Amsler is a pleasant 80 year old male with bilateral knee osteoarthritis, right worse than left. We injected him about 3 years ago, he tells me he got a single day of relief, he went to an outside practice and had a Genvisc 850 injection series. He would like to do this again but unfortunately this is not covered by his insurance company. It looks like [employer] is preferred agent for [facility] Medicare so we will send off an order for Synvisc to his specialty pharmacy for both knees. I am going with some updated x-rays today, add arthritis strength Tylenol, tramadol for breakthrough pain and days he plays golf. He can return to see me to start the injection series once we get the delivery.    ___________________________________________ Gwen Her. Dianah Field, M.D., ABFM., CAQSM. Primary Care and [place] [employer] of [employer] [facility]

## 2020-07-07 ENCOUNTER — Other Ambulatory Visit: Payer: Self-pay | Admitting: Family Medicine

## 2020-07-09 ENCOUNTER — Encounter: Payer: Self-pay | Admitting: Family Medicine

## 2020-07-23 ENCOUNTER — Ambulatory Visit: Payer: Medicare Other | Admitting: Sports Medicine

## 2020-07-27 ENCOUNTER — Ambulatory Visit (INDEPENDENT_AMBULATORY_CARE_PROVIDER_SITE_OTHER): Payer: Medicare Other | Admitting: Sports Medicine

## 2020-07-27 DIAGNOSIS — Z0000 Encounter for general adult medical examination without abnormal findings: Secondary | ICD-10-CM | POA: Diagnosis not present

## 2020-07-27 NOTE — Progress Notes (Signed)
MEDICARE ANNUAL WELLNESS VISIT  07/27/2020  Telephone Visit Disclaimer This Medicare AWV was conducted by telephone due to national recommendations for restrictions regarding the COVID-19 Pandemic (e.g. social distancing).  I verified, using two identifiers, that I am speaking with Tristan Mayer or their authorized healthcare agent. I discussed the limitations, risks, security, and privacy concerns of performing an evaluation and management service by telephone and the potential availability of an in-person appointment in the future. The patient expressed understanding and agreed to proceed.  Location of Patient: Home Location of Provider (nurse):  In the office.  Subjective:    Tristan Mayer is a 80 y.o. male patient of Metheney, Rene Kocher, MD who had a Medicare Annual Wellness Visit today via telephone. Draiden is Retired and lives with their spouse. he has 3 children. he reports that he is socially active and does interact with friends/family regularly. he is minimally physically active and enjoys playing golf three times a week.  Patient Care Team: Hali Marry, MD as PCP - General (Family Medicine)  Advanced Directives 07/27/2020 01/08/2018 07/03/2017  Does Patient Have a Medical Advance Directive? Yes Yes Yes  Type of Paramedic of [place];Living will [place];Living will [place];Living will  Does patient want to make changes to medical advance directive? No - Patient declined - No - Patient declined  Copy of [place] in Chart? No - copy requested Yes No - copy requested    Hospital Utilization Over the Past 12 Months: # of hospitalizations or ER visits: 0 # of surgeries: 0  Review of Systems    Patient reports that his overall health is unchanged compared to last year.  History obtained from chart review and the patient  Patient Reported Readings (BP, Pulse, CBG, Weight,  etc) none  Pain Assessment Pain : No/denies pain     Current Medications & Allergies (verified) Allergies as of 07/27/2020      Reactions   Statins Other (See Comments)   Other reaction(s): Other All statin drugs Leg cramps when he was on atorvastatin and fenofibrate simultaneously. Discussed with his PCP Dr. Remus Blake on the phone [phone]) All statin drugs Leg cramps when he was on atorvastatin and fenofibrate simultaneously. Discussed with his PCP Dr. Remus Blake on the phone [phone]) All statin drugs   Eliquis [apixaban] Other (See Comments)   Feels weak    Lisinopril Other (See Comments)   Weakness      Medication List       Accurate as of July 27, 2020  9:30 AM. If you have any questions, ask your nurse or doctor.        Accu-Chek Guide Control Liqd   acetaminophen 650 MG CR tablet Commonly known as: TYLENOL Take 1 tablet (650 mg total) by mouth every 8 (eight) hours as needed for pain.   BD ULTRA-FINE PEN NEEDLES 29G X 12.7MM Misc Generic drug: Insulin Pen Needle USE 1  TWICE DAILY WITH PENS   blood glucose meter kit and supplies Dispense based on patient and insurance preference. Use up to four times daily as directed. Dx: E11.8, E11.65   Co-Enzyme Q-10 30 MG Caps Take 400 mg by mouth daily.   diltiazem 120 MG 24 hr capsule Commonly known as: CARDIZEM CD Take 1 capsule by mouth once daily   famotidine 20 MG tablet Commonly known as: PEPCID Take 20 mg by mouth daily.   finasteride 5 MG tablet Commonly known as: PROSCAR TAKE  1 TABLET BY MOUTH AT BEDTIME   gabapentin 300 MG capsule Commonly known as: NEURONTIN TAKE 2 CAPSULES BY MOUTH IN THE MORNING AND 3 IN THE EVENING   Gelsyn-3 16.8 MG/2ML Sosy Generic drug: Sodium Hyaluronate (Viscosup) Inject 16.8 mg into the articular space once a week. Injected into the knee weekly x3, dispense #6 syringes (bilateral injections)   glipiZIDE 10 MG tablet Commonly known as: GLUCOTROL TAKE 1 TABLET BY  MOUTH TWICE DAILY BEFORE MEAL(S)   losartan 25 MG tablet Commonly known as: COZAAR Take 1 tablet by mouth once daily   Melatonin 10 MG Tabs Take 1 tablet by mouth daily. As needed.   metFORMIN 1000 MG tablet Commonly known as: GLUCOPHAGE TAKE 1 TABLET BY MOUTH TWICE DAILY WITH MEALS   OneTouch Verio Reflect w/Device Kit 1 each by Does not apply route 2 (two) times daily. Dx: E11.8, E11.65 - Check fasting blood sugar every morning and once 2 hours after largest meal of the day.   OneTouch Verio test strip Generic drug: glucose blood Dx: E11.8, E11.65 - Check fasting blood sugar every morning and once 2 hours after largest meal of the day.   Potassium 99 MG Tabs Take 5 tablets by mouth daily. Takes it As needed.   rivaroxaban 20 MG Tabs tablet Commonly known as: Xarelto TAKE 1 TABLET BY MOUTH ONCE DAILY WITH SUPPER   rosuvastatin 40 MG tablet Commonly known as: CRESTOR Take 1 tablet by mouth once daily   sertraline 100 MG tablet Commonly known as: ZOLOFT Take 1 tablet by mouth once daily   tamsulosin 0.4 MG Caps capsule Commonly known as: FLOMAX Take 1 capsule by mouth twice daily   traMADol 50 MG tablet Commonly known as: ULTRAM Take 1-2 tablets (50-100 mg total) by mouth every 8 (eight) hours as needed for moderate pain. Maximum 6 tabs per day.   [name] FlexTouch 100 UNIT/ML FlexTouch Pen Generic drug: insulin degludec INJECT 65 TO 80 UNITS SUBCUTANEOUSLY ONCE DAILY   Ultra Thin Lancets 31G Misc       History (reviewed): Past Medical History:  Diagnosis Date  . Atrial fibrillation ([place])   . CVA (cerebral vascular accident) ([place])   . Diabetes mellitus ([place])   . GERD (gastroesophageal reflux disease)   . Hyperlipidemia   . PUD (peptic ulcer disease)    Past Surgical History:  Procedure Laterality Date  . CATARACT EXTRACTION W/ INTRAOCULAR LENS IMPLANT Right    Family History  Problem Relation Age of Onset  . Prostate cancer Father 23  . Stroke  Father   . Diabetes Brother   . Diabetes Brother   . Atrial fibrillation Mother    Social History   Socioeconomic History  . Marital status: Married    Spouse name: [name]  . Number of children: 3  . Years of education: 12th Grade  . Highest education level: 12th grade  Occupational History  . Occupation: Retired  Tobacco Use  . Smoking status: Current Some Day Smoker    Types: Cigars  . Smokeless tobacco: Never Used  Substance and Sexual Activity  . Alcohol use: Yes    Comment: Occasional  . Drug use: Never  . Sexual activity: Not on file  Other Topics Concern  . Not on file  Social History Narrative   Lives with his wife. He enjoys playing golf three times a week.   Social Determinants of Health   Financial Resource Strain: Low Risk   . Difficulty of Paying Living Expenses: Not hard  at all  Food Insecurity: No Food Insecurity  . Worried About Charity fundraiser in the Last Year: Never true  . Ran Out of Food in the Last Year: Never true  Transportation Needs: No Transportation Needs  . Lack of Transportation (Medical): No  . Lack of Transportation (Non-Medical): No  Physical Activity: Inactive  . Days of Exercise per Week: 0 days  . Minutes of Exercise per Session: 0 min  Stress: No Stress Concern Present  . Feeling of Stress : Not at all  Social Connections: Moderately Isolated  . Frequency of Communication with Friends and Family: Once a week  . Frequency of Social Gatherings with Friends and Family: Never  . Attends Religious Services: More than 4 times per year  . Active Member of Clubs or Organizations: No  . Attends Archivist Meetings: Never  . Marital Status: Married    Activities of Daily Living In your present state of health, do you have any difficulty performing the following activities: 07/27/2020  Hearing? N  Comment new hearing aids in both ears.  Vision? N  Difficulty concentrating or making decisions? N  Walking or climbing stairs?  Y  Comment just moves slow to avoid a fall  Dressing or bathing? N  Doing errands, shopping? N  Preparing Food and eating ? N  Using the Toilet? N  In the past six months, have you accidently leaked urine? N  Do you have problems with loss of bowel control? N  Managing your Medications? N  Managing your Finances? N  Housekeeping or managing your Housekeeping? N  Some recent data might be hidden    Patient Education/ Literacy How often do you need to have someone help you when you read instructions, pamphlets, or other written materials from your doctor or pharmacy?: 1 - Never What is the last grade level you completed in school?: 12th grade  Exercise Current Exercise Habits: The patient does not participate in regular exercise at present, Exercise limited by: None identified  Diet Patient reports consuming 3 meals a day and 1 snack(s) a day Patient reports that his primary diet is: Regular Patient reports that she does have regular access to food.   Depression Screen PHQ 2/9 Scores 07/27/2020 06/22/2020 03/18/2020 02/25/2019 08/22/2018 02/16/2018 08/14/2017  PHQ - 2 Score 0 0 0 0 0 0 0  PHQ- 9 Score - - - - - - -     Fall Risk Fall Risk  07/27/2020 06/22/2020 02/25/2019 08/22/2018 03/02/2018  Falls in the past year? 0 0 1 0 1  Number falls in past yr: 0 - 0 1 1  Injury with Fall? 0 - _0 Risk for fall due to : No Fall Risks No Fall Risks Other (Comment) History of fall(s) History of fall(s)  Follow up Falls evaluation completed - - Falls prevention discussed;Education provided Falls prevention discussed     Objective:  Tristan Mayer seemed alert and oriented and he participated appropriately during our telephone visit.  Blood Pressure Weight BMI  BP Readings from Last 3 Encounters:  06/22/20 (!) 122/55  05/06/20 (!) 126/55  03/18/20 136/69   Wt Readings from Last 3 Encounters:  06/22/20 252 lb (114.3 kg)  05/06/20 254 lb (115.2 kg)  03/18/20 248 lb (112.5 kg)   BMI  Readings from Last 1 Encounters:  06/22/20 37.21 kg/m    *Unable to obtain current vital signs, weight, and BMI due to telephone visit type  Hearing/Vision  . Broadus John  did not seem to have difficulty with hearing/understanding during the telephone conversation . Reports that he has had a formal eye exam by an eye care professional within the past year . Reports that he has had a formal hearing evaluation within the past year *Unable to fully assess hearing and vision during telephone visit type  Cognitive Function: 6CIT Screen 07/27/2020  What Year? 0 points  What month? 0 points  What time? 0 points  Count back from 20 0 points  Months in reverse 0 points  Repeat phrase 0 points  Total Score 0   (Normal:0-7, Significant for Dysfunction: >8)  Normal Cognitive Function Screening: Yes   Immunization & Health Maintenance Record Immunization History  Administered Date(s) Administered  . Fluad Quad(high Dose 65+) 02/25/2019  . Influenza, High Dose Seasonal PF 02/18/2015, 02/06/2017, 01/12/2018, 03/03/2020  . Influenza, Seasonal, Injecte, Preservative Fre 02/08/2016  . Influenza-Unspecified 02/08/2016, 03/02/2017  . Janssen (J&J) SARS-COV-2 Vaccination 07/13/2019, 05/18/2020  . PPD Test 03/02/2017  . Pneumococcal Conjugate-13 01/19/2016  . Pneumococcal Polysaccharide-23 09/30/2000, 02/28/2013  . Tdap 05/02/2009    Health Maintenance  Topic Date Due  . TETANUS/TDAP  07/27/2021 (Originally 05/03/2019)  . Hepatitis C Screening  07/27/2021 (Originally 1941-04-25)  . HEMOGLOBIN A1C  12/20/2020  . FOOT EXAM  03/18/2021  . OPHTHALMOLOGY EXAM  04/30/2021  . INFLUENZA VACCINE  Completed  . COVID-19 Vaccine  Completed  . PNA vac Low Risk Adult  Completed  . HPV VACCINES  Aged Out       Assessment  This is a routine wellness examination for Tristan Mayer.  Health Maintenance: Due or Overdue There are no preventive care reminders to display for this patient.  Tristan Mayer does not  need a referral for Community Assistance: Care Management:   no Social Work:    no Prescription Assistance:  no Nutrition/Diabetes Education:  no   Plan:  Personalized Goals Goals Addressed              This Visit's Progress   .  Patient Stated (pt-stated)        07/27/2020 AWV Goal: Diabetes Management  . Patient will maintain an A1C level below 8.0 . Patient will not develop any diabetic foot complications . Patient will not experience any hypoglycemic episodes over the next 3 months . Patient will notify our office of any CBG readings outside of the provider recommended range by calling [phone] . Patient will adhere to provider recommendations for diabetes management  Patient Self Management Activities . take all medications as prescribed and report any negative side effects . monitor and record blood sugar readings as directed . adhere to a low carbohydrate diet that incorporates lean proteins, vegetables, whole grains, low glycemic fruits . check feet daily noting any sores, cracks, injuries, or callous formations . see PCP or podiatrist if he notices any changes in his legs, feet, or toenails . Patient will visit PCP and have an A1C level checked every 3 to 6 months as directed  . have a yearly eye exam to monitor for vascular changes associated with diabetes and will request that the report be sent to his pcp.  . consult with his PCP regarding any changes in his health or new or worsening symptoms       Personalized Health Maintenance & Screening Recommendations  Td vaccine Shingles Vaccine - patient declined at this time.  Lung Cancer Screening Recommended: no (Low Dose CT Chest recommended if Age 24-80 years, 30 pack-year currently smoking OR have  quit w/in past 15 years) Hepatitis C Screening recommended: yes HIV Screening recommended: yes  Advanced Directives: Written information was not prepared per patient's request.  Referrals & Orders No orders of  the defined types were placed in this encounter.   Follow-up Plan . Follow-up with Hali Marry, MD as planned . Schedule your tetanus shot at your pharmacy.  . Medicare wellness visit in one year.   I have personally reviewed and noted the following in the patient's chart:   . Medical and social history . Use of alcohol, tobacco or illicit drugs  . Current medications and supplements . Functional ability and status . Nutritional status . Physical activity . Advanced directives . List of other physicians . Hospitalizations, surgeries, and ER visits in previous 12 months . Vitals . Screenings to include cognitive, depression, and falls . Referrals and appointments  In addition, I have reviewed and discussed with Tristan Mayer certain preventive protocols, quality metrics, and best practice recommendations. A written personalized care plan for preventive services as well as general preventive health recommendations is available and can be mailed to the patient at his request.      Tinnie Gens, RN  07/27/2020

## 2020-07-27 NOTE — Patient Instructions (Addendum)
[place] Maintenance Summary and Written Plan of Care  [name] ,  Thank you for allowing me to perform your Medicare Annual Wellness Visit and for your ongoing commitment to your health.   Health Maintenance & Immunization History Health Maintenance  Topic Date Due  . TETANUS/TDAP  07/27/2021 (Originally 05/03/2019)  . Hepatitis C Screening  07/27/2021 (Originally 30-May-1940)  . HEMOGLOBIN A1C  12/20/2020  . FOOT EXAM  03/18/2021  . OPHTHALMOLOGY EXAM  04/30/2021  . INFLUENZA VACCINE  Completed  . COVID-19 Vaccine  Completed  . PNA vac Low Risk Adult  Completed  . HPV VACCINES  Aged Out   Immunization History  Administered Date(s) Administered  . Fluad Quad(high Dose 65+) 02/25/2019  . Influenza, High Dose Seasonal PF 02/18/2015, 02/06/2017, 01/12/2018, 03/03/2020  . Influenza, Seasonal, Injecte, Preservative Fre 02/08/2016  . Influenza-Unspecified 02/08/2016, 03/02/2017  . Janssen (J&J) SARS-COV-2 Vaccination 07/13/2019, 05/18/2020  . PPD Test 03/02/2017  . Pneumococcal Conjugate-13 01/19/2016  . Pneumococcal Polysaccharide-23 09/30/2000, 02/28/2013  . Tdap 05/02/2009    These are the patient goals that we discussed: Goals Addressed              This Visit's Progress   .  Patient Stated (pt-stated)        07/27/2020 AWV Goal: Diabetes Management  . Patient will maintain an A1C level below 8.0 . Patient will not develop any diabetic foot complications . Patient will not experience any hypoglycemic episodes over the next 3 months . Patient will notify our office of any CBG readings outside of the provider recommended range by calling [phone] . Patient will adhere to provider recommendations for diabetes management  Patient Self Management Activities . take all medications as prescribed and report any negative side effects . monitor and record blood sugar readings as directed . adhere to a low carbohydrate diet that incorporates  lean proteins, vegetables, whole grains, low glycemic fruits . check feet daily noting any sores, cracks, injuries, or callous formations . see PCP or podiatrist if he notices any changes in his legs, feet, or toenails . Patient will visit PCP and have an A1C level checked every 3 to 6 months as directed  . have a yearly eye exam to monitor for vascular changes associated with diabetes and will request that the report be sent to his pcp.  . consult with his PCP regarding any changes in his health or new or worsening symptoms         This is a list of Health Maintenance Items that are overdue or due now: Td vaccine Shingles Vaccine  Orders/Referrals Placed Today: No orders of the defined types were placed in this encounter.  (Contact our referral department at [phone] if you have not spoken with someone about your referral appointment within the next 5 days)    Follow-up Plan . Follow-up with Hali Marry, MD as planned . Schedule your tetanus shot at your pharmacy.  . Medicare wellness visit in one year.   Diabetes Mellitus and Foot Care Foot care is an important part of your health, especially when you have diabetes. Diabetes may cause you to have problems because of poor blood flow (circulation) to your feet and legs, which can cause your skin to:  Become thinner and drier.  Break more easily.  Heal more slowly.  Peel and crack. You may also have nerve damage (neuropathy) in your legs and feet, causing decreased feeling in them. This means that you may not  notice minor injuries to your feet that could lead to more serious problems. Noticing and addressing any potential problems early is the best way to prevent future foot problems. How to care for your feet Foot hygiene  Wash your feet daily with warm water and mild soap. Do not use hot water. Then, pat your feet and the areas between your toes until they are completely dry. Do not soak your feet as this can  dry your skin.  Trim your toenails straight across. Do not dig under them or around the cuticle. File the edges of your nails with an emery board or nail file.  Apply a moisturizing lotion or petroleum jelly to the skin on your feet and to dry, brittle toenails. Use lotion that does not contain alcohol and is unscented. Do not apply lotion between your toes.   Shoes and socks  Wear clean socks or stockings every day. Make sure they are not too tight. Do not wear knee-high stockings since they may decrease blood flow to your legs.  Wear shoes that fit properly and have enough cushioning. Always look in your shoes before you put them on to be sure there are no objects inside.  To break in new shoes, wear them for just a few hours a day. This prevents injuries on your feet. Wounds, scrapes, corns, and calluses  Check your feet daily for blisters, cuts, bruises, sores, and redness. If you cannot see the bottom of your feet, use a mirror or ask someone for help.  Do not cut corns or calluses or try to remove them with medicine.  If you find a minor scrape, cut, or break in the skin on your feet, keep it and the skin around it clean and dry. You may clean these areas with mild soap and water. Do not clean the area with peroxide, alcohol, or iodine.  If you have a wound, scrape, corn, or callus on your foot, look at it several times a day to make sure it is healing and not infected. Check for: ? Redness, swelling, or pain. ? Fluid or blood. ? Warmth. ? Pus or a bad smell.   General tips  Do not cross your legs. This may decrease blood flow to your feet.  Do not use heating pads or hot water bottles on your feet. They may burn your skin. If you have lost feeling in your feet or legs, you may not know this is happening until it is too late.  Protect your feet from hot and cold by wearing shoes, such as at the beach or on hot pavement.  Schedule a complete foot exam at least once a year  (annually) or more often if you have foot problems. Report any cuts, sores, or bruises to your health care provider immediately. Where to find more information  American Diabetes Association: www.diabetes.org  Association of Diabetes Care & Education Specialists: www.diabeteseducator.org Contact a health care provider if:  You have a medical condition that increases your risk of infection and you have any cuts, sores, or bruises on your feet.  You have an injury that is not healing.  You have redness on your legs or feet.  You feel burning or tingling in your legs or feet.  You have pain or cramps in your legs and feet.  Your legs or feet are numb.  Your feet always feel cold.  You have pain around any toenails. Get help right away if:  You have a wound, scrape, corn,  or callus on your foot and: ? You have pain, swelling, or redness that gets worse. ? You have fluid or blood coming from the wound, scrape, corn, or callus. ? Your wound, scrape, corn, or callus feels warm to the touch. ? You have pus or a bad smell coming from the wound, scrape, corn, or callus. ? You have a fever. ? You have a red line going up your leg. Summary  Check your feet every day for blisters, cuts, bruises, sores, and redness.  Apply a moisturizing lotion or petroleum jelly to the skin on your feet and to dry, brittle toenails.  Wear shoes that fit properly and have enough cushioning.  If you have foot problems, report any cuts, sores, or bruises to your health care provider immediately.  Schedule a complete foot exam at least once a year (annually) or more often if you have foot problems. This information is not intended to replace advice given to you by your health care provider. Make sure you discuss any questions you have with your health care provider. Document Revised: 11/07/2019 Document Reviewed: 11/07/2019 Elsevier Patient Education  [place] Maintenance,  Male Adopting a healthy lifestyle and getting preventive care are important in promoting health and wellness. Ask your health care provider about:  The right schedule for you to have regular tests and exams.  Things you can do on your own to prevent diseases and keep yourself healthy. What should I know about diet, weight, and exercise? Eat a healthy diet  Eat a diet that includes plenty of vegetables, fruits, low-fat dairy products, and lean protein.  Do not eat a lot of foods that are high in solid fats, added sugars, or sodium.   Maintain a healthy weight Body mass index (BMI) is a measurement that can be used to identify possible weight problems. It estimates body fat based on height and weight. Your health care provider can help determine your BMI and help you achieve or maintain a healthy weight. Get regular exercise Get regular exercise. This is one of the most important things you can do for your health. Most adults should:  Exercise for at least 150 minutes each week. The exercise should increase your heart rate and make you sweat (moderate-intensity exercise).  Do strengthening exercises at least twice a week. This is in addition to the moderate-intensity exercise.  Spend less time sitting. Even light physical activity can be beneficial. Watch cholesterol and blood lipids Have your blood tested for lipids and cholesterol at 78 years of age, then have this test every 5 years. You may need to have your cholesterol levels checked more often if:  Your lipid or cholesterol levels are high.  You are older than 78 years of age.  You are at high risk for heart disease. What should I know about cancer screening? Many types of cancers can be detected early and may often be prevented. Depending on your health history and family history, you may need to have cancer screening at various ages. This may include screening for:  Colorectal cancer.  Prostate cancer.  Skin  cancer.  Lung cancer. What should I know about heart disease, diabetes, and high blood pressure? Blood pressure and heart disease  High blood pressure causes heart disease and increases the risk of stroke. This is more likely to develop in people who have high blood pressure readings, are of African descent, or are overweight.  Talk with your health care provider about your target  blood pressure readings.  Have your blood pressure checked: ? Every 3-5 years if you are 52-38 years of age. ? Every year if you are 12 years old or older.  If you are between the ages of 52 and 46 and are a current or former smoker, ask your health care provider if you should have a one-time screening for abdominal aortic aneurysm (AAA). Diabetes Have regular diabetes screenings. This checks your fasting blood sugar level. Have the screening done:  Once every three years after age 60 if you are at a normal weight and have a low risk for diabetes.  More often and at a younger age if you are overweight or have a high risk for diabetes. What should I know about preventing infection? Hepatitis B If you have a higher risk for hepatitis B, you should be screened for this virus. Talk with your health care provider to find out if you are at risk for hepatitis B infection. Hepatitis C Blood testing is recommended for:  Everyone born from [birth year] through 1963.  Anyone with known risk factors for hepatitis C. Sexually transmitted infections (STIs)  You should be screened each year for STIs, including gonorrhea and chlamydia, if: ? You are sexually active and are younger than 78 years of age. ? You are older than 78 years of age and your health care provider tells you that you are at risk for this type of infection. ? Your sexual activity has changed since you were last screened, and you are at increased risk for chlamydia or gonorrhea. Ask your health care provider if you are at risk.  Ask your health care provider  about whether you are at high risk for HIV. Your health care provider may recommend a prescription medicine to help prevent HIV infection. If you choose to take medicine to prevent HIV, you should first get tested for HIV. You should then be tested every 3 months for as long as you are taking the medicine. Follow these instructions at home: Lifestyle  Do not use any products that contain nicotine or tobacco, such as cigarettes, e-cigarettes, and chewing tobacco. If you need help quitting, ask your health care provider.  Do not use street drugs.  Do not share needles.  Ask your health care provider for help if you need support or information about quitting drugs. Alcohol use  Do not drink alcohol if your health care provider tells you not to drink.  If you drink alcohol: ? Limit how much you have to 0-2 drinks a day. ? Be aware of how much alcohol is in your drink. In the [place] one drink equals one 12 oz bottle of beer (355 mL), one 5 oz glass of wine (148 mL), or one 1 oz glass of hard liquor (44 mL). General instructions  Schedule regular health, dental, and eye exams.  Stay current with your vaccines.  Tell your health care provider if: ? You often feel depressed. ? You have ever been abused or do not feel safe at home. Summary  Adopting a healthy lifestyle and getting preventive care are important in promoting health and wellness.  Follow your health care provider's instructions about healthy diet, exercising, and getting tested or screened for diseases.  Follow your health care provider's instructions on monitoring your cholesterol and blood pressure. This information is not intended to replace advice given to you by your health care provider. Make sure you discuss any questions you have with your health care provider. Document Revised: 04/11/2018  Document Reviewed: 04/11/2018 Elsevier Patient Education  [place].

## 2020-08-03 ENCOUNTER — Encounter: Payer: Self-pay | Admitting: Family Medicine

## 2020-08-12 ENCOUNTER — Other Ambulatory Visit: Payer: Self-pay | Admitting: Family Medicine

## 2020-08-22 ENCOUNTER — Other Ambulatory Visit: Payer: Self-pay | Admitting: Family Medicine

## 2020-08-22 DIAGNOSIS — IMO0002 Reserved for concepts with insufficient information to code with codable children: Secondary | ICD-10-CM

## 2020-08-22 DIAGNOSIS — E1165 Type 2 diabetes mellitus with hyperglycemia: Secondary | ICD-10-CM

## 2020-08-30 ENCOUNTER — Other Ambulatory Visit: Payer: Self-pay | Admitting: Family Medicine

## 2020-09-07 ENCOUNTER — Other Ambulatory Visit: Payer: Self-pay | Admitting: Family Medicine

## 2020-09-16 ENCOUNTER — Other Ambulatory Visit: Payer: Self-pay | Admitting: Family Medicine

## 2020-09-21 ENCOUNTER — Other Ambulatory Visit: Payer: Self-pay

## 2020-09-21 ENCOUNTER — Ambulatory Visit: Payer: Medicare Other

## 2020-09-21 ENCOUNTER — Encounter: Payer: Self-pay | Admitting: Family Medicine

## 2020-09-21 ENCOUNTER — Ambulatory Visit
Admission: RE | Admit: 2020-09-21 | Discharge: 2020-09-21 | Disposition: A | Discharge status: Home / Self Care | Payer: Medicare Other | Source: Ambulatory Visit | Attending: Family Medicine | Admitting: Family Medicine

## 2020-09-21 ENCOUNTER — Ambulatory Visit (INDEPENDENT_AMBULATORY_CARE_PROVIDER_SITE_OTHER): Payer: Medicare Other | Admitting: Family Medicine

## 2020-09-21 VITALS — BP 134/54 | HR 73 | Ht 69.0 in | Wt 250.0 lb

## 2020-09-21 DIAGNOSIS — I700 Atherosclerosis of aorta: Secondary | ICD-10-CM

## 2020-09-21 DIAGNOSIS — IMO0002 Reserved for concepts with insufficient information to code with codable children: Secondary | ICD-10-CM

## 2020-09-21 DIAGNOSIS — E1165 Type 2 diabetes mellitus with hyperglycemia: Secondary | ICD-10-CM | POA: Diagnosis not present

## 2020-09-21 DIAGNOSIS — N644 Mastodynia: Secondary | ICD-10-CM | POA: Diagnosis not present

## 2020-09-21 DIAGNOSIS — R922 Inconclusive mammogram: Secondary | ICD-10-CM | POA: Diagnosis not present

## 2020-09-21 DIAGNOSIS — E118 Type 2 diabetes mellitus with unspecified complications: Secondary | ICD-10-CM | POA: Diagnosis not present

## 2020-09-21 DIAGNOSIS — I10 Essential (primary) hypertension: Secondary | ICD-10-CM

## 2020-09-21 DIAGNOSIS — I480 Paroxysmal atrial fibrillation: Secondary | ICD-10-CM | POA: Diagnosis not present

## 2020-09-21 DIAGNOSIS — N62 Hypertrophy of breast: Secondary | ICD-10-CM | POA: Diagnosis not present

## 2020-09-21 DIAGNOSIS — M170 Bilateral primary osteoarthritis of knee: Secondary | ICD-10-CM

## 2020-09-21 DIAGNOSIS — Z6835 Body mass index (BMI) 35.0-35.9, adult: Secondary | ICD-10-CM

## 2020-09-21 LAB — POCT GLYCOSYLATED HEMOGLOBIN (HGB A1C): HbA1c POC (<> result, manual entry): 9.2 % (ref 4.0–5.6)

## 2020-09-21 NOTE — Assessment & Plan Note (Signed)
We checked with our prior office person to see where we are at on getting his viscosupplementation scheduled.  Information given to [place].

## 2020-09-21 NOTE — Assessment & Plan Note (Signed)
Stable on Cardizem and Xarelto.  Would really encourage weight loss.

## 2020-09-21 NOTE — Assessment & Plan Note (Signed)
Continue daily statin. 

## 2020-09-21 NOTE — Assessment & Plan Note (Addendum)
Blood sugars are quite honestly all over the place.  They actually look much better in February.  A1c went from 8.0-9.2 and that was actually 6 increasing his insulin.  But then in March he will have clusters of 2 or 3 days where it really high it is over 200 and then will be under 200 and then it will go back up again.  More recently he even had a 300.  We discussed cutting out all sugary beverages including Pepsi and sweet tea and doing it for at least a week or 2 just to see if his blood sugars are better.  There is something I really feel dietary that he is doing that is really triggering this but I cannot quite put my finger on it he says he is not eating lots of fruit.

## 2020-09-21 NOTE — Assessment & Plan Note (Addendum)
BP looks okay.

## 2020-09-21 NOTE — Progress Notes (Signed)
Established Patient Office Visit  Subjective:  Patient ID: Tristan Mayer, male    DOB: [birth year]  Age: 80 y.o. MRN: [id number]  CC:  Chief Complaint  Patient presents with  . Diabetes    HPI Clide Remmers presents for   Diabetes - no hypoglycemic events. No wounds or sores that are not healing well. No increased thirst or urination. Checking glucose at home. Taking medications as prescribed without any side effects.  Is concerned that since he went up on his insulin that his sugars are have actually gone higher.  His A1c was 8.4, then came down to 8.0 when I last saw him.  Currently using 42 units of Tresiba on Monday Wednesday and Friday and 58 units all other days.  Hypertension- Pt denies chest pain, SOB, dizziness, or heart palpitations.  Taking meds as directed w/o problems.  Denies medication side effects.  He has lost about 2 pounds since he was last here.  He says his knees are still bothering him.  He saw Dr. Darene Lamer for viscosupplementation but has been waiting them about 2 months to hear back to see if he can get covered with his insurance.  Past Medical History:  Diagnosis Date  . Atrial fibrillation ([place])   . CVA (cerebral vascular accident) ([place])   . Diabetes mellitus ([place])   . GERD (gastroesophageal reflux disease)   . Hyperlipidemia   . PUD (peptic ulcer disease)     Past Surgical History:  Procedure Laterality Date  . CATARACT EXTRACTION W/ INTRAOCULAR LENS IMPLANT Right     Family History  Problem Relation Age of Onset  . Prostate cancer Father 20  . Stroke Father   . Diabetes Brother   . Diabetes Brother   . Atrial fibrillation Mother     Social History   Socioeconomic History  . Marital status: Married    Spouse name: [name]  . Number of children: 3  . Years of education: 12th Grade  . Highest education level: 12th grade  Occupational History  . Occupation: Retired  Tobacco Use  . Smoking status: Current Some Day Smoker    Types: Cigars  .  Smokeless tobacco: Never Used  Substance and Sexual Activity  . Alcohol use: Yes    Comment: Occasional  . Drug use: Never  . Sexual activity: Not on file  Other Topics Concern  . Not on file  Social History Narrative   Lives with his wife. He enjoys playing golf three times a week.   Social Determinants of Health   Financial Resource Strain: Low Risk   . Difficulty of Paying Living Expenses: Not hard at all  Food Insecurity: No Food Insecurity  . Worried About Charity fundraiser in the Last Year: Never true  . Ran Out of Food in the Last Year: Never true  Transportation Needs: No Transportation Needs  . Lack of Transportation (Medical): No  . Lack of Transportation (Non-Medical): No  Physical Activity: Inactive  . Days of Exercise per Week: 0 days  . Minutes of Exercise per Session: 0 min  Stress: No Stress Concern Present  . Feeling of Stress : Not at all  Social Connections: Moderately Isolated  . Frequency of Communication with Friends and Family: Once a week  . Frequency of Social Gatherings with Friends and Family: Never  . Attends Religious Services: More than 4 times per year  . Active Member of Clubs or Organizations: No  . Attends Archivist Meetings: Never  .  Marital Status: Married  Human resources officer Violence: Not At Risk  . Fear of Current or Ex-Partner: No  . Emotionally Abused: No  . Physically Abused: No  . Sexually Abused: No    Outpatient Medications Prior to Visit  Medication Sig Dispense Refill  . acetaminophen (TYLENOL) 650 MG CR tablet Take 1 tablet (650 mg total) by mouth every 8 (eight) hours as needed for pain. 90 tablet 3  . Blood Glucose Calibration (ACCU-CHEK GUIDE CONTROL) LIQD     . blood glucose meter kit and supplies Dispense based on patient and insurance preference. Use up to four times daily as directed. Dx: E11.8, E11.65 1 each 0  . Blood Glucose Monitoring Suppl (ONETOUCH VERIO REFLECT) w/Device KIT 1 each by Does not apply  route 2 (two) times daily. Dx: E11.8, E11.65 - Check fasting blood sugar every morning and once 2 hours after largest meal of the day. 1 kit prn  . Co-Enzyme Q-10 30 MG CAPS Take 400 mg by mouth daily.    Marland Kitchen diltiazem (CARDIZEM CD) 120 MG 24 hr capsule Take 1 capsule by mouth once daily 90 capsule 0  . famotidine (PEPCID) 20 MG tablet Take 20 mg by mouth daily.    . finasteride (PROSCAR) 5 MG tablet TAKE 1 TABLET BY MOUTH AT BEDTIME 90 tablet 3  . gabapentin (NEURONTIN) 300 MG capsule TAKE 2 CAPSULES BY MOUTH IN THE MORNING AND 3 IN THE EVENING 450 capsule 0  . glipiZIDE (GLUCOTROL) 10 MG tablet TAKE 1 TABLET BY MOUTH TWICE DAILY BEFORE MEAL(S) 180 tablet 0  . glucose blood (ONETOUCH VERIO) test strip Dx: E11.8, E11.65 - Check fasting blood sugar every morning and once 2 hours after largest meal of the day. 100 each 12  . Insulin Pen Needle (BD ULTRA-FINE PEN NEEDLES) 29G X 12.7MM MISC USE 1  TWICE DAILY WITH PENS 100 each prn  . losartan (COZAAR) 25 MG tablet Take 1 tablet by mouth once daily 90 tablet 1  . Melatonin 10 MG TABS Take 1 tablet by mouth daily. As needed.    . metFORMIN (GLUCOPHAGE) 1000 MG tablet TAKE 1 TABLET BY MOUTH TWICE DAILY WITH MEALS 180 tablet 0  . Potassium 99 MG TABS Take 5 tablets by mouth daily. Takes it As needed.    . rivaroxaban (XARELTO) 20 MG TABS tablet TAKE 1 TABLET BY MOUTH ONCE DAILY WITH SUPPER 90 tablet 3  . rosuvastatin (CRESTOR) 40 MG tablet Take 1 tablet by mouth once daily 90 tablet 3  . sertraline (ZOLOFT) 100 MG tablet Take 1 tablet by mouth once daily 90 tablet 0  . tamsulosin (FLOMAX) 0.4 MG CAPS capsule Take 1 capsule by mouth twice daily 180 capsule 0  . TRESIBA FLEXTOUCH 100 UNIT/ML FlexTouch Pen INJECT 65 TO 80 UNITS SUBCUTANEOUSLY ONCE DAILY 15 mL 2  . Ultra Thin Lancets 31G MISC     . Sodium Hyaluronate, Viscosup, (GELSYN-3) 16.8 MG/2ML SOSY Inject 16.8 mg into the articular space once a week. Injected into the knee weekly x3, dispense #6  syringes (bilateral injections) (Patient not taking: Reported on 07/27/2020) 12 mL 3  . traMADol (ULTRAM) 50 MG tablet Take 1-2 tablets (50-100 mg total) by mouth every 8 (eight) hours as needed for moderate pain. Maximum 6 tabs per day. (Patient not taking: Reported on 07/27/2020) 21 tablet 0   No facility-administered medications prior to visit.    Allergies  Allergen Reactions  . Statins Other (See Comments)    Other reaction(s): Other All  statin drugs Leg cramps when he was on atorvastatin and fenofibrate simultaneously. Discussed with his PCP Dr. Remus Blake on the phone [phone]) All statin drugs Leg cramps when he was on atorvastatin and fenofibrate simultaneously. Discussed with his PCP Dr. Remus Blake on the phone ([phone]) All statin drugs   . Eliquis [Apixaban] Other (See Comments)    Feels weak   . Lisinopril Other (See Comments)    Weakness    ROS Review of Systems    Objective:    Physical Exam Vitals reviewed.  Constitutional:      Appearance: He is well-developed.  HENT:     Head: Normocephalic and atraumatic.  Eyes:     Conjunctiva/sclera: Conjunctivae normal.  Cardiovascular:     Rate and Rhythm: Normal rate.  Pulmonary:     Effort: Pulmonary effort is normal.  Skin:    General: Skin is dry.     Coloration: Skin is not pale.  Neurological:     Mental Status: He is alert and oriented to person, place, and time.  Psychiatric:        Behavior: Behavior normal.     BP (!) 134/54   Pulse 73   Ht $R'5\' 9"'FJ$  (1.753 m)   Wt 250 lb (113.4 kg)   SpO2 97%   BMI 36.92 kg/m  Wt Readings from Last 3 Encounters:  09/21/20 250 lb (113.4 kg)  06/22/20 252 lb (114.3 kg)  05/06/20 254 lb (115.2 kg)     There are no preventive care reminders to display for this patient.  There are no preventive care reminders to display for this patient.  No results found for: TSH Lab Results  Component Value Date   WBC 7.8 06/22/2020   HGB 13.5 06/22/2020   HCT 39.3  06/22/2020   MCV 86.9 06/22/2020   PLT 190 06/22/2020   Lab Results  Component Value Date   NA 138 06/22/2020   K 4.7 06/22/2020   CO2 25 06/22/2020   GLUCOSE 162 (H) 06/22/2020   BUN 27 (H) 06/22/2020   CREATININE 1.56 (H) 06/22/2020   BILITOT 0.9 06/22/2020   AST 15 06/22/2020   ALT 20 06/22/2020   PROT 6.6 06/22/2020   CALCIUM 9.5 06/22/2020   Lab Results  Component Value Date   CHOL 111 06/22/2020   Lab Results  Component Value Date   HDL 30 (L) 06/22/2020   Lab Results  Component Value Date   LDLCALC 53 06/22/2020   Lab Results  Component Value Date   TRIG 213 (H) 06/22/2020   Lab Results  Component Value Date   CHOLHDL 3.7 06/22/2020   Lab Results  Component Value Date   HGBA1C 9.2 09/21/2020      Assessment & Plan:   Problem List Items Addressed This Visit      Cardiovascular and Mediastinum   Paroxysmal A-fib (HCC)    Stable on Cardizem and Xarelto.  Would really encourage weight loss.      Benign essential hypertension    BP looks okay.      Aortic atherosclerosis (HCC)    Continue daily statin.        Endocrine   Uncontrolled diabetes mellitus with complication, without long-term current use of insulin ([place]) - Primary    Blood sugars are quite honestly all over the place.  They actually look much better in February.  A1c went from 8.0-9.2 and that was actually 6 increasing his insulin.  But then in March he will have clusters of 2 or  3 days where it really high it is over 200 and then will be under 200 and then it will go back up again.  More recently he even had a 300.  We discussed cutting out all sugary beverages including Pepsi and sweet tea and doing it for at least a week or 2 just to see if his blood sugars are better.  There is something I really feel dietary that he is doing that is really triggering this but I cannot quite put my finger on it he says he is not eating lots of fruit.      Relevant Orders   POCT glycosylated  hemoglobin (Hb A1C) (Completed)     Musculoskeletal and Integument   Primary osteoarthritis of both knees    We checked with our prior office person to see where we are at on getting his viscosupplementation scheduled.  Information given to [place].        Other   Severe obesity (BMI 35.0-35.9 with comorbidity) ([place])      No orders of the defined types were placed in this encounter.   Follow-up: Return in about 3 months (around 12/22/2020) for Diabetes follow-up.    Beatrice Lecher, MD

## 2020-09-22 ENCOUNTER — Telehealth: Payer: Self-pay | Admitting: Family Medicine

## 2020-09-22 NOTE — Chronic Care Management (AMB) (Signed)
  Chronic Care Management   Note  09/22/2020 Name: Tristan Mayer MRN: [id number] DOB: 04-Jul-1940  Tristan Mayer is a 80 y.o. year old male who is a primary care patient of Metheney, Rene Kocher, MD. I reached out to Tawni Levy by phone today in response to a referral sent by [name] PCP, Hali Marry, MD.   Tristan Mayer was given information about Chronic Care Management services today including:  1. CCM service includes personalized support from designated clinical staff supervised by his physician, including individualized plan of care and coordination with other care providers 2. 24/7 contact phone numbers for assistance for urgent and routine care needs. 3. Service will only be billed when office clinical staff spend 20 minutes or more in a month to coordinate care. 4. Only one practitioner may furnish and bill the service in a calendar month. 5. The patient may stop CCM services at any time (effective at the end of the month) by phone call to the office staff.   Patient agreed to services and verbal consent obtained.   Follow up plan:   Lauretta Grill [facility] Scheduler

## 2020-10-07 ENCOUNTER — Other Ambulatory Visit: Payer: Self-pay | Admitting: Family Medicine

## 2020-10-28 ENCOUNTER — Telehealth: Payer: Self-pay | Admitting: Family Medicine

## 2020-10-28 ENCOUNTER — Ambulatory Visit (INDEPENDENT_AMBULATORY_CARE_PROVIDER_SITE_OTHER): Payer: Medicare Other | Admitting: Pharmacist

## 2020-10-28 ENCOUNTER — Other Ambulatory Visit: Payer: Self-pay

## 2020-10-28 DIAGNOSIS — I480 Paroxysmal atrial fibrillation: Secondary | ICD-10-CM | POA: Diagnosis not present

## 2020-10-28 DIAGNOSIS — E118 Type 2 diabetes mellitus with unspecified complications: Secondary | ICD-10-CM | POA: Diagnosis not present

## 2020-10-28 DIAGNOSIS — IMO0002 Reserved for concepts with insufficient information to code with codable children: Secondary | ICD-10-CM

## 2020-10-28 DIAGNOSIS — I700 Atherosclerosis of aorta: Secondary | ICD-10-CM

## 2020-10-28 DIAGNOSIS — E1165 Type 2 diabetes mellitus with hyperglycemia: Secondary | ICD-10-CM | POA: Diagnosis not present

## 2020-10-28 DIAGNOSIS — I10 Essential (primary) hypertension: Secondary | ICD-10-CM

## 2020-10-28 NOTE — Telephone Encounter (Signed)
Call pt: our pharmacist Tristan Mayer let me know that you have been experiencing chest pain and held your cardizem and felt better.  We can certainly change you medication but when did you start havng chest pain? You have been on the cardizem for several years.

## 2020-10-28 NOTE — Patient Instructions (Signed)
Visit Information   PATIENT GOALS:   Goals Addressed             This Visit's Progress    Medication Management       Patient Goals/Self-Care Activities Over the next 30 days, patient will:  take medications as prescribed, check glucose daily AM and several times per week in afternoons, document, and provide at future appointments, and collaborate with provider on medication access solutions  Follow Up Plan: Telephone follow up appointment with care management team member scheduled for:  1 month          Consent to CCM Services: Tristan Mayer was given information about Chronic Care Management services today including:  CCM service includes personalized support from designated clinical staff supervised by his physician, including individualized plan of care and coordination with other care providers 24/7 contact phone numbers for assistance for urgent and routine care needs. Service will only be billed when office clinical staff spend 20 minutes or more in a month to coordinate care. Only one practitioner may furnish and bill the service in a calendar month. The patient may stop CCM services at any time (effective at the end of the month) by phone call to the office staff. The patient will be responsible for cost sharing (co-pay) of up to 20% of the service fee (after annual deductible is met).  Patient agreed to services and verbal consent obtained.   Patient verbalizes understanding of instructions provided today and agrees to view in [place].   Telephone follow up appointment with care management team member scheduled for: 1 month    CLINICAL CARE PLAN: Patient Care Plan: Medication Management     Problem Identified: AFib, DM, HTN, HLD      Long-Range Goal: Disease Progression Prevention   This Visit's Progress: On track  Priority: High  Note:   Current Barriers:  Unable to independently afford treatment regimen Unable to achieve control of diabetes   Pharmacist  Clinical Goal(s):  Over the next 30 days, patient will achieve control of diabetes  as evidenced by checking blood sugar AM & afternoon, a1c through collaboration with PharmD and provider.   Interventions: 1:1 collaboration with Tristan Marry, MD regarding development and update of comprehensive plan of care as evidenced by provider attestation and co-signature Inter-disciplinary care team collaboration (see longitudinal plan of care) Comprehensive medication review performed; medication list updated in electronic medical record  Diabetes:  Uncontrolled; current treatment:tresiba (Dosing: 52 units Mon, Tues, Thurs night, prior to days patient plays golf, all other nights = 58 units) , metformin 1g BID, glipizide 29m BID; a1c 9.2  Current glucose readings: fasting glucose: 98, most days under 130s post prandial glucose: not currently checking  Reports hypoglycemic symptoms rarely, BG in 100s, never below 70.  Current meal patterns: breakfast: 3 bacon, 2 eggs, toast, or cereal, or oatmeal; lunch: leftovers from dinner, ham or roast beef sandwich; dinner: frozen dinners; snacks: rarely snacks, popcorn, oatmeal cookie; drinks: water, pepsi w/meals, originally 4-5 per day, now down to 1 per day. Gatorade when golfs  Current exercise: golf, chair exercises  Educated on goals for fasting vs postprandial glucose, Recommended continue current medications, ADD a few BG checks ~2hr after a meal in afternoons several times per week & bring to next appt for review,   Hypertension:  Uncontrolled; current treatment:losartan 248m diltiazem 1209maily;   Current home readings: 150/70s  Denies hypotensive/hypertensive symptoms, but states diltiazem is causing him chest discomfort/tightness. He stopped taking it briefly &  chest tightness went away, & came back when restarted, so is attributing to this particular medication  Educated on BP goal of at least 140/80, and ideally 130/80 if can safely achieve  due to concomitant hx of diabetes Recommended continue current medication for now, consider discontinue diltiazem 118m & replace with carvedilol 12.510mBID, pending pharmacist discussion w/PCP,   Hyperlipidemia:  Controlled; current treatment:rosuvastatin 4083mLDL 53  Recommended continue current regimen Atrial Fibrillation:  Controlled; current rate/rhythm control diltiazem 120m33mily; anticoagulant treatment: rivaroxaban 20mg29mly  Recommended continue current medications Assessed patient finances. Patient states he falls into donut hole August through December at which point xarelto & insulin become challenging to afford & obtain, pt gets financial help from his children otherwise would be unable to fill the medication. Will research cost assistance & income eligibility.   Patient Goals/Self-Care Activities Over the next 30 days, patient will:  take medications as prescribed, check glucose daily AM and several times per week in afternoons, document, and provide at future appointments, and collaborate with provider on medication access solutions  Follow Up Plan: Telephone follow up appointment with care management team member scheduled for:  1 month

## 2020-10-28 NOTE — Progress Notes (Signed)
Chronic Care Management Pharmacy Note  10/28/2020 Name:  Tristan Mayer MRN:  [id number] DOB:  1940/06/27  Summary: addressed Afib, DM, HTN, HLD. Patient reports chest discomfort with diltiazem - went away when pt took a trial period off of it, and returned when restarted. Also reports cost burden with insulin + Xarelto when hits donut hole August-December.  Recommendations/Changes made from today's visit: Recommend dc diltiazem, replace with coreg 12.5 mg BID. Recommend patient check BG several times per week in afternoons to obtain more data on BG patterns to optimize glycemic control.   Plan: f/u with pharmacist in 1 month  Subjective: Tristan Mayer is an 80 y.o. year old male who is a primary patient of Metheney, Rene Kocher, MD.  The CCM team was consulted for assistance with disease management and care coordination needs.    Engaged with patient by telephone for initial visit in response to provider referral for pharmacy case management and/or care coordination services.   Consent to Services:  The patient was given information about Chronic Care Management services, agreed to services, and gave verbal consent prior to initiation of services.  Please see initial visit note for detailed documentation.   Patient Care Team: Hali Marry, MD as PCP - General (Family Medicine) Darius Bump, [facility] as Pharmacist (Pharmacist)   Objective:  Lab Results  Component Value Date   CREATININE 1.56 (H) 06/22/2020   CREATININE 1.21 (H) 12/16/2019   CREATININE 1.46 (H) 05/15/2019    Lab Results  Component Value Date   HGBA1C 9.2 09/21/2020   Last diabetic Eye exam:  Lab Results  Component Value Date/Time   HMDIABEYEEXA No Retinopathy 04/19/2019 12:00 AM    Last diabetic Foot exam: No results found for: HMDIABFOOTEX      Component Value Date/Time   CHOL 111 06/22/2020 1026   TRIG 213 (H) 06/22/2020 1026   HDL 30 (L) 06/22/2020 1026   CHOLHDL 3.7 06/22/2020 1026   LDLCALC  53 06/22/2020 1026    Hepatic Function Latest Ref Rng & Units 06/22/2020 05/15/2019 02/28/2018  Total Protein 6.1 - 8.1 g/dL 6.6 6.8 6.2  AST 10 - 35 U/L _0 ALT 9 - 46 U/L _1 Total Bilirubin 0.2 - 1.2 mg/dL 0.9 1.1 0.9    No results found for: TSH, FREET4  CBC Latest Ref Rng & Units 06/22/2020 09/20/2017  WBC 3.8 - 10.8 Thousand/uL 7.8 6.6  Hemoglobin 13.2 - 17.1 g/dL 13.5 14.1  Hematocrit 38.5 - 50.0 % 39.3 40.9  Platelets 140 - 400 Thousand/uL 190 179    Social History   Tobacco Use  Smoking Status Some Days   Pack years: 0.00   Types: Cigars  Smokeless Tobacco Never   BP Readings from Last 3 Encounters:  09/21/20 (!) 134/54  06/22/20 (!) 122/55  05/06/20 (!) 126/55   Pulse Readings from Last 3 Encounters:  09/21/20 73  06/22/20 (!) 59  05/06/20 (!) 108   Wt Readings from Last 3 Encounters:  09/21/20 250 lb (113.4 kg)  06/22/20 252 lb (114.3 kg)  05/06/20 254 lb (115.2 kg)    Assessment: Review of patient past medical history, allergies, medications, health status, including review of consultants reports, laboratory and other test data, was performed as part of comprehensive evaluation and provision of chronic care management services.   SDOH:  (Social Determinants of Health) assessments and interventions performed:    CCM Care Plan  Allergies  Allergen Reactions   Statins Other (See Comments)  Other reaction(s): Other All statin drugs Leg cramps when he was on atorvastatin and fenofibrate simultaneously. Discussed with his PCP Dr. Remus Blake on the phone [phone]) All statin drugs Leg cramps when he was on atorvastatin and fenofibrate simultaneously. Discussed with his PCP Dr. Remus Blake on the phone [phone]) All statin drugs    Eliquis [Apixaban] Other (See Comments)    Feels weak    Lisinopril Other (See Comments)    Weakness    Medications Reviewed Today     Reviewed by Darius Bump, [facility] (Pharmacist) on 10/28/20 at 0934  Med  List Status: <None>   Medication Order Taking? Sig Documenting Provider Last Dose Status Informant  acetaminophen (TYLENOL) 650 MG CR tablet [id number] Yes Take 1 tablet (650 mg total) by mouth every 8 (eight) hours as needed for pain. Silverio Decamp, MD Taking Active   Blood Glucose Calibration (ACCU-CHEK GUIDE CONTROL) Yehuda Budd [id number] Yes  [provider] Taking Active   blood glucose meter kit and supplies [id number] Yes Dispense based on patient and insurance preference. Use up to four times daily as directed. Dx: E11.8, E11.65 Hali Marry, MD Taking Active   Blood Glucose Monitoring Suppl [facility] VERIO REFLECT) w/Device KIT [id number] Yes 1 each by Does not apply route 2 (two) times daily. Dx: E11.8, E11.65 - Check fasting blood sugar every morning and once 2 hours after largest meal of the day. Hali Marry, MD Taking Active   Co-Enzyme Q-10 30 MG CAPS [id number] Yes Take 400 mg by mouth daily. [provider] Taking Active   diltiazem (CARDIZEM CD) 120 MG 24 hr capsule [id number] No Take 1 capsule by mouth once daily  Patient not taking: Reported on 10/28/2020   Hali Marry, MD Not Taking Active            Med Note Dorene Ar Oct 28, 2020  9:25 AM) Chest discomfort  famotidine (PEPCID) 20 MG tablet [id number] Yes Take 20 mg by mouth daily. [provider] Taking Active   finasteride (PROSCAR) 5 MG tablet [id number] Yes TAKE 1 TABLET BY MOUTH AT BEDTIME Hali Marry, MD Taking Active   gabapentin (NEURONTIN) 300 MG capsule [id number] Yes TAKE 2 CAPSULES BY MOUTH IN THE MORNING AND 3 IN THE Erskine Emery, MD Taking Active   glipiZIDE (GLUCOTROL) 10 MG tablet [id number] Yes TAKE 1 TABLET BY MOUTH TWICE DAILY BEFORE MEAL(S) Hali Marry, MD Taking Active   glucose blood (ONETOUCH VERIO) test strip [id number] Yes Dx: E11.8, E11.65 - Check fasting blood sugar every morning and once 2 hours after  largest meal of the day. Hali Marry, MD Taking Active   Insulin Pen Needle (BD ULTRA-FINE PEN NEEDLES) 29G X 12.7MM MISC [id number] Yes USE 1  TWICE DAILY WITH PENS Hali Marry, MD Taking Active   losartan (COZAAR) 25 MG tablet [id number] Yes Take 1 tablet by mouth once daily Hali Marry, MD Taking Active   Melatonin 10 MG TABS [id number] Yes Take 1 tablet by mouth daily. As needed. [provider] Taking Active   metFORMIN (GLUCOPHAGE) 1000 MG tablet [id number] Yes TAKE 1 TABLET BY MOUTH TWICE DAILY WITH MEALS Hali Marry, MD Taking Active   rivaroxaban (XARELTO) 20 MG TABS tablet [id number] Yes TAKE 1 TABLET BY MOUTH ONCE DAILY WITH SUPPER Hali Marry, MD Taking Active   rosuvastatin (CRESTOR) 40 MG tablet [id number] Yes Take 1 tablet by mouth once daily Hali Marry, MD Taking  Active   sertraline (ZOLOFT) 100 MG tablet [id number] Yes Take 1 tablet by mouth once daily Hali Marry, MD Taking Active   tamsulosin [facility]) 0.4 MG CAPS capsule [id number] Yes Take 1 capsule by mouth twice daily Hali Marry, MD Taking Active   TRESIBA FLEXTOUCH 100 UNIT/ML FlexTouch Pen [id number] Yes INJECT 65 TO 80 UNITS SUBCUTANEOUSLY ONCE DAILY Hali Marry, MD Taking Active   Ultra Thin Lancets 31G [place] [id number] Yes  [provider] Taking Active             Patient Active Problem List   Diagnosis Date Noted   Idiopathic peripheral neuropathy 09/16/2019   BMI 36.0-36.9,adult 03/26/2019   Severe obesity (BMI 35.0-35.9 with comorbidity) ([place]) 08/22/2018   Morbid obesity ([place]) 05/23/2018   Aortic atherosclerosis ([place]) 03/12/2018   Primary osteoarthritis of both knees 01/03/2018   BPH (benign prostatic hyperplasia) 07/10/2017   INO (internuclear ophthalmoplegia), left 07/03/2017   Hearing loss 07/03/2017   ED (erectile dysfunction) 07/03/2017   Paroxysmal A-fib ([place]) 12/12/2016   Acute ischemic  vertebrobasilar artery brainstem stroke involving left-sided vessel ([place]) 12/09/2016   Thrombsis of left atrial appendage without antecedent myocardial infarction 12/09/2016   Uncontrolled diabetes mellitus with complication, without long-term current use of insulin ([place]) 08/14/2008   Other and unspecified hyperlipidemia 05/20/2008   Sleep apnea 07/19/2007   Allergic rhinitis 01/07/2007   Benign essential hypertension 01/07/2007   Gout, unspecified 01/07/2007    Immunization History  Administered Date(s) Administered   Fluad Quad(high Dose 65+) 02/25/2019   Influenza, High Dose Seasonal PF 02/18/2015, 02/06/2017, 01/12/2018, 03/03/2020   Influenza, Seasonal, Injecte, Preservative Fre 02/08/2016   Influenza-Unspecified 02/08/2016, 03/02/2017   Janssen (J&J) SARS-COV-2 Vaccination 07/13/2019, 05/18/2020   PPD Test 03/02/2017   Pneumococcal Conjugate-13 01/19/2016   Pneumococcal Polysaccharide-23 09/30/2000, 02/28/2013   Tdap 05/02/2009    Conditions to be addressed/monitored: Atrial Fibrillation, HTN, HLD, and DMII  Care Plan : Medication Management  Updates made by Darius Bump, [place] since 10/28/2020 12:00 AM     Problem: AFib, DM, HTN, HLD      Long-Range Goal: Disease Progression Prevention   This Visit's Progress: On track  Priority: High  Note:   Current Barriers:  Unable to independently afford treatment regimen Unable to achieve control of diabetes   Pharmacist Clinical Goal(s):  Over the next 30 days, patient will achieve control of diabetes  as evidenced by checking blood sugar AM & afternoon, a1c through collaboration with PharmD and provider.   Interventions: 1:1 collaboration with Hali Marry, MD regarding development and update of comprehensive plan of care as evidenced by provider attestation and co-signature Inter-disciplinary care team collaboration (see longitudinal plan of care) Comprehensive medication review performed; medication list  updated in electronic medical record  Diabetes:  Uncontrolled; current treatment:tresiba (Dosing: 52 units Mon, Tues, Thurs night, prior to days patient plays golf, all other nights = 58 units) , metformin 1g BID, glipizide 47m BID; a1c 9.2  Current glucose readings: fasting glucose: 98, most days under 130s post prandial glucose: not currently checking  Reports hypoglycemic symptoms rarely, BG in 100s, never below 70.  Current meal patterns: breakfast: 3 bacon, 2 eggs, toast, or cereal, or oatmeal; lunch: leftovers from dinner, ham or roast beef sandwich; dinner: frozen dinners; snacks: rarely snacks, popcorn, oatmeal cookie; drinks: water, pepsi w/meals, originally 4-5 per day, now down to 1 per day. Gatorade when golfs  Current exercise: golf, chair exercises  Educated on goals for fasting vs postprandial  glucose, Recommended continue current medications, ADD a few BG checks ~2hr after a meal in afternoons several times per week & bring to next appt for review,   Hypertension:  Uncontrolled; current treatment:losartan 50m, diltiazem 1215mdaily;   Current home readings: 150/70s  Denies hypotensive/hypertensive symptoms, but states diltiazem is causing him chest discomfort/tightness. He stopped taking it briefly & chest tightness went away, & came back when restarted, so is attributing to this particular medication  Educated on BP goal of at least 140/80, and ideally 130/80 if can safely achieve due to concomitant hx of diabetes Recommended continue current medication for now, consider discontinue diltiazem 12053m replace with carvedilol 12.5mg63mD, pending pharmacist discussion w/PCP,   Hyperlipidemia:  Controlled; current treatment:rosuvastatin 40mg48mL 53  Recommended continue current regimen Atrial Fibrillation:  Controlled; current rate/rhythm control diltiazem 120mg 35my; anticoagulant treatment: rivaroxaban 20mg d65m  Recommended continue current medications Assessed patient  finances. Patient states he falls into donut hole August through December at which point xarelto & insulin become challenging to afford & obtain, pt gets financial help from his children otherwise would be unable to fill the medication. Will research cost assistance & income eligibility.   Patient Goals/Self-Care Activities Over the next 30 days, patient will:  take medications as prescribed, check glucose daily AM and several times per week in afternoons, document, and provide at future appointments, and collaborate with provider on medication access solutions  Follow Up Plan: Telephone follow up appointment with care management team member scheduled for:  1 month      Medication Assistance:  researching eligibility for patient assistance for xarelto and insulin, hits donut hole August through December.    [facility]  [facility] [place] [phone] [place] [address] [phone] [phone] [phone] box? Yes Pt endorses 100% compliance  Follow Up:  Patient agrees to Care Plan and Follow-up.  Plan: Telephone follow up appointment with care management team member scheduled for:  1 month  Wyeth Hoffer Darius Bump

## 2020-10-29 MED ORDER — CARVEDILOL 12.5 MG PO TABS: 12.5000 mg | ORAL_TABLET | Freq: Two times a day (BID) | ORAL | 3 refills | Status: DC

## 2020-10-29 NOTE — Telephone Encounter (Signed)
OK will d/c the dilt and start coreg in its place. F/U  in 2-3 weeks for nurse visit for BP check. Also if it has been awhile since has seen cardiology it is time to make a follow up appt. I want to make sure the chewt pain isn't coming from something else.    Meds ordered this encounter  Medications   carvedilol (COREG) 12.5 MG tablet    Sig: Take 1 tablet (12.5 mg total) by mouth 2 (two) times daily with a meal.    Dispense:  60 tablet    Refill:  3

## 2020-10-29 NOTE — Telephone Encounter (Signed)
Spoke w/pt he reports that this began 1 year ago.  He stated that he after he got the mammogram he was told to stop the losartan and he did but he continued to have the pain. So he restarted the losartan and stopped the diltiazem for 2 weeks and didn't really have any change in the pain so he restarted the diltiazem about 5 days ago. I advised him to stop taking for now until he hears back from our office for next steps.

## 2020-10-31 ENCOUNTER — Other Ambulatory Visit: Payer: Self-pay | Admitting: Family Medicine

## 2020-11-03 DIAGNOSIS — E113211 Type 2 diabetes mellitus with mild nonproliferative diabetic retinopathy with macular edema, right eye: Secondary | ICD-10-CM | POA: Diagnosis not present

## 2020-11-03 DIAGNOSIS — E113392 Type 2 diabetes mellitus with moderate nonproliferative diabetic retinopathy without macular edema, left eye: Secondary | ICD-10-CM | POA: Diagnosis not present

## 2020-11-03 DIAGNOSIS — H26491 Other secondary cataract, right eye: Secondary | ICD-10-CM | POA: Diagnosis not present

## 2020-11-03 DIAGNOSIS — H2512 Age-related nuclear cataract, left eye: Secondary | ICD-10-CM | POA: Diagnosis not present

## 2020-11-03 LAB — HM DIABETES EYE EXAM

## 2020-11-03 NOTE — Telephone Encounter (Signed)
Pt advised. He has a f/u appt with Lysle Morales on 7/18

## 2020-11-09 DIAGNOSIS — E113392 Type 2 diabetes mellitus with moderate nonproliferative diabetic retinopathy without macular edema, left eye: Secondary | ICD-10-CM | POA: Diagnosis not present

## 2020-11-09 DIAGNOSIS — H26491 Other secondary cataract, right eye: Secondary | ICD-10-CM | POA: Diagnosis not present

## 2020-11-09 DIAGNOSIS — H11001 Unspecified pterygium of right eye: Secondary | ICD-10-CM | POA: Diagnosis not present

## 2020-11-09 DIAGNOSIS — E113211 Type 2 diabetes mellitus with mild nonproliferative diabetic retinopathy with macular edema, right eye: Secondary | ICD-10-CM | POA: Diagnosis not present

## 2020-11-09 DIAGNOSIS — H25812 Combined forms of age-related cataract, left eye: Secondary | ICD-10-CM | POA: Diagnosis not present

## 2020-11-09 DIAGNOSIS — H527 Unspecified disorder of refraction: Secondary | ICD-10-CM | POA: Diagnosis not present

## 2020-11-16 ENCOUNTER — Other Ambulatory Visit: Payer: Self-pay

## 2020-11-16 ENCOUNTER — Ambulatory Visit (INDEPENDENT_AMBULATORY_CARE_PROVIDER_SITE_OTHER): Payer: Medicare Other | Admitting: Pharmacist

## 2020-11-16 DIAGNOSIS — E118 Type 2 diabetes mellitus with unspecified complications: Secondary | ICD-10-CM

## 2020-11-16 DIAGNOSIS — I480 Paroxysmal atrial fibrillation: Secondary | ICD-10-CM

## 2020-11-16 DIAGNOSIS — I10 Essential (primary) hypertension: Secondary | ICD-10-CM | POA: Diagnosis not present

## 2020-11-16 DIAGNOSIS — I700 Atherosclerosis of aorta: Secondary | ICD-10-CM

## 2020-11-16 DIAGNOSIS — E1165 Type 2 diabetes mellitus with hyperglycemia: Secondary | ICD-10-CM

## 2020-11-16 DIAGNOSIS — IMO0002 Reserved for concepts with insufficient information to code with codable children: Secondary | ICD-10-CM

## 2020-11-16 NOTE — Patient Instructions (Signed)
Visit Information  PATIENT GOALS:  Goals Addressed             This Visit's Progress    Medication Management       Patient Goals/Self-Care Activities Over the next 30 days, patient will:  take medications as prescribed, check glucose daily AM and several times per week in afternoons, document, and provide at future appointments, and collaborate with provider on medication access solutions  Follow Up Plan: Telephone follow up appointment with care management team member scheduled for:  4 months         Patient verbalizes understanding of instructions provided today and agrees to view in [place].   Telephone follow up appointment with care management team member scheduled for: 4 months  Darius Bump

## 2020-11-16 NOTE — Progress Notes (Signed)
Chronic Care Management Pharmacy Note  11/16/2020 Name:  Tristan Mayer MRN:  [id number] DOB:  August 10, 1940  Summary: addressed HTN, HLD, DM  Recommendations/Changes made from today's visit: Decrease coreg to 1/2 tablet (= 6.59m BID) related to pt concerns of BP running too low. Reinforced goal BP, advised that 120/70s is well-controlled, but should not feel poorly or have adverse symptoms.Pt reported "very sluggish, barely able to complete last 3 holes of golf." Counseled that beta blockers do have a fatigue associated that should overcome within 2-3 weeks.Advised pt to make appt with cardiologist for non-specific chest tightness that is not resolved by switch from diltiazem to coreg. Advised seek ER if acute chest pain. Advised to discuss more w/PCP at upcoming visit.  Plan: f/u with pharmacist in 4 months  Subjective: Tristan Montefuscois an 80y.o. year old male who is a primary patient of Tristan Mayer, CRene Kocher MD.  The CCM team was consulted for assistance with disease management and care coordination needs.    Engaged with patient by telephone for follow up visit in response to provider referral for pharmacy case management and/or care coordination services.   Consent to Services:  The patient was given information about Chronic Care Management services, agreed to services, and gave verbal consent prior to initiation of services.  Please see initial visit note for detailed documentation.   Patient Care Team: MHali Marry MD as PCP - General (Family Medicine) KDarius [facility] (Pharmacist)  Objective:  Lab Results  Component Value Date   CREATININE 1.56 (H) 06/22/2020   CREATININE 1.21 (H) 12/16/2019   CREATININE 1.46 (H) 05/15/2019    Lab Results  Component Value Date   HGBA1C 9.2 09/21/2020   Last diabetic Eye exam:  Lab Results  Component Value Date/Time   HMDIABEYEEXA No Retinopathy 04/19/2019 12:00 AM    Last diabetic Foot exam: No results  found for: HMDIABFOOTEX      Component Value Date/Time   CHOL 111 06/22/2020 1026   TRIG 213 (H) 06/22/2020 1026   HDL 30 (L) 06/22/2020 1026   CHOLHDL 3.7 06/22/2020 1026   LDLCALC 53 06/22/2020 1026    Hepatic Function Latest Ref Rng & Units 06/22/2020 05/15/2019 02/28/2018  Total Protein 6.1 - 8.1 g/dL 6.6 6.8 6.2  AST 10 - 35 U/L _0 ALT 9 - 46 U/L _1 Total Bilirubin 0.2 - 1.2 mg/dL 0.9 1.1 0.9    No results found for: TSH, FREET4  CBC Latest Ref Rng & Units 06/22/2020 09/20/2017  WBC 3.8 - 10.8 Thousand/uL 7.8 6.6  Hemoglobin 13.2 - 17.1 g/dL 13.5 14.1  Hematocrit 38.5 - 50.0 % 39.3 40.9  Platelets 140 - 400 Thousand/uL 190 179     Social History   Tobacco Use  Smoking Status Some Days   Types: Cigars  Smokeless Tobacco Never   BP Readings from Last 3 Encounters:  09/21/20 (!) 134/54  06/22/20 (!) 122/55  05/06/20 (!) 126/55   Pulse Readings from Last 3 Encounters:  09/21/20 73  06/22/20 (!) 59  05/06/20 (!) 108   Wt Readings from Last 3 Encounters:  09/21/20 250 lb (113.4 kg)  06/22/20 252 lb (114.3 kg)  05/06/20 254 lb (115.2 kg)    Assessment: Review of patient past medical history, allergies, medications, health status, including review of consultants reports, laboratory and other test data, was performed as part of comprehensive evaluation and provision of chronic care management services.   SDOH:  (  Social Determinants of Health) assessments and interventions performed:    CCM Care Plan  Allergies  Allergen Reactions   Statins Other (See Comments)    Other reaction(s): Other All statin drugs Leg cramps when he was on atorvastatin and fenofibrate simultaneously. Discussed with his PCP Dr. Remus Mayer on the phone [phone]) All statin drugs Leg cramps when he was on atorvastatin and fenofibrate simultaneously. Discussed with his PCP Dr. Remus Mayer on the phone [phone]) All statin drugs    Diltiazem Other (See Comments)    Chest  pain   Eliquis [Apixaban] Other (See Comments)    Feels weak    Lisinopril Other (See Comments)    Weakness    Medications Reviewed Today     Reviewed by Tristan Mayer, [facility] (Pharmacist) on 11/16/20 at [place] List Status: <None>   Medication Order Taking? Sig Documenting Provider Last Dose Status Informant  acetaminophen (TYLENOL) 650 MG CR tablet [id number] Yes Take 1 tablet (650 mg total) by mouth every 8 (eight) hours as needed for pain. Tristan Decamp, MD Taking Active   Blood Glucose Calibration (ACCU-CHEK GUIDE CONTROL) Yehuda Budd [id number] Yes  [provider] Taking Active   blood glucose meter kit and supplies [id number] Yes Dispense based on patient and insurance preference. Use up to four times daily as directed. Dx: E11.8, E11.65 Tristan Marry, MD Taking Active   Blood Glucose Monitoring Suppl [facility] VERIO REFLECT) w/Device KIT [id number] Yes 1 each by Does not apply route 2 (two) times daily. Dx: E11.8, E11.65 - Check fasting blood sugar every morning and once 2 hours after largest meal of the day. Tristan Marry, MD Taking Active   carvedilol (COREG) 12.5 MG tablet [id number] Yes Take 1 tablet (12.5 mg total) by mouth 2 (two) times daily with a meal. Tristan Marry, MD Taking Active   Co-Enzyme Q-10 30 MG CAPS [id number] Yes Take 400 mg by mouth daily. [provider] Taking Active   famotidine (PEPCID) 20 MG tablet [id number] Yes Take 20 mg by mouth daily. [provider] Taking Active   finasteride (PROSCAR) 5 MG tablet [id number] Yes TAKE 1 TABLET BY MOUTH AT BEDTIME Tristan Marry, MD Taking Active   gabapentin (NEURONTIN) 300 MG capsule [id number] Yes TAKE 2 CAPSULES BY MOUTH IN THE MORNING AND 3 IN THE Tristan Emery, MD Taking Active   glipiZIDE (GLUCOTROL) 10 MG tablet [id number] Yes TAKE 1 TABLET BY MOUTH TWICE DAILY BEFORE MEAL(S) Tristan Marry, MD Taking Active   glucose blood (ONETOUCH  VERIO) test strip [id number] Yes Dx: E11.8, E11.65 - Check fasting blood sugar every morning and once 2 hours after largest meal of the day. Tristan Marry, MD Taking Active   Insulin Pen Needle (BD ULTRA-FINE PEN NEEDLES) 29G X 12.7MM MISC [id number] Yes USE 1  TWICE DAILY WITH PENS Tristan Marry, MD Taking Active   losartan (COZAAR) 25 MG tablet [id number] Yes Take 1 tablet by mouth once daily Tristan Marry, MD Taking Active   Melatonin 10 MG TABS [id number] Yes Take 1 tablet by mouth daily. As needed. [provider] Taking Active   metFORMIN (GLUCOPHAGE) 1000 MG tablet [id number] Yes TAKE 1 TABLET BY MOUTH TWICE DAILY WITH MEALS Tristan Marry, MD Taking Active   rivaroxaban (XARELTO) 20 MG TABS tablet [id number] Yes TAKE 1 TABLET BY MOUTH ONCE DAILY WITH SUPPER Tristan Marry, MD Taking Active   rosuvastatin (CRESTOR) 40 MG tablet [id number] Yes Take 1 tablet by  mouth once daily Tristan Marry, MD Taking Active   sertraline (ZOLOFT) 100 MG tablet [id number] Yes Take 1 tablet by mouth once daily Tristan Marry, MD Taking Active   tamsulosin [facility]) 0.4 MG CAPS capsule [id number] Yes Take 1 capsule by mouth twice daily Tristan Marry, MD Taking Active   TRESIBA FLEXTOUCH 100 UNIT/ML FlexTouch Pen [id number] Yes INJECT 65 TO 80 UNITS SUBCUTANEOUSLY ONCE DAILY Tristan Marry, MD Taking Active            Med Note Dorene Ar Oct 28, 2020  3:29 PM) On Mon, Tues, Thurs night takes 52 units (prior to golfing days) and all other nights take 58 units.  Ultra Thin Lancets 31G MISC [id number] Yes  [provider] Taking Active             Patient Active Problem List   Diagnosis Date Noted   Idiopathic peripheral neuropathy 09/16/2019   BMI 36.0-36.9,adult 03/26/2019   Severe obesity (BMI 35.0-35.9 with comorbidity) ([place]) 08/22/2018   Morbid obesity ([place]) 05/23/2018   Aortic atherosclerosis ([place]) 03/12/2018    Primary osteoarthritis of both knees 01/03/2018   BPH (benign prostatic hyperplasia) 07/10/2017   INO (internuclear ophthalmoplegia), left 07/03/2017   Hearing loss 07/03/2017   ED (erectile dysfunction) 07/03/2017   Paroxysmal A-fib ([place]) 12/12/2016   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 ([place]) 12/09/2016   Thrombsis of left atrial appendage without antecedent myocardial infarction 12/09/2016   Uncontrolled diabetes mellitus with complication, without long-term current use of insulin ([place]) 08/14/2008   Other and unspecified hyperlipidemia 05/20/2008   Sleep apnea 07/19/2007   Allergic rhinitis 01/07/2007   Benign essential hypertension 01/07/2007   Gout, unspecified 01/07/2007    Immunization History  Administered Date(s) Administered   Fluad Quad(high Dose 65+) 02/25/2019   Influenza, High Dose Seasonal PF 02/18/2015, 02/06/2017, 01/12/2018, 03/03/2020   Influenza, Seasonal, Injecte, Preservative Fre 02/08/2016   Influenza-Unspecified 02/08/2016, 03/02/2017   Janssen (J&J) SARS-COV-2 Vaccination 07/13/2019, 05/18/2020   PPD Test 03/02/2017   Pneumococcal Conjugate-13 01/19/2016   Pneumococcal Polysaccharide-23 09/30/2000, 02/28/2013   Tdap 05/02/2009    Conditions to be addressed/monitored: Atrial Fibrillation, HTN, HLD, and DMII  Care Plan : Medication Management  Updates made by Tristan Mayer, [place] since 11/16/2020 12:00 AM     Problem: AFib, DM, HTN, HLD      Long-Range Goal: Disease Progression Prevention   Recent Progress: On track  Priority: High  Note:   Current Barriers:  Unable to independently afford treatment regimen Unable to achieve control of diabetes   Pharmacist Clinical Goal(s):  Over the next 30 days, patient will achieve control of diabetes  as evidenced by checking blood sugar AM & afternoon, a1c through collaboration with PharmD and provider.   Interventions: 1:1 collaboration with Tristan Marry, MD regarding development and update of comprehensive plan of care as evidenced by provider attestation and co-signature Inter-disciplinary care team collaboration (see longitudinal plan of care) Comprehensive medication review performed; medication list updated in electronic medical record  Diabetes:  Uncontrolled; current treatment:tresiba (Dosing: 52 units Mon, Tues, Thurs night, prior to days patient plays golf, all other nights = 58 units) , metformin 1g BID, glipizide 90m BID; a1c 9.2  Current glucose readings: fasting glucose: 98, most days under 130s post prandial glucose: not currently checking  Reports hypoglycemic symptoms rarely, BG <125s, never below 70. Requested the afternoon checks, pt reports only one check in afternoon, "was in 200s"  Current meal patterns: breakfast: 3 bacon, 2 eggs, toast, or cereal, or oatmeal; lunch: leftovers from dinner, ham or roast beef sandwich; dinner: frozen dinners; snacks: rarely snacks, popcorn, oatmeal cookie; drinks: water, pepsi w/meals, originally 4-5 per day, now down to 1 per day. Discussed decreasing to every other day. Gatorade when golfs  Current exercise: golf, chair exercises  Educated on goals for fasting vs postprandial glucose, Recommended continue current medications, ADD a few BG checks ~2hr after a meal in afternoons several times per week & bring to next PCP appt for review, suspect may need to adjust insulin dose for better glucose control.  Hypertension:  Controlled; current treatment:losartan 109m, coreg 12.580mBID (new);   Current home readings: 120s/70s, HR 50s - lower BP than with previous diltiazem, pt feels sluggish and does not like BP this low.  Denies hypotensive/hypertensive symptoms, states generalized chest discomfort/tightness is still there, advised pt to call for appt with cardiologist for workup.  Educated on BP goal of at least 140/80, and ideally 130/80 if can safely achieve due to concomitant hx of  diabetes Recommended decrease to 1/2 tablet = coreg 6.2563mID, continue to take BP, provide numbers at PCP visit for further discussion  Hyperlipidemia:  Controlled; current treatment:rosuvastatin 34m3mDL 53  Recommended continue current regimen Atrial Fibrillation:  Controlled; current rate/rhythm control diltiazem 120mg30mly; anticoagulant treatment: rivaroxaban 20mg 16my  Recommended continue current medications Assessed patient finances. Patient states he falls into donut hole August through December at which point xarelto & insulin become challenging to afford & obtain, pt gets financial help from his children otherwise would be unable to fill the medication. Will research cost assistance & income eligibility.   Patient Goals/Self-Care Activities Over the next 30 days, patient will:  take medications as prescribed, check glucose daily AM and several times per week in afternoons, document, and provide at future appointments, and collaborate with provider on medication access solutions  Follow Up Plan: Telephone follow up appointment with care management team member scheduled for:  4 months       Medication Assistance:  TBD  Patient's preferred pharmacy is:  [facility] [address] [place] [address]: [phone] [address] [place] [address]: [phone] [address] [address]: [phone] [address]-[address]: [phone] pill box? Yes Pt endorses 100% compliance  Follow Up:  Patient agrees to Care Plan and Follow-up.  Plan:  Telephone follow up appointment with care management team member scheduled for:  4 months  KeeshaDarius Mayer

## 2020-11-23 ENCOUNTER — Other Ambulatory Visit: Payer: Self-pay | Admitting: Family Medicine

## 2020-11-23 DIAGNOSIS — E1165 Type 2 diabetes mellitus with hyperglycemia: Secondary | ICD-10-CM

## 2020-11-23 DIAGNOSIS — IMO0002 Reserved for concepts with insufficient information to code with codable children: Secondary | ICD-10-CM

## 2020-12-06 ENCOUNTER — Other Ambulatory Visit: Payer: Self-pay | Admitting: Family Medicine

## 2020-12-12 ENCOUNTER — Other Ambulatory Visit: Payer: Self-pay | Admitting: Family Medicine

## 2020-12-19 ENCOUNTER — Other Ambulatory Visit: Payer: Self-pay | Admitting: Family Medicine

## 2020-12-19 DIAGNOSIS — E1165 Type 2 diabetes mellitus with hyperglycemia: Secondary | ICD-10-CM

## 2020-12-19 DIAGNOSIS — IMO0002 Reserved for concepts with insufficient information to code with codable children: Secondary | ICD-10-CM

## 2020-12-23 ENCOUNTER — Ambulatory Visit: Payer: Medicare Other | Admitting: Family Medicine

## 2020-12-31 ENCOUNTER — Other Ambulatory Visit: Payer: Self-pay | Admitting: Family Medicine

## 2021-01-03 ENCOUNTER — Other Ambulatory Visit: Payer: Self-pay | Admitting: Family Medicine

## 2021-01-03 DIAGNOSIS — N401 Enlarged prostate with lower urinary tract symptoms: Secondary | ICD-10-CM

## 2021-01-11 ENCOUNTER — Other Ambulatory Visit: Payer: Self-pay | Admitting: Family Medicine

## 2021-01-14 ENCOUNTER — Ambulatory Visit (INDEPENDENT_AMBULATORY_CARE_PROVIDER_SITE_OTHER): Payer: Medicare Other | Admitting: Family Medicine

## 2021-01-14 ENCOUNTER — Encounter: Payer: Self-pay | Admitting: Family Medicine

## 2021-01-14 ENCOUNTER — Other Ambulatory Visit: Payer: Self-pay

## 2021-01-14 VITALS — BP 133/60 | HR 75 | Ht 69.0 in | Wt 250.0 lb

## 2021-01-14 DIAGNOSIS — E1165 Type 2 diabetes mellitus with hyperglycemia: Secondary | ICD-10-CM | POA: Diagnosis not present

## 2021-01-14 DIAGNOSIS — I10 Essential (primary) hypertension: Secondary | ICD-10-CM

## 2021-01-14 DIAGNOSIS — E118 Type 2 diabetes mellitus with unspecified complications: Secondary | ICD-10-CM

## 2021-01-14 DIAGNOSIS — N179 Acute kidney failure, unspecified: Secondary | ICD-10-CM | POA: Diagnosis not present

## 2021-01-14 DIAGNOSIS — IMO0002 Reserved for concepts with insufficient information to code with codable children: Secondary | ICD-10-CM

## 2021-01-14 DIAGNOSIS — N1832 Chronic kidney disease, stage 3b: Secondary | ICD-10-CM

## 2021-01-14 DIAGNOSIS — Z23 Encounter for immunization: Secondary | ICD-10-CM | POA: Diagnosis not present

## 2021-01-14 DIAGNOSIS — Z6835 Body mass index (BMI) 35.0-35.9, adult: Secondary | ICD-10-CM

## 2021-01-14 LAB — POCT GLYCOSYLATED HEMOGLOBIN (HGB A1C): Hemoglobin A1C: 8.4 % — AB (ref 4.0–5.6)

## 2021-01-14 NOTE — Progress Notes (Signed)
Taking 1 tablet of carvediol daily,injecting 52 U Tresiba on the days that he plays golf and 58 U on the days that he's not playing.

## 2021-01-14 NOTE — Assessment & Plan Note (Signed)
Already made some changes cutting back on carbs just encouraged him to continue to work on it.  Again I really like to see him lose about 5 pounds since I last saw him.

## 2021-01-14 NOTE — Assessment & Plan Note (Signed)
1C jumped up to 8.4.  He says in the last month or so he really has made some major changes he is cut out soda and potatoes and says he has been seeing blood sugars between 90 and 130.  So we discussed a couple of options since it sounds like he is committed to making some changes Ragona continue to follow this for another 3 months.  Otherwise I think he would really benefit from semaglutide or similar type product.  Unfortunately he would probably not qualify for patient assistance and this would put him into his Medicare Very quickly.  But I think he would do really well on it.  We did discuss also try to work on losing about 5 pounds between now and when I see him back in 3 months.

## 2021-01-14 NOTE — Progress Notes (Signed)
Established Patient Office Visit  Subjective:  Patient ID: Tristan Mayer, male    DOB: December 31, 1940  Age: 80 y.o. MRN: [id number]  CC:  Chief Complaint  Patient presents with   Diabetes    HPI Tristan Mayer presents for   Hypertension- Pt denies chest pain, SOB, dizziness, or heart palpitations.  Taking meds as directed w/o problems.  Denies medication side effects.  Only taking coreg QD.   Diabetes - no hypoglycemic events. No wounds or sores that are not healing well. No increased thirst or urination. Checking glucose at home. Taking medications as prescribed without any side effects.    Past Medical History:  Diagnosis Date   Atrial fibrillation ([place])    CVA (cerebral vascular accident) ([place])    Diabetes mellitus ([place])    GERD (gastroesophageal reflux disease)    Hyperlipidemia    PUD (peptic ulcer disease)     Past Surgical History:  Procedure Laterality Date   CATARACT EXTRACTION W/ INTRAOCULAR LENS IMPLANT Right     Family History  Problem Relation Age of Onset   Prostate cancer Father 89   Stroke Father    Diabetes Brother    Diabetes Brother    Atrial fibrillation Mother     Social History   Socioeconomic History   Marital status: Married    Spouse name: [name]   Number of children: 3   Years of education: 12th Grade   Highest education level: 12th grade  Occupational History   Occupation: Retired  Tobacco Use   Smoking status: Some Days    Types: Cigars   Smokeless tobacco: Never  Substance and Sexual Activity   Alcohol use: Yes    Comment: Occasional   Drug use: Never   Sexual activity: Not on file  Other Topics Concern   Not on file  Social History Narrative   Lives with his wife. He enjoys playing golf three times a week.   Social Determinants of Health   Financial Resource Strain: Low Risk    Difficulty of Paying Living Expenses: Not hard at all  Food Insecurity: No Food Insecurity   Worried About Charity fundraiser in the Last Year:  Never true   [place] in the Last Year: Never true  Transportation Needs: No Transportation Needs   Lack of Transportation (Medical): No   Lack of Transportation (Non-Medical): No  Physical Activity: Inactive   Days of Exercise per Week: 0 days   Minutes of Exercise per Session: 0 min  Stress: No Stress Concern Present   Feeling of Stress : Not at all  Social Connections: Moderately Isolated   Frequency of Communication with Friends and Family: Once a week   Frequency of Social Gatherings with Friends and Family: Never   Attends Religious Services: More than 4 times per year   Active Member of [employer] or Organizations: No   Attends Music therapist: Never   Marital Status: Married  Human resources officer Violence: Not At Risk   Fear of Current or Ex-Partner: No   Emotionally Abused: No   Physically Abused: No   Sexually Abused: No    Outpatient Medications Prior to Visit  Medication Sig Dispense Refill   acetaminophen (TYLENOL) 650 MG CR tablet Take 1 tablet (650 mg total) by mouth every 8 (eight) hours as needed for pain. 90 tablet 3   Blood Glucose Calibration (ACCU-CHEK GUIDE CONTROL) LIQD      blood glucose meter kit and supplies Dispense based on  patient and insurance preference. Use up to four times daily as directed. Dx: E11.8, E11.65 1 each 0   Blood Glucose Monitoring Suppl (ONETOUCH VERIO REFLECT) w/Device KIT 1 each by Does not apply route 2 (two) times daily. Dx: E11.8, E11.65 - Check fasting blood sugar every morning and once 2 hours after largest meal of the day. 1 kit prn   carvedilol (COREG) 12.5 MG tablet Take 1 tablet (12.5 mg total) by mouth 2 (two) times daily with a meal. (Patient taking differently: Take 12.5 mg by mouth daily.) 60 tablet 3   Co-Enzyme Q-10 30 MG CAPS Take 400 mg by mouth daily.     famotidine (PEPCID) 20 MG tablet Take 20 mg by mouth daily.     finasteride (PROSCAR) 5 MG tablet TAKE 1 TABLET BY MOUTH AT BEDTIME 90 tablet 0    gabapentin (NEURONTIN) 300 MG capsule TAKE 2 CAPSULES BY MOUTH ONCE DAILY IN THE MORNING THEN 3 ONCE DAILY IN THE EVENING 450 capsule 0   glipiZIDE (GLUCOTROL) 10 MG tablet TAKE 1 TABLET BY MOUTH TWICE DAILY BEFORE MEAL(S) 180 tablet 0   glucose blood (ONETOUCH VERIO) test strip Dx: E11.8, E11.65 - Check fasting blood sugar every morning and once 2 hours after largest meal of the day. 100 each 12   Insulin Pen Needle (BD ULTRA-FINE PEN NEEDLES) 29G X 12.7MM MISC USE 1  TWICE DAILY WITH PENS 100 each prn   losartan (COZAAR) 25 MG tablet Take 1 tablet by mouth once daily 90 tablet 0   Melatonin 10 MG TABS Take 1 tablet by mouth daily. As needed.     metFORMIN (GLUCOPHAGE) 1000 MG tablet TAKE 1 TABLET BY MOUTH TWICE DAILY WITH MEALS 180 tablet 0   rivaroxaban (XARELTO) 20 MG TABS tablet TAKE 1 TABLET BY MOUTH ONCE DAILY WITH SUPPER 90 tablet 3   rosuvastatin (CRESTOR) 40 MG tablet Take 1 tablet by mouth once daily 90 tablet 3   sertraline (ZOLOFT) 100 MG tablet Take 1 tablet by mouth once daily 90 tablet 0   tamsulosin (FLOMAX) 0.4 MG CAPS capsule Take 1 capsule by mouth twice daily 180 capsule 0   TRESIBA FLEXTOUCH 100 UNIT/ML FlexTouch Pen INJECT 65 TO 80 UNITS SUBCUTANEOUSLY ONCE DAILY 15 mL 2   Ultra Thin Lancets 31G MISC      No facility-administered medications prior to visit.    Allergies  Allergen Reactions   Statins Other (See Comments)    Other reaction(s): Other All statin drugs Leg cramps when he was on atorvastatin and fenofibrate simultaneously. Discussed with his PCP Dr. Remus Blake on the phone [phone]) All statin drugs Leg cramps when he was on atorvastatin and fenofibrate simultaneously. Discussed with his PCP Dr. Remus Blake on the phone [phone]) All statin drugs    Diltiazem Other (See Comments)    Chest pain   Eliquis [Apixaban] Other (See Comments)    Feels weak    Lisinopril Other (See Comments)    Weakness    ROS Review of Systems    Objective:     Physical Exam Constitutional:      Appearance: Normal appearance. He is well-developed.  HENT:     Head: Normocephalic and atraumatic.  Cardiovascular:     Rate and Rhythm: Normal rate and regular rhythm.     Heart sounds: Normal heart sounds.  Pulmonary:     Effort: Pulmonary effort is normal.     Breath sounds: Normal breath sounds.  Skin:    General: Skin is warm  and dry.  Neurological:     Mental Status: He is alert and oriented to person, place, and time. Mental status is at baseline.  Psychiatric:        Behavior: Behavior normal.    BP 133/60   Pulse 75   Ht $R'5\' 9"'BA$  (1.753 m)   Wt 250 lb (113.4 kg)   SpO2 95%   BMI 36.92 kg/m  Wt Readings from Last 3 Encounters:  01/14/21 250 lb (113.4 kg)  09/21/20 250 lb (113.4 kg)  06/22/20 252 lb (114.3 kg)     Health Maintenance Due  Topic Date Due   Zoster Vaccines- Shingrix (1 of 2) Never done   COVID-19 Vaccine (3 - Booster for Janssen series) 09/15/2020    There are no preventive care reminders to display for this patient.  No results found for: TSH Lab Results  Component Value Date   WBC 7.8 06/22/2020   HGB 13.5 06/22/2020   HCT 39.3 06/22/2020   MCV 86.9 06/22/2020   PLT 190 06/22/2020   Lab Results  Component Value Date   NA 138 06/22/2020   K 4.7 06/22/2020   CO2 25 06/22/2020   GLUCOSE 162 (H) 06/22/2020   BUN 27 (H) 06/22/2020   CREATININE 1.56 (H) 06/22/2020   BILITOT 0.9 06/22/2020   AST 15 06/22/2020   ALT 20 06/22/2020   PROT 6.6 06/22/2020   CALCIUM 9.5 06/22/2020   Lab Results  Component Value Date   CHOL 111 06/22/2020   Lab Results  Component Value Date   HDL 30 (L) 06/22/2020   Lab Results  Component Value Date   LDLCALC 53 06/22/2020   Lab Results  Component Value Date   TRIG 213 (H) 06/22/2020   Lab Results  Component Value Date   CHOLHDL 3.7 06/22/2020   Lab Results  Component Value Date   HGBA1C 8.4 (A) 01/14/2021      Assessment & Plan:   Problem List  Items Addressed This Visit       Cardiovascular and Mediastinum   Benign essential hypertension    Blood pressure looks good today.  Follow-up in 3 months.  Due for BMP.      Relevant Orders   BASIC METABOLIC PANEL WITH GFR     Endocrine   Uncontrolled diabetes mellitus with complication, without long-term current use of insulin ([place]) - Primary    1C jumped up to 8.4.  He says in the last month or so he really has made some major changes he is cut out soda and potatoes and says he has been seeing blood sugars between 90 and 130.  So we discussed a couple of options since it sounds like he is committed to making some changes Ragona continue to follow this for another 3 months.  Otherwise I think he would really benefit from semaglutide or similar type product.  Unfortunately he would probably not qualify for patient assistance and this would put him into his Medicare Very quickly.  But I think he would do really well on it.  We did discuss also try to work on losing about 5 pounds between now and when I see him back in 3 months.      Relevant Orders   POCT glycosylated hemoglobin (Hb A1C) (Completed)   BASIC METABOLIC PANEL WITH GFR     Other   Severe obesity (BMI 35.0-35.9 with comorbidity) ([place])    Already made some changes cutting back on carbs just encouraged him to continue to  work on it.  Again I really like to see him lose about 5 pounds since I last saw him.      Other Visit Diagnoses     Need for immunization against influenza       Relevant Orders   Flu Vaccine QUAD High Dose(Fluad) (Completed)       No orders of the defined types were placed in this encounter.   Follow-up: Return in about 3 months (around 04/15/2021) for Diabetes follow-up.    Beatrice Lecher, MD

## 2021-01-14 NOTE — Assessment & Plan Note (Signed)
Blood pressure looks good today.  Follow-up in 3 months.  Due for BMP.

## 2021-01-15 LAB — BASIC METABOLIC PANEL WITH GFR
BUN/Creatinine Ratio: 15 (calc) (ref 6–22)
BUN: 25 mg/dL (ref 7–25)
CO2: 26 mmol/L (ref 20–32)
Calcium: 9.2 mg/dL (ref 8.6–10.3)
Chloride: 104 mmol/L (ref 98–110)
Creat: 1.62 mg/dL — ABNORMAL HIGH (ref 0.70–1.28)
Glucose, Bld: 164 mg/dL — ABNORMAL HIGH (ref 65–99)
Potassium: 4.6 mmol/L (ref 3.5–5.3)
Sodium: 139 mmol/L (ref 135–146)
eGFR: 43 mL/min/{1.73_m2} — ABNORMAL LOW (ref >=60)

## 2021-01-15 NOTE — Progress Notes (Signed)
Tristan Mayer, your kidney function jumped up to 1.6.  Normally it is around 1.2 but it bumped up about 6 months ago and now it is even higher which is little concerning.  You still look pretty dry on your blood work.  They have been trying to get more fluid thin.  Schedule you for a kidney ultrasound as well as some additional blood work since there has been a change in function if you are okay with this then please let me know.

## 2021-01-19 NOTE — Progress Notes (Signed)
Orders Placed This Encounter     US Renal         Standing Status: Future         Standing Expiration Date: 01/19/2022         Order Specific Question: Reason for Exam (SYMPTOM  OR DIAGNOSIS REQUIRED)         Answer: elevate serum creatinine, CKD that has worsened.         Order Specific Question: Preferred imaging location?         Answer: Product/process development scientist     Flu Vaccine QUAD High Dose(Fluad)     BASIC METABOLIC PANEL WITH GFR     Hepatitis C antibody     HIV Antibody (routine testing w rflx)     Urine Microscopic     Microalbumin / creatinine urine ratio     Protein Electrophoresis, with Total Protein and Reflex to IFE, Urine     Protein electrophoresis, serum     ANA     C-reactive protein     Sedimentation rate     CBC with Differential/Platelet     POCT glycosylated hemoglobin (Hb A1C)

## 2021-01-19 NOTE — Progress Notes (Signed)
Orders Placed This Encounter  Procedures   US Renal    Standing Status:   Future    Standing Expiration Date:   01/19/2022    Order Specific Question:   Reason for Exam (SYMPTOM  OR DIAGNOSIS REQUIRED)    Answer:   elevate serum creatinine, CKD that has worsened.    Order Specific Question:   Preferred imaging location?    Answer:   Product/process development scientist   Flu Vaccine QUAD High Dose(Fluad)   BASIC METABOLIC PANEL WITH GFR   Hepatitis C antibody   HIV Antibody (routine testing w rflx)   Urine Microscopic   Microalbumin / creatinine urine ratio   Protein Electrophoresis, with Total Protein and Reflex to IFE, Urine   Protein electrophoresis, serum   ANA   C-reactive protein   Sedimentation rate   CBC with Differential/Platelet   POCT glycosylated hemoglobin (Hb A1C)

## 2021-01-19 NOTE — Addendum Note (Signed)
Addended by: Beatrice Lecher D on: 01/19/2021 07:54 AM   Modules accepted: Orders

## 2021-01-20 ENCOUNTER — Other Ambulatory Visit: Payer: Self-pay

## 2021-01-20 ENCOUNTER — Ambulatory Visit (INDEPENDENT_AMBULATORY_CARE_PROVIDER_SITE_OTHER): Payer: Medicare Other

## 2021-01-20 DIAGNOSIS — IMO0002 Reserved for concepts with insufficient information to code with codable children: Secondary | ICD-10-CM

## 2021-01-20 DIAGNOSIS — E1122 Type 2 diabetes mellitus with diabetic chronic kidney disease: Secondary | ICD-10-CM

## 2021-01-20 DIAGNOSIS — N1832 Chronic kidney disease, stage 3b: Secondary | ICD-10-CM | POA: Diagnosis not present

## 2021-01-20 DIAGNOSIS — N179 Acute kidney failure, unspecified: Secondary | ICD-10-CM

## 2021-01-20 DIAGNOSIS — I129 Hypertensive chronic kidney disease with stage 1 through stage 4 chronic kidney disease, or unspecified chronic kidney disease: Secondary | ICD-10-CM

## 2021-01-20 DIAGNOSIS — I10 Essential (primary) hypertension: Secondary | ICD-10-CM

## 2021-01-20 DIAGNOSIS — E1165 Type 2 diabetes mellitus with hyperglycemia: Secondary | ICD-10-CM

## 2021-01-20 DIAGNOSIS — N281 Cyst of kidney, acquired: Secondary | ICD-10-CM | POA: Diagnosis not present

## 2021-01-20 DIAGNOSIS — N189 Chronic kidney disease, unspecified: Secondary | ICD-10-CM | POA: Diagnosis not present

## 2021-01-25 NOTE — Progress Notes (Signed)
Hi Craig, I do have a small cyst on your left kidney but is not otherwise concerning or worrisome.  You do have an enlarged prostate gland.  No other worrisome findings which is reassuring.  Please try to get the extra blood work done when you are able to.

## 2021-01-28 ENCOUNTER — Other Ambulatory Visit: Payer: Self-pay | Admitting: *Deleted

## 2021-01-28 MED ORDER — CARVEDILOL 12.5 MG PO TABS: 12.5000 mg | ORAL_TABLET | Freq: Two times a day (BID) | ORAL | 5 refills | Status: DC

## 2021-02-03 ENCOUNTER — Other Ambulatory Visit: Payer: Self-pay | Admitting: Family Medicine

## 2021-02-08 DIAGNOSIS — E118 Type 2 diabetes mellitus with unspecified complications: Secondary | ICD-10-CM | POA: Diagnosis not present

## 2021-02-08 DIAGNOSIS — E1165 Type 2 diabetes mellitus with hyperglycemia: Secondary | ICD-10-CM | POA: Diagnosis not present

## 2021-02-08 DIAGNOSIS — N179 Acute kidney failure, unspecified: Secondary | ICD-10-CM | POA: Diagnosis not present

## 2021-02-08 DIAGNOSIS — I10 Essential (primary) hypertension: Secondary | ICD-10-CM | POA: Diagnosis not present

## 2021-02-09 DIAGNOSIS — Z85828 Personal history of other malignant neoplasm of skin: Secondary | ICD-10-CM | POA: Diagnosis not present

## 2021-02-09 DIAGNOSIS — L821 Other seborrheic keratosis: Secondary | ICD-10-CM | POA: Diagnosis not present

## 2021-02-09 DIAGNOSIS — D1801 Hemangioma of skin and subcutaneous tissue: Secondary | ICD-10-CM | POA: Diagnosis not present

## 2021-02-09 DIAGNOSIS — D485 Neoplasm of uncertain behavior of skin: Secondary | ICD-10-CM | POA: Diagnosis not present

## 2021-02-09 NOTE — Progress Notes (Signed)
Hi Tristan Mayer, negative for hepatitis C and HIV.  Inflammatory markers are negative.  Your blood count is normal.  All the additional special tests are not back yet but hopefully will be back later this week.

## 2021-02-10 DIAGNOSIS — E1165 Type 2 diabetes mellitus with hyperglycemia: Secondary | ICD-10-CM | POA: Diagnosis not present

## 2021-02-10 DIAGNOSIS — I10 Essential (primary) hypertension: Secondary | ICD-10-CM | POA: Diagnosis not present

## 2021-02-10 DIAGNOSIS — N179 Acute kidney failure, unspecified: Secondary | ICD-10-CM | POA: Diagnosis not present

## 2021-02-10 DIAGNOSIS — E118 Type 2 diabetes mellitus with unspecified complications: Secondary | ICD-10-CM | POA: Diagnosis not present

## 2021-02-10 LAB — CBC WITH DIFFERENTIAL/PLATELET
Absolute Monocytes: 511 cells/uL (ref 200–950)
Basophils Absolute: 22 cells/uL (ref 0–200)
Basophils Relative: 0.3 %
Eosinophils Absolute: 80 cells/uL (ref 15–500)
Eosinophils Relative: 1.1 %
HCT: 41.5 % (ref 38.5–50.0)
Hemoglobin: 13.7 g/dL (ref 13.2–17.1)
Lymphs Abs: 1774 cells/uL (ref 850–3900)
MCH: 29.7 pg (ref 27.0–33.0)
MCHC: 33 g/dL (ref 32.0–36.0)
MCV: 89.8 fL (ref 80.0–100.0)
MPV: 12.8 fL — ABNORMAL HIGH (ref 7.5–12.5)
Monocytes Relative: 7 %
Neutro Abs: 4913 cells/uL (ref 1500–7800)
Neutrophils Relative %: 67.3 %
Platelets: 165 10*3/uL (ref 140–400)
RBC: 4.62 10*6/uL (ref 4.20–5.80)
RDW: 12.7 % (ref 11.0–15.0)
Total Lymphocyte: 24.3 %
WBC: 7.3 10*3/uL (ref 3.8–10.8)

## 2021-02-10 LAB — PROTEIN ELECTROPHORESIS, SERUM
Albumin ELP: 3.6 g/dL — ABNORMAL LOW (ref 3.8–4.8)
Alpha 1: 0.3 g/dL (ref 0.2–0.3)
Alpha 2: 0.8 g/dL (ref 0.5–0.9)
Beta 2: 0.4 g/dL (ref 0.2–0.5)
Beta Globulin: 0.4 g/dL (ref 0.4–0.6)
Gamma Globulin: 0.7 g/dL — ABNORMAL LOW (ref 0.8–1.7)
Total Protein: 6.2 g/dL (ref 6.1–8.1)

## 2021-02-10 LAB — HIV ANTIBODY (ROUTINE TESTING W REFLEX): HIV 1&2 Ab, 4th Generation: NONREACTIVE

## 2021-02-10 LAB — C-REACTIVE PROTEIN: CRP: 0.7 mg/L (ref <8.0)

## 2021-02-10 LAB — SEDIMENTATION RATE: Sed Rate: 11 mm/h (ref 0–20)

## 2021-02-10 LAB — HEPATITIS C ANTIBODY
Hepatitis C Ab: NONREACTIVE
SIGNAL TO CUT-OFF: 0.01 (ref <1.00)

## 2021-02-10 LAB — ANA: Anti Nuclear Antibody (ANA): NEGATIVE

## 2021-02-12 ENCOUNTER — Encounter: Payer: Self-pay | Admitting: Emergency Medicine

## 2021-02-12 ENCOUNTER — Emergency Department
Admission: EM | Admit: 2021-02-12 | Discharge: 2021-02-12 | Disposition: A | Discharge status: Home / Self Care | Payer: Medicare Other | Source: Home / Self Care | Attending: Family Medicine | Admitting: Family Medicine

## 2021-02-12 ENCOUNTER — Other Ambulatory Visit: Payer: Self-pay

## 2021-02-12 ENCOUNTER — Other Ambulatory Visit: Payer: Self-pay | Admitting: Family Medicine

## 2021-02-12 ENCOUNTER — Emergency Department (INDEPENDENT_AMBULATORY_CARE_PROVIDER_SITE_OTHER): Payer: Medicare Other

## 2021-02-12 DIAGNOSIS — S4991XA Unspecified injury of right shoulder and upper arm, initial encounter: Secondary | ICD-10-CM | POA: Diagnosis not present

## 2021-02-12 DIAGNOSIS — E118 Type 2 diabetes mellitus with unspecified complications: Secondary | ICD-10-CM | POA: Diagnosis not present

## 2021-02-12 DIAGNOSIS — N1832 Chronic kidney disease, stage 3b: Secondary | ICD-10-CM | POA: Diagnosis not present

## 2021-02-12 DIAGNOSIS — E1165 Type 2 diabetes mellitus with hyperglycemia: Secondary | ICD-10-CM | POA: Diagnosis not present

## 2021-02-12 DIAGNOSIS — M25511 Pain in right shoulder: Secondary | ICD-10-CM | POA: Diagnosis not present

## 2021-02-12 DIAGNOSIS — I10 Essential (primary) hypertension: Secondary | ICD-10-CM | POA: Diagnosis not present

## 2021-02-12 NOTE — ED Triage Notes (Signed)
Fell from a golf cart yesterday injuring RT shoulder, hit wooden bridge. Took Tylenol.

## 2021-02-12 NOTE — Discharge Instructions (Addendum)
Wear sling.  Apply ice pack for 20 to 30 minutes, 3 to 4 times daily  Continue until pain and swelling decrease.  May take Tylenol as needed for pain.  Begin range of motion and stretching exercises as tolerated.

## 2021-02-12 NOTE — ED Provider Notes (Signed)
Tristan Mayer CARE    CSN: [id number] Arrival date & time: 02/12/21  1354      History   Chief Complaint Chief Complaint  Patient presents with   Fall    HPI Tristan Mayer is a 80 y.o. male.   Patient reports that he fell from a golf cart yesterday, landing on his right shoulder.  Since then he has had pain with any movement and limited range of motion.  The history is provided by the patient.  Shoulder Injury This is a new problem. The current episode started yesterday. The problem occurs constantly. The problem has not changed since onset.Pertinent negatives include no chest pain and no shortness of breath. Exacerbated by: right shoulder movment. Nothing relieves the symptoms. He has tried acetaminophen for the symptoms. The treatment provided mild relief.   Past Medical History:  Diagnosis Date   Atrial fibrillation ([place])    CVA (cerebral vascular accident) ([place])    Diabetes mellitus ([place])    GERD (gastroesophageal reflux disease)    Hyperlipidemia    PUD (peptic ulcer disease)     Patient Active Problem List   Diagnosis Date Noted   Idiopathic peripheral neuropathy 09/16/2019   BMI 36.0-36.9,adult 03/26/2019   Severe obesity (BMI 35.0-35.9 with comorbidity) ([place]) 08/22/2018   Morbid obesity ([place]) 05/23/2018   Aortic atherosclerosis ([place]) 03/12/2018   Primary osteoarthritis of both knees 01/03/2018   BPH (benign prostatic hyperplasia) 07/10/2017   INO (internuclear ophthalmoplegia), left 07/03/2017   Hearing loss 07/03/2017   ED (erectile dysfunction) 07/03/2017   Paroxysmal A-fib ([place]) 12/12/2016   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 ([place]) 12/09/2016   Thrombsis of left atrial appendage without antecedent myocardial infarction 12/09/2016   Uncontrolled diabetes mellitus with complication, without long-term current use of insulin 08/14/2008   Other and unspecified hyperlipidemia 05/20/2008   Sleep apnea  07/19/2007   Allergic rhinitis 01/07/2007   Benign essential hypertension 01/07/2007   Gout, unspecified 01/07/2007    Past Surgical History:  Procedure Laterality Date   CATARACT EXTRACTION W/ INTRAOCULAR LENS IMPLANT Right        Home Medications    Prior to Admission medications   Medication Sig Start Date End Date Taking? Authorizing Provider  acetaminophen (TYLENOL) 650 MG CR tablet Take 1 tablet (650 mg total) by mouth every 8 (eight) hours as needed for pain. 06/23/20   Silverio Decamp, MD  Blood Glucose Calibration (ACCU-CHEK GUIDE CONTROL) LIQD  08/13/18   [provider]  blood glucose meter kit and supplies Dispense based on patient and insurance preference. Use up to four times daily as directed. Dx: E11.8, E11.65 06/13/19   Hali Marry, MD  Blood Glucose Monitoring Suppl [facility] VERIO REFLECT) w/Device KIT 1 each by Does not apply route 2 (two) times daily. Dx: E11.8, E11.65 - Check fasting blood sugar every morning and once 2 hours after largest meal of the day. 06/14/19   Hali Marry, MD  carvedilol (COREG) 12.5 MG tablet Take 1 tablet (12.5 mg total) by mouth 2 (two) times daily with a meal. 01/28/21   Hali Marry, MD  Co-Enzyme Q-10 30 MG CAPS Take 400 mg by mouth daily.    [provider]  famotidine (PEPCID) 20 MG tablet Take 20 mg by mouth daily.    [provider]  finasteride (PROSCAR) 5 MG tablet TAKE 1 TABLET BY MOUTH AT BEDTIME 01/05/21   Hali Marry, MD  gabapentin (NEURONTIN) 300 MG capsule TAKE  2 CAPSULES BY MOUTH ONCE DAILY IN THE MORNING THEN 3 ONCE DAILY IN THE EVENING 11/24/20   Hali Marry, MD  glipiZIDE (GLUCOTROL) 10 MG tablet TAKE 1 TABLET BY MOUTH TWICE DAILY BEFORE MEAL(S) 01/12/21   Hali Marry, MD  glucose blood (ONETOUCH VERIO) test strip Dx: E11.8, E11.65 - Check fasting blood sugar every morning and once 2 hours after largest meal of the day. 06/14/19    Hali Marry, MD  Insulin Pen Needle (BD ULTRA-FINE PEN NEEDLES) 29G X 12.7MM MISC USE 1  TWICE DAILY WITH PENS 12/18/19   Hali Marry, MD  losartan (COZAAR) 25 MG tablet Take 1 tablet by mouth once daily 11/03/20   Hali Marry, MD  Melatonin 10 MG TABS Take 1 tablet by mouth daily. As needed.    [provider]  metFORMIN (GLUCOPHAGE) 1000 MG tablet TAKE 1 TABLET BY MOUTH TWICE DAILY WITH MEALS 12/22/20   Hali Marry, MD  rosuvastatin (CRESTOR) 40 MG tablet Take 1 tablet by mouth once daily 07/08/20   Hali Marry, MD  sertraline (ZOLOFT) 100 MG tablet Take 1 tablet by mouth once daily 12/14/20   Hali Marry, MD  tamsulosin [facility]) 0.4 MG CAPS capsule Take 1 capsule by mouth twice daily 12/22/20   Hali Marry, MD  TRESIBA FLEXTOUCH 100 UNIT/ML FlexTouch Pen INJECT 65 TO 80 UNITS SUBCUTANEOUSLY ONCE DAILY 01/01/21   Hali Marry, MD  Ultra Thin Lancets 31G [place]  08/13/18   [provider]  XARELTO 20 MG TABS tablet TAKE 1 TABLET BY MOUTH ONCE DAILY WITH SUPPER 02/04/21   Hali Marry, MD    Family History Family History  Problem Relation Age of Onset   Prostate cancer Father 12   Stroke Father    Diabetes Brother    Diabetes Brother    Atrial fibrillation Mother     Social History Social History   Tobacco Use   Smoking status: Some Days    Types: Cigars   Smokeless tobacco: Never  Substance Use Topics   Alcohol use: Yes    Comment: Occasional   Drug use: Never     Allergies   Statins, Diltiazem, Eliquis [apixaban], and Lisinopril   Review of Systems Review of Systems  Constitutional: Negative.   HENT: Negative.    Eyes: Negative.   Respiratory:  Negative for chest tightness and shortness of breath.   Cardiovascular:  Negative for chest pain.  Gastrointestinal: Negative.   Genitourinary: Negative.   Musculoskeletal:        Right shoulder pain  Skin: Negative.    Neurological:  Negative for dizziness.    Physical Exam Triage Vital Signs ED Triage Vitals  Enc Vitals Group     BP 02/12/21 1408 (!) 151/69     Pulse Rate 02/12/21 1408 (!) 54     Resp 02/12/21 1408 18     Temp 02/12/21 1408 98.3 F (36.8 C)     Temp Source 02/12/21 1408 Oral     SpO2 02/12/21 1408 97 %     Weight 02/12/21 1410 250 lb (113.4 kg)     Height 02/12/21 1410 5' 10" (1.778 m)     Head Circumference --      Peak Flow --      Pain Score 02/12/21 1409 9     Pain Loc --      Pain Edu? --      Excl. in [place]? --  No data found.  Updated Vital Signs BP (!) 151/69   Pulse (!) 54   Temp 98.3 F (36.8 C) (Oral)   Resp 18   Ht 5' 10" (1.778 m)   Wt 113.4 kg   SpO2 97%   BMI 35.87 kg/m   Visual Acuity Right Eye Distance:   Left Eye Distance:   Bilateral Distance:    Right Eye Near:   Left Eye Near:    Bilateral Near:     Physical Exam Vitals and nursing note reviewed.  Constitutional:      General: He is not in acute distress.    Appearance: He is obese.  HENT:     Head: Atraumatic.  Eyes:     Pupils: Pupils are equal, round, and reactive to light.  Cardiovascular:     Rate and Rhythm: Bradycardia present.  Pulmonary:     Effort: Pulmonary effort is normal.  Musculoskeletal:     Right shoulder: Tenderness and bony tenderness present. No swelling, deformity, laceration or crepitus. Decreased range of motion. Decreased strength. Normal pulse.     Cervical back: Normal range of motion.     Comments: Right shoulder has severely limited range of motion; unable to abduct more than about 20 degrees from vertical.  Passive range of motion impaired also.  Unable to perform Apley's and empty can tests.  Internal/external rotation strength relatively preserved.  Distal neurovascular function is intact.  There is mild tenderness to palpation over the right AC joint.  Skin:    General: Skin is warm and dry.  Neurological:     Mental Status: He is alert.      UC Treatments / Results  Labs (all labs ordered are listed, but only abnormal results are displayed) Labs Reviewed - No data to display  EKG   Radiology DG Shoulder Right  Result Date: 02/12/2021 CLINICAL DATA:  Pain from a fall. EXAM: RIGHT SHOULDER - 2+ VIEW COMPARISON:  None. FINDINGS: There is no evidence of fracture or dislocation. There is no evidence of arthropathy or other focal bone abnormality. Soft tissues are unremarkable. IMPRESSION: Negative. Electronically Signed   By: Eric  Mansell M.D.   On: 02/12/2021 15:20    Procedures Procedures (including critical care time)  Medications Ordered in UC Medications - No data to display  Initial Impression / Assessment and Plan / UC Course  I have reviewed the triage vital signs and the nursing notes.  Pertinent labs & imaging results that were available during my care of the patient were reviewed by me and considered in my medical decision making (see chart for details).    Suspect rotator cuff injury. Dispensed sling. Followup with Dr. [facility]kand[facility] ([facility] for further evaluation/treatment.  Final Clinical Impressions(s) / UC Diagnoses   Final diagnoses:  Injury of right shoulder, initial encounter     Discharge Instructions      Wear sling.  Apply ice pack for 20 to 30 minutes, 3 to 4 times daily  Continue until pain and swelling decrease.  May take Tylenol as needed for pain.  Begin range of motion and stretching exercises as tolerated.   ED Prescriptions   None       Beese, Stephen A, MD 02/14/21 1100  

## 2021-02-13 LAB — MICROALBUMIN / CREATININE URINE RATIO
Creatinine, Urine: 130 mg/dL (ref 20–320)
Microalb Creat Ratio: 178 mcg/mg creat — ABNORMAL HIGH (ref <30)
Microalb, Ur: 23.2 mg/dL

## 2021-02-13 LAB — URINALYSIS, MICROSCOPIC ONLY
Bacteria, UA: NONE SEEN /HPF
Hyaline Cast: NONE SEEN /LPF
Squamous Epithelial / HPF: NONE SEEN /HPF (ref <=5)

## 2021-02-15 LAB — PROTEIN ELECTROPHORESIS,W/TOTAL PROTEIN AND RFLX TO IFE,URINE
Albumin: 52 %
Alpha-1-Globulin, U: 2 %
Alpha-2-Globulin, U: 18 %
Beta Globulin, U: 14 %
Creatinine, 24H Ur: 1.28 g/(24.h) (ref 0.50–2.15)
Gamma Globulin, U: 15 %
PROTEIN/CREATININE RATIO: 0.531 mg/mg creat — ABNORMAL HIGH (ref <0.100)
PROTEIN/CREATININE RATIO: 531 mg/g creat — ABNORMAL HIGH (ref <100)
Protein, 24H Urine: 680 mg/24 h — ABNORMAL HIGH (ref 0–149)

## 2021-02-15 LAB — IMMUNOFIXATION INTE

## 2021-02-16 DIAGNOSIS — N1832 Chronic kidney disease, stage 3b: Secondary | ICD-10-CM

## 2021-02-16 DIAGNOSIS — N179 Acute kidney failure, unspecified: Secondary | ICD-10-CM

## 2021-02-16 NOTE — Progress Notes (Signed)
Hi Tristan Mayer, you are spilling a little bit more protein in your urine than you were 2 years ago.  Its not greater than 300 which is when we get more concerned.  The urinalysis itself looked okay no sign of extra inflammation or bacteria which is great.  I would like to go ahead and refer you to a nephrologist which is a kidney specialist if you are okay with that please let me know and also let us know if you have a preference.

## 2021-02-16 NOTE — Progress Notes (Signed)
Hi Cecilie Lowers, there are 2 notes.  Some of the urine labs came back and a separate encounter.  So please be aware there are 2 result notes.  No indication of multiple myeloma causing problems.

## 2021-02-17 ENCOUNTER — Other Ambulatory Visit: Payer: Self-pay | Admitting: Family Medicine

## 2021-02-17 ENCOUNTER — Other Ambulatory Visit: Payer: Self-pay | Admitting: *Deleted

## 2021-02-17 DIAGNOSIS — E1169 Type 2 diabetes mellitus with other specified complication: Secondary | ICD-10-CM

## 2021-02-17 DIAGNOSIS — E669 Obesity, unspecified: Secondary | ICD-10-CM

## 2021-02-17 MED ORDER — ONETOUCH VERIO VI STRP: ORAL_STRIP | 12 refills | Status: DC

## 2021-02-18 ENCOUNTER — Other Ambulatory Visit: Payer: Self-pay | Admitting: Family Medicine

## 2021-02-19 ENCOUNTER — Encounter: Payer: Self-pay | Admitting: Family Medicine

## 2021-02-19 NOTE — Telephone Encounter (Signed)
We may need to clarify what he is talking about I did not add anything new.  It looks like the last blood pressure prescription we sent for him was losartan.  But he has been on that for over a year and if he has not been then I was not aware he was not actually taking it.  And he needs to be on it because of his history of diabetes.  May be he is talking about something else I am just not sure.

## 2021-02-19 NOTE — Telephone Encounter (Signed)
I called patient. He states he is not home at the moment and will call back with the name of the medication.

## 2021-02-22 NOTE — Telephone Encounter (Signed)
Okay, I would like him to go ahead and start the losartan it helps protect his kidneys especially since he is spilling some protein in the urine.  This is usually the treatment that we put people on.  Especially while were waiting to get him in with a kidney specialist.  So he can go ahead and start it that would be great.  He has been on it for about 10 days I like to recheck a BMP.  Or if he is following up with nephrology then they can check it.

## 2021-02-23 NOTE — Telephone Encounter (Signed)
Left message with recommendation and a return call.

## 2021-03-03 DIAGNOSIS — E1122 Type 2 diabetes mellitus with diabetic chronic kidney disease: Secondary | ICD-10-CM | POA: Diagnosis not present

## 2021-03-03 DIAGNOSIS — N183 Chronic kidney disease, stage 3 unspecified: Secondary | ICD-10-CM | POA: Diagnosis not present

## 2021-03-03 DIAGNOSIS — N1831 Chronic kidney disease, stage 3a: Secondary | ICD-10-CM | POA: Diagnosis not present

## 2021-03-08 ENCOUNTER — Other Ambulatory Visit: Payer: Self-pay | Admitting: Family Medicine

## 2021-03-10 ENCOUNTER — Other Ambulatory Visit: Payer: Self-pay | Admitting: *Deleted

## 2021-03-10 MED ORDER — TRESIBA FLEXTOUCH 100 UNIT/ML ~~LOC~~ SOPN: PEN_INJECTOR | SUBCUTANEOUS | 2 refills | Status: DC

## 2021-03-15 ENCOUNTER — Telehealth: Payer: Medicare Other

## 2021-03-15 ENCOUNTER — Telehealth: Payer: Self-pay | Admitting: Pharmacist

## 2021-03-15 NOTE — Progress Notes (Deleted)
Seeing cardiologist? Afib? Choice of coreg vs metop? Taking lisinopril? Checking BP?  Chronic Care Management Pharmacy Note  03/15/2021 Name:  Tristan Mayer MRN:  [id number] DOB:  [birth year]  Summary: addressed HTN, HLD, DM  Recommendations/Changes made from today's visit: Decrease coreg to 1/2 tablet (= 6.49m BID) related to pt concerns of BP running too low. Reinforced goal BP, advised that 120/70s is well-controlled, but should not feel poorly or have adverse symptoms.Pt reported "very sluggish, barely able to complete last 3 holes of golf." Counseled that beta blockers do have a fatigue associated that should overcome within 2-3 weeks.Advised pt to make appt with cardiologist for non-specific chest tightness that is not resolved by switch from diltiazem to coreg. Advised seek ER if acute chest pain. Advised to discuss more w/PCP at upcoming visit.  Plan: f/u with pharmacist in 4 months  Subjective: Tristan Bentsonis an 80y.o. year old male who is a primary patient of Metheney, CRene Kocher MD.  The CCM team was consulted for assistance with disease management and care coordination needs.    Engaged with patient by telephone for follow up visit in response to provider referral for pharmacy case management and/or care coordination services.   Consent to Services:  The patient was given information about Chronic Care Management services, agreed to services, and gave verbal consent prior to initiation of services.  Please see initial visit note for detailed documentation.   Patient Care Team: MHali Marry MD as PCP - General (Family Medicine) KDarius Bump [facility] (Pharmacist)  Objective:  Lab Results  Component Value Date   CREATININE 1.62 (H) 01/14/2021   CREATININE 1.56 (H) 06/22/2020   CREATININE 1.21 (H) 12/16/2019    Lab Results  Component Value Date   HGBA1C 8.4 (A) 01/14/2021   Last diabetic Eye exam:  Lab Results  Component Value Date/Time    HMDIABEYEEXA No Retinopathy 04/19/2019 12:00 AM    Last diabetic Foot exam: No results found for: HMDIABFOOTEX      Component Value Date/Time   CHOL 111 06/22/2020 1026   TRIG 213 (H) 06/22/2020 1026   HDL 30 (L) 06/22/2020 1026   CHOLHDL 3.7 06/22/2020 1026   LDLCALC 53 06/22/2020 1026    Hepatic Function Latest Ref Rng & Units 02/08/2021 06/22/2020 05/15/2019  Total Protein 6.1 - 8.1 g/dL 6.2 6.6 6.8  AST 10 - 35 U/L - 15 21  ALT 9 - 46 U/L - 20 18  Total Bilirubin 0.2 - 1.2 mg/dL - 0.9 1.1    No results found for: TSH, FREET4  CBC Latest Ref Rng & Units 02/08/2021 06/22/2020 09/20/2017  WBC 3.8 - 10.8 Thousand/uL 7.3 7.8 6.6  Hemoglobin 13.2 - 17.1 g/dL 13.7 13.5 14.1  Hematocrit 38.5 - 50.0 % 41.5 39.3 40.9  Platelets 140 - 400 Thousand/uL 165 190 179     Social History   Tobacco Use  Smoking Status Some Days   Types: Cigars  Smokeless Tobacco Never   BP Readings from Last 3 Encounters:  02/12/21 (!) 151/69  01/14/21 133/60  09/21/20 (!) 134/54   Pulse Readings from Last 3 Encounters:  02/12/21 (!) 54  01/14/21 75  09/21/20 73   Wt Readings from Last 3 Encounters:  02/12/21 250 lb (113.4 kg)  01/14/21 250 lb (113.4 kg)  09/21/20 250 lb (113.4 kg)    Assessment: Review of patient past medical history, allergies, medications, health status, including review of consultants reports, laboratory and other test data, was performed  as part of comprehensive evaluation and provision of chronic care management services.   SDOH:  (Social Determinants of Health) assessments and interventions performed:    CCM Care Plan  Allergies  Allergen Reactions   Statins Other (See Comments)    Other reaction(s): Other All statin drugs Leg cramps when he was on atorvastatin and fenofibrate simultaneously. Discussed with his PCP Dr. Remus Blake on the phone [phone]) All statin drugs Leg cramps when he was on atorvastatin and fenofibrate simultaneously. Discussed with his PCP  Dr. Remus Blake on the phone [phone]) All statin drugs    Diltiazem Other (See Comments)    Chest pain   Eliquis [Apixaban] Other (See Comments)    Feels weak    Lisinopril Other (See Comments)    Weakness    Medications Reviewed Today     Reviewed by Rene Kocher, CMA (Certified Medical Assistant) on 02/12/21 at 1413  Med List Status: <None>   Medication Order Taking? Sig Documenting Provider Last Dose Status Informant  acetaminophen (TYLENOL) 650 MG CR tablet [id number]  Take 1 tablet (650 mg total) by mouth every 8 (eight) hours as needed for pain. Silverio Decamp, MD  Active   Blood Glucose Calibration (ACCU-CHEK GUIDE CONTROL) Yehuda Budd [id number]   [provider]  Active   blood glucose meter kit and supplies [id number]  Dispense based on patient and insurance preference. Use up to four times daily as directed. Dx: E11.8, E11.65 Hali Marry, MD  Active   Blood Glucose Monitoring Suppl [facility] VERIO REFLECT) w/Device KIT [id number]  1 each by Does not apply route 2 (two) times daily. Dx: E11.8, E11.65 - Check fasting blood sugar every morning and once 2 hours after largest meal of the day. Hali Marry, MD  Active   carvedilol (COREG) 12.5 MG tablet [id number]  Take 1 tablet (12.5 mg total) by mouth 2 (two) times daily with a meal. Hali Marry, MD  Active   Co-Enzyme Q-10 30 MG CAPS [id number]  Take 400 mg by mouth daily. [provider]  Active   famotidine (PEPCID) 20 MG tablet [id number]  Take 20 mg by mouth daily. [provider]  Active   finasteride (PROSCAR) 5 MG tablet [id number]  TAKE 1 TABLET BY MOUTH AT BEDTIME Hali Marry, MD  Active   gabapentin (NEURONTIN) 300 MG capsule [id number]  TAKE 2 CAPSULES BY MOUTH ONCE DAILY IN THE MORNING THEN 3 ONCE DAILY IN THE Erskine Emery, MD  Active   glipiZIDE (GLUCOTROL) 10 MG tablet [id number]  TAKE 1 TABLET BY MOUTH TWICE DAILY BEFORE MEAL(S) Hali Marry, MD  Active   glucose blood (ONETOUCH VERIO) test strip [id number]  Dx: E11.8, E11.65 - Check fasting blood sugar every morning and once 2 hours after largest meal of the day. Hali Marry, MD  Active   Insulin Pen Needle (BD ULTRA-FINE PEN NEEDLES) 29G X 12.7MM MISC [id number]  USE 1  TWICE DAILY WITH PENS Hali Marry, MD  Active   losartan (COZAAR) 25 MG tablet [id number]  Take 1 tablet by mouth once daily Hali Marry, MD  Active   Melatonin 10 MG TABS [id number]  Take 1 tablet by mouth daily. As needed. [provider]  Active   metFORMIN (GLUCOPHAGE) 1000 MG tablet [id number]  TAKE 1 TABLET BY MOUTH TWICE DAILY WITH MEALS Hali Marry, MD  Active   rosuvastatin (CRESTOR) 40 MG tablet [id number]  Take 1 tablet by mouth once daily  Hali Marry, MD  Active   sertraline (ZOLOFT) 100 MG tablet [id number]  Take 1 tablet by mouth once daily Hali Marry, MD  Active   tamsulosin [facility]) 0.4 MG CAPS capsule [id number]  Take 1 capsule by mouth twice daily Hali Marry, MD  Active   TRESIBA FLEXTOUCH 100 UNIT/ML FlexTouch Pen [id number]  INJECT 65 TO 32 UNITS SUBCUTANEOUSLY ONCE DAILY Hali Marry, MD  Active   Ultra Thin Lancets 31G [place] [id number]   [provider]  Active   XARELTO 20 MG TABS tablet [id number]  TAKE 1 TABLET BY MOUTH ONCE DAILY WITH SUPPER Hali Marry, MD  Active             Patient Active Problem List   Diagnosis Date Noted   Idiopathic peripheral neuropathy 09/16/2019   BMI 36.0-36.9,adult 03/26/2019   Severe obesity (BMI 35.0-35.9 with comorbidity) ([place]) 08/22/2018   Morbid obesity ([place]) 05/23/2018   Aortic atherosclerosis ([place]) 03/12/2018   Primary osteoarthritis of both knees 01/03/2018   BPH (benign prostatic hyperplasia) 07/10/2017   INO (internuclear ophthalmoplegia), left 07/03/2017   Hearing loss 07/03/2017   ED (erectile dysfunction) 07/03/2017   Paroxysmal  A-fib ([place]) 12/12/2016   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 ([place]) 12/09/2016   Thrombsis of left atrial appendage without antecedent myocardial infarction 12/09/2016   Uncontrolled diabetes mellitus with complication, without long-term current use of insulin 08/14/2008   Other and unspecified hyperlipidemia 05/20/2008   Sleep apnea 07/19/2007   Allergic rhinitis 01/07/2007   Benign essential hypertension 01/07/2007   Gout, unspecified 01/07/2007    Immunization History  Administered Date(s) Administered   Fluad Quad(high Dose 65+) 02/25/2019, 01/14/2021   Influenza, High Dose Seasonal PF 02/18/2015, 02/06/2017, 01/12/2018, 03/03/2020   Influenza, Seasonal, Injecte, Preservative Fre 02/08/2016   Influenza-Unspecified 02/08/2016, 03/02/2017   Janssen (J&J) SARS-COV-2 Vaccination 07/13/2019, 05/18/2020   PPD Test 03/02/2017   Pneumococcal Conjugate-13 01/19/2016   Pneumococcal Polysaccharide-23 09/30/2000, 02/28/2013   Tdap 05/02/2009    Conditions to be addressed/monitored: Atrial Fibrillation, HTN, HLD, and DMII  There are no care plans that you recently modified to display for this patient.   Medication Assistance:  TBD  Patient's preferred pharmacy is:  [place], [place] [address] [place] [address] Phone: [phone] Fax: [place], [place] - [place] [address] [place] [address] Phone: [phone] Fax: [phone]  [place], [place] [address] Phone: [phone] Fax: [place], [place] [address] Phone: [phone] Fax: [phone]  Uses pill box? Yes Pt endorses 100% compliance  Follow Up:  Patient  agrees to Care Plan and Follow-up.  Plan: Telephone follow up appointment with care management team member scheduled for:  4 months  Darius Bump

## 2021-03-16 NOTE — Telephone Encounter (Signed)
Unsuccessful attempt x2 to contact patient for follow-up phone call for CCM services with pharmacist.  Will route to schedule team for reschedule.  Darius Bump

## 2021-03-17 NOTE — Chronic Care Management (AMB) (Signed)
  Care Management   Note  03/17/2021 Name: Tristan Mayer MRN: [id number] DOB: 1940-05-27  Tristan Mayer is a 80 y.o. year old male who is a primary care patient of Hali Marry, MD and is actively engaged with the care management team. I reached out to Tawni Levy by phone today to assist with re-scheduling a follow up visit with the Pharmacist  Follow up plan: Telephone appointment with care management team member scheduled for: 03/22/2021  Julian Hy, [employer], [place] Management  Direct Dial: [phone]

## 2021-03-22 ENCOUNTER — Telehealth: Payer: Self-pay | Admitting: Pharmacist

## 2021-03-22 ENCOUNTER — Telehealth: Payer: Medicare Other

## 2021-03-22 NOTE — Telephone Encounter (Signed)
Unsuccessful attempt x2 to contact patient for follow-up phone call for CCM services with pharmacist.  Will route to schedule team for reschedule.  Cala Kruckenberg, PharmD Clinical Pharmacist [facility] [phone]   

## 2021-03-22 NOTE — Progress Notes (Deleted)
Seeing cardiologist? Afib? Choice of coreg vs metop? Checking BP?  Chronic Care Management Pharmacy Note  03/22/2021 Name:  Tristan Mayer MRN:  [id number] DOB:  1940/07/22  Summary: addressed HTN, HLD, DM  Recommendations/Changes made from today's visit: Decrease coreg to 1/2 tablet (= 6.56m BID) related to pt concerns of BP running too low. Reinforced goal BP, advised that 120/70s is well-controlled, but should not feel poorly or have adverse symptoms.Pt reported "very sluggish, barely able to complete last 3 holes of golf." Counseled that beta blockers do have a fatigue associated that should overcome within 2-3 weeks.Advised pt to make appt with cardiologist for non-specific chest tightness that is not resolved by switch from diltiazem to coreg. Advised seek ER if acute chest pain. Advised to discuss more w/PCP at upcoming visit.  Plan: f/u with pharmacist in 4 months  Subjective: Tristan Kestleris an 78y.o. 80 year old male who is a primary patient of Metheney, CRene Kocher MD.  The CCM team was consulted for assistance with disease management and care coordination needs.    Engaged with patient by telephone for follow up visit in response to provider referral for pharmacy case management and/or care coordination services.   Consent to Services:  The patient was given information about Chronic Care Management services, agreed to services, and gave verbal consent prior to initiation of services.  Please see initial visit note for detailed documentation.   Patient Care Team: MHali Marry MD as PCP - General (Family Medicine) KDarius Bump [facility] Pharmac[facility] (Pharmacist)  Objective:  Lab Results  Component Value Date   CREATININE 1.62 (H) 01/14/2021   CREATININE 1.56 (H) 06/22/2020   CREATININE 1.21 (H) 12/16/2019    Lab Results  Component Value Date   HGBA1C 8.4 (A) 01/14/2021   Last diabetic Eye exam:  Lab Results  Component Value Date/Time   HMDIABEYEEXA No  Retinopathy 04/19/2019 12:00 AM    Last diabetic Foot exam: No results found for: HMDIABFOOTEX      Component Value Date/Time   CHOL 111 06/22/2020 1026   TRIG 213 (H) 06/22/2020 1026   HDL 30 (L) 06/22/2020 1026   CHOLHDL 3.7 06/22/2020 1026   LDLCALC 53 06/22/2020 1026    Hepatic Function Latest Ref Rng & Units 02/08/2021 06/22/2020 05/15/2019  Total Protein 6.1 - 8.1 g/dL 6.2 6.6 6.8  AST 10 - 35 U/L - 15 21  ALT 9 - 46 U/L - 20 18  Total Bilirubin 0.2 - 1.2 mg/dL - 0.9 1.1    No results found for: TSH, FREET4  CBC Latest Ref Rng & Units 02/08/2021 06/22/2020 09/20/2017  WBC 3.8 - 10.8 Thousand/uL 7.3 7.8 6.6  Hemoglobin 13.2 - 17.1 g/dL 13.7 13.5 14.1  Hematocrit 38.5 - 50.0 % 41.5 39.3 40.9  Platelets 140 - 400 Thousand/uL 165 190 179     Social History   Tobacco Use  Smoking Status Some Days   Types: Cigars  Smokeless Tobacco Never   BP Readings from Last 3 Encounters:  02/12/21 (!) 151/69  01/14/21 133/60  09/21/20 (!) 134/54   Pulse Readings from Last 3 Encounters:  02/12/21 (!) 54  01/14/21 75  09/21/20 73   Wt Readings from Last 3 Encounters:  02/12/21 250 lb (113.4 kg)  01/14/21 250 lb (113.4 kg)  09/21/20 250 lb (113.4 kg)    Assessment: Review of patient past medical history, allergies, medications, health status, including review of consultants reports, laboratory and other test data, was performed as part  of comprehensive evaluation and provision of chronic care management services.   SDOH:  (Social Determinants of Health) assessments and interventions performed:    CCM Care Plan  Allergies  Allergen Reactions   Statins Other (See Comments)    Other reaction(s): Other All statin drugs Leg cramps when he was on atorvastatin and fenofibrate simultaneously. Discussed with his PCP Dr. Remus Blake on the phone [phone]) All statin drugs Leg cramps when he was on atorvastatin and fenofibrate simultaneously. Discussed with his PCP Dr. Remus Blake on  the phone [phone]) All statin drugs    Diltiazem Other (See Comments)    Chest pain   Eliquis [Apixaban] Other (See Comments)    Feels weak    Lisinopril Other (See Comments)    Weakness    Medications Reviewed Today     Reviewed by Rene Kocher, CMA (Certified Medical Assistant) on 02/12/21 at 1413  Med List Status: <None>   Medication Order Taking? Sig Documenting Provider Last Dose Status Informant  acetaminophen (TYLENOL) 650 MG CR tablet [id number]  Take 1 tablet (650 mg total) by mouth every 8 (eight) hours as needed for pain. Silverio Decamp, MD  Active   Blood Glucose Calibration (ACCU-CHEK GUIDE CONTROL) Yehuda Budd [id number]   [provider]  Active   blood glucose meter kit and supplies [id number]  Dispense based on patient and insurance preference. Use up to four times daily as directed. Dx: E11.8, E11.65 Hali Marry, MD  Active   Blood Glucose Monitoring Suppl [facility] VERIO REFLECT) w/Device KIT [id number]  1 each by Does not apply route 2 (two) times daily. Dx: E11.8, E11.65 - Check fasting blood sugar every morning and once 2 hours after largest meal of the day. Hali Marry, MD  Active   carvedilol (COREG) 12.5 MG tablet [id number]  Take 1 tablet (12.5 mg total) by mouth 2 (two) times daily with a meal. Hali Marry, MD  Active   Co-Enzyme Q-10 30 MG CAPS [id number]  Take 400 mg by mouth daily. [provider]  Active   famotidine (PEPCID) 20 MG tablet [id number]  Take 20 mg by mouth daily. [provider]  Active   finasteride (PROSCAR) 5 MG tablet [id number]  TAKE 1 TABLET BY MOUTH AT BEDTIME Hali Marry, MD  Active   gabapentin (NEURONTIN) 300 MG capsule [id number]  TAKE 2 CAPSULES BY MOUTH ONCE DAILY IN THE MORNING THEN 3 ONCE DAILY IN THE Erskine Emery, MD  Active   glipiZIDE (GLUCOTROL) 10 MG tablet [id number]  TAKE 1 TABLET BY MOUTH TWICE DAILY BEFORE MEAL(S) Hali Marry, MD   Active   glucose blood (ONETOUCH VERIO) test strip [id number]  Dx: E11.8, E11.65 - Check fasting blood sugar every morning and once 2 hours after largest meal of the day. Hali Marry, MD  Active   Insulin Pen Needle (BD ULTRA-FINE PEN NEEDLES) 29G X 12.7MM MISC [id number]  USE 1  TWICE DAILY WITH PENS Hali Marry, MD  Active   losartan (COZAAR) 25 MG tablet [id number]  Take 1 tablet by mouth once daily Hali Marry, MD  Active   Melatonin 10 MG TABS [id number]  Take 1 tablet by mouth daily. As needed. [provider]  Active   metFORMIN (GLUCOPHAGE) 1000 MG tablet [id number]  TAKE 1 TABLET BY MOUTH TWICE DAILY WITH MEALS Hali Marry, MD  Active   rosuvastatin (CRESTOR) 40 MG tablet [id number]  Take 1 tablet by mouth once daily Beatrice Lecher  D, MD  Active   sertraline (ZOLOFT) 100 MG tablet [id number]  Take 1 tablet by mouth once daily Hali Marry, MD  Active   tamsulosin [facility]) 0.4 MG CAPS capsule [id number]  Take 1 capsule by mouth twice daily Hali Marry, MD  Active   TRESIBA FLEXTOUCH 100 UNIT/ML FlexTouch Pen [id number]  INJECT 65 TO 80 UNITS SUBCUTANEOUSLY ONCE DAILY Hali Marry, MD  Active   Ultra Thin Lancets 31G [place] [id number]   [provider]  Active   XARELTO 20 MG TABS tablet [id number]  TAKE 1 TABLET BY MOUTH ONCE DAILY WITH SUPPER Hali Marry, MD  Active             Patient Active Problem List   Diagnosis Date Noted   Idiopathic peripheral neuropathy 09/16/2019   BMI 36.0-36.9,adult 03/26/2019   Severe obesity (BMI 35.0-35.9 with comorbidity) ([place]) 08/22/2018   Morbid obesity ([place]) 05/23/2018   Aortic atherosclerosis ([place]) 03/12/2018   Primary osteoarthritis of both knees 01/03/2018   BPH (benign prostatic hyperplasia) 07/10/2017   INO (internuclear ophthalmoplegia), left 07/03/2017   Hearing loss 07/03/2017   ED (erectile dysfunction) 07/03/2017   Paroxysmal A-fib ([place])  12/12/2016   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 ([place]) 12/09/2016   Thrombsis of left atrial appendage without antecedent myocardial infarction 12/09/2016   Uncontrolled diabetes mellitus with complication, without long-term current use of insulin 08/14/2008   Other and unspecified hyperlipidemia 05/20/2008   Sleep apnea 07/19/2007   Allergic rhinitis 01/07/2007   Benign essential hypertension 01/07/2007   Gout, unspecified 01/07/2007    Immunization History  Administered Date(s) Administered   Fluad Quad(high Dose 65+) 02/25/2019, 01/14/2021   Influenza, High Dose Seasonal PF 02/18/2015, 02/06/2017, 01/12/2018, 03/03/2020   Influenza, Seasonal, Injecte, Preservative Fre 02/08/2016   Influenza-Unspecified 02/08/2016, 03/02/2017   Janssen (J&J) SARS-COV-2 Vaccination 07/13/2019, 05/18/2020   PPD Test 03/02/2017   Pneumococcal Conjugate-13 01/19/2016   Pneumococcal Polysaccharide-23 09/30/2000, 02/28/2013   Tdap 05/02/2009    Conditions to be addressed/monitored: Atrial Fibrillation, HTN, HLD, and DMII  There are no care plans that you recently modified to display for this patient.   Medication Assistance:  TBD  Patient's preferred pharmacy is:  [place], [place] [address] [place] [address] Phone: [phone] Fax: [place], [place] [address]  [place] [address] Phone: [phone] Fax: [phone]  [place], [place] [address] Phone: [phone] Fax: [place], [place] [address] Phone: [phone] Fax: [phone]  Uses pill box? Yes Pt endorses 100% compliance  Follow Up:  Patient agrees to  Care Plan and Follow-up.  Plan: Telephone follow up appointment with care management team member scheduled for:  4 months  Darius Bump

## 2021-03-23 NOTE — Chronic Care Management (AMB) (Signed)
  Care Management   Note  03/23/2021 Name: [name] MRN: [id number] DOB: 08/12/1940  Tristan Mayer is a 80 y.o. year old male who is a primary care patient of Hali Marry, MD and is actively engaged with the care management team. I reached out to Tawni Levy by phone today to assist with re-scheduling a follow up visit with the Pharmacist  Follow up plan: Unsuccessful telephone outreach attempt made. A HIPAA compliant phone message was left for the patient providing contact information and requesting a return call.   Julian Hy, [place] Management  Direct Dial: [phone]

## 2021-03-24 ENCOUNTER — Other Ambulatory Visit: Payer: Self-pay | Admitting: Family Medicine

## 2021-03-25 ENCOUNTER — Other Ambulatory Visit: Payer: Self-pay | Admitting: Family Medicine

## 2021-03-25 DIAGNOSIS — N401 Enlarged prostate with lower urinary tract symptoms: Secondary | ICD-10-CM

## 2021-03-29 NOTE — Chronic Care Management (AMB) (Signed)
  Care Management   Note  03/29/2021 Name: Murle Otting MRN: [id number] DOB: [birth year]  Pate Aylward is a 80 y.o. year old male who is a primary care patient of Hali Marry, MD and is actively engaged with the care management team. I reached out to Tawni Levy by phone today to assist with re-scheduling a follow up visit with the Pharmacist  Follow up plan: Telephone appointment with care management team member scheduled for: 04/01/2021  Julian Hy, [place], [place] Management  Direct Dial: [phone]

## 2021-04-01 ENCOUNTER — Telehealth: Payer: Medicare Other

## 2021-04-01 NOTE — Progress Notes (Deleted)
Seeing cardiologist? Afib? Choice of coreg vs metop? Checking BP?  Chronic Care Management Pharmacy Note  04/01/2021 Name:  Tristan Mayer MRN:  [id number] DOB:  [birth year]  Summary: addressed HTN, HLD, DM  Recommendations/Changes made from today's visit: Decrease coreg to 1/2 tablet (= 6.75m BID) related to pt concerns of BP running too low. Reinforced goal BP, advised that 120/70s is well-controlled, but should not feel poorly or have adverse symptoms.Pt reported "very sluggish, barely able to complete last 3 holes of golf." Counseled that beta blockers do have a fatigue associated that should overcome within 2-3 weeks.Advised pt to make appt with cardiologist for non-specific chest tightness that is not resolved by switch from diltiazem to coreg. Advised seek ER if acute chest pain. Advised to discuss more w/PCP at upcoming visit.  Plan: f/u with pharmacist in 4 months  Subjective: Tristan Mayer an 80y.o. year old male who is a primary patient of Metheney, CRene Kocher MD.  The CCM team was consulted for assistance with disease management and care coordination needs.    Engaged with patient by telephone for follow up visit in response to provider referral for pharmacy case management and/or care coordination services.   Consent to Services:  The patient was given information about Chronic Care Management services, agreed to services, and gave verbal consent prior to initiation of services.  Please see initial visit note for detailed documentation.   Patient Care Team: MHali Marry MD as PCP - General (Family Medicine) KDarius Bump [facility] Pharmacist (Pharmacist)  Objective:  Lab Results  Component Value Date   CREATININE 1.62 (H) 01/14/2021   CREATININE 1.56 (H) 06/22/2020   CREATININE 1.21 (H) 12/16/2019    Lab Results  Component Value Date   HGBA1C 8.4 (A) 01/14/2021   Last diabetic Eye exam:  Lab Results  Component Value Date/Time   HMDIABEYEEXA No  Retinopathy 04/19/2019 12:00 AM    Last diabetic Foot exam: No results found for: HMDIABFOOTEX      Component Value Date/Time   CHOL 111 06/22/2020 1026   TRIG 213 (H) 06/22/2020 1026   HDL 30 (L) 06/22/2020 1026   CHOLHDL 3.7 06/22/2020 1026   LDLCALC 53 06/22/2020 1026    Hepatic Function Latest Ref Rng & Units 02/08/2021 06/22/2020 05/15/2019  Total Protein 6.1 - 8.1 g/dL 6.2 6.6 6.8  AST 10 - 35 U/L - 15 21  ALT 9 - 46 U/L - 20 18  Total Bilirubin 0.2 - 1.2 mg/dL - 0.9 1.1    No results found for: TSH, FREET4  CBC Latest Ref Rng & Units 02/08/2021 06/22/2020 09/20/2017  WBC 3.8 - 10.8 Thousand/uL 7.3 7.8 6.6  Hemoglobin 13.2 - 17.1 g/dL 13.7 13.5 14.1  Hematocrit 38.5 - 50.0 % 41.5 39.3 40.9  Platelets 140 - 400 Thousand/uL 165 190 179     Social History   Tobacco Use  Smoking Status Some Days   Types: Cigars  Smokeless Tobacco Never   BP Readings from Last 3 Encounters:  02/12/21 (!) 151/69  01/14/21 133/60  09/21/20 (!) 134/54   Pulse Readings from Last 3 Encounters:  02/12/21 (!) 54  01/14/21 75  09/21/20 73   Wt Readings from Last 3 Encounters:  02/12/21 250 lb (113.4 kg)  01/14/21 250 lb (113.4 kg)  09/21/20 250 lb (113.4 kg)    Assessment: Review of patient past medical history, allergies, medications, health status, including review of consultants reports, laboratory and other test data, was performed as part  of comprehensive evaluation and provision of chronic care management services.   SDOH:  (Social Determinants of Health) assessments and interventions performed:    CCM Care Plan  Allergies  Allergen Reactions   Statins Other (See Comments)    Other reaction(s): Other All statin drugs Leg cramps when he was on atorvastatin and fenofibrate simultaneously. Discussed with his PCP Dr. Remus Blake on the phone [phone]) All statin drugs Leg cramps when he was on atorvastatin and fenofibrate simultaneously. Discussed with his PCP Dr. Remus Blake on  the phone [phone]) All statin drugs    Diltiazem Other (See Comments)    Chest pain   Eliquis [Apixaban] Other (See Comments)    Feels weak    Lisinopril Other (See Comments)    Weakness    Medications Reviewed Today     Reviewed by Rene Kocher, CMA (Certified Medical Assistant) on 02/12/21 at 1413  Med List Status: <None>   Medication Order Taking? Sig Documenting Provider Last Dose Status Informant  acetaminophen (TYLENOL) 650 MG CR tablet [id number]  Take 1 tablet (650 mg total) by mouth every 8 (eight) hours as needed for pain. Silverio Decamp, MD  Active   Blood Glucose Calibration (ACCU-CHEK GUIDE CONTROL) Yehuda Budd [id number]   [provider]  Active   blood glucose meter kit and supplies [id number]  Dispense based on patient and insurance preference. Use up to four times daily as directed. Dx: E11.8, E11.65 Hali Marry, MD  Active   Blood Glucose Monitoring Suppl [facility] VERIO REFLECT) w/Device KIT [id number]  1 each by Does not apply route 2 (two) times daily. Dx: E11.8, E11.65 - Check fasting blood sugar every morning and once 2 hours after largest meal of the day. Hali Marry, MD  Active   carvedilol (COREG) 12.5 MG tablet [id number]  Take 1 tablet (12.5 mg total) by mouth 2 (two) times daily with a meal. Hali Marry, MD  Active   Co-Enzyme Q-10 30 MG CAPS [id number]  Take 400 mg by mouth daily. [provider]  Active   famotidine (PEPCID) 20 MG tablet [id number]  Take 20 mg by mouth daily. [provider]  Active   finasteride (PROSCAR) 5 MG tablet [id number]  TAKE 1 TABLET BY MOUTH AT BEDTIME Hali Marry, MD  Active   gabapentin (NEURONTIN) 300 MG capsule [id number]  TAKE 2 CAPSULES BY MOUTH ONCE DAILY IN THE MORNING THEN 3 ONCE DAILY IN THE Erskine Emery, MD  Active   glipiZIDE (GLUCOTROL) 10 MG tablet [id number]  TAKE 1 TABLET BY MOUTH TWICE DAILY BEFORE MEAL(S) Hali Marry, MD   Active   glucose blood (ONETOUCH VERIO) test strip [id number]  Dx: E11.8, E11.65 - Check fasting blood sugar every morning and once 2 hours after largest meal of the day. Hali Marry, MD  Active   Insulin Pen Needle (BD ULTRA-FINE PEN NEEDLES) 29G X 12.7MM MISC [id number]  USE 1  TWICE DAILY WITH PENS Hali Marry, MD  Active   losartan (COZAAR) 25 MG tablet [id number]  Take 1 tablet by mouth once daily Hali Marry, MD  Active   Melatonin 10 MG TABS [id number]  Take 1 tablet by mouth daily. As needed. [provider]  Active   metFORMIN (GLUCOPHAGE) 1000 MG tablet [id number]  TAKE 1 TABLET BY MOUTH TWICE DAILY WITH MEALS Hali Marry, MD  Active   rosuvastatin (CRESTOR) 40 MG tablet [id number]  Take 1 tablet by mouth once daily Beatrice Lecher  D, MD  Active   sertraline (ZOLOFT) 100 MG tablet [id number]  Take 1 tablet by mouth once daily Hali Marry, MD  Active   tamsulosin [facility]) 0.4 MG CAPS capsule [id number]  Take 1 capsule by mouth twice daily Hali Marry, MD  Active   TRESIBA FLEXTOUCH 100 UNIT/ML FlexTouch Pen [id number]  INJECT 65 TO 80 UNITS SUBCUTANEOUSLY ONCE DAILY Hali Marry, MD  Active   Ultra Thin Lancets 31G [place] [id number]   [provider]  Active   XARELTO 20 MG TABS tablet [id number]  TAKE 1 TABLET BY MOUTH ONCE DAILY WITH SUPPER Hali Marry, MD  Active             Patient Active Problem List   Diagnosis Date Noted   Idiopathic peripheral neuropathy 09/16/2019   BMI 36.0-36.9,adult 03/26/2019   Severe obesity (BMI 35.0-35.9 with comorbidity) ([place]) 08/22/2018   Morbid obesity ([place]) 05/23/2018   Aortic atherosclerosis ([place]) 03/12/2018   Primary osteoarthritis of both knees 01/03/2018   BPH (benign prostatic hyperplasia) 07/10/2017   INO (internuclear ophthalmoplegia), left 07/03/2017   Hearing loss 07/03/2017   ED (erectile dysfunction) 07/03/2017   Paroxysmal A-fib ([place])  12/12/2016   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 ([place]) 12/09/2016   Thrombsis of left atrial appendage without antecedent myocardial infarction 12/09/2016   Uncontrolled diabetes mellitus with complication, without long-term current use of insulin 08/14/2008   Other and unspecified hyperlipidemia 05/20/2008   Sleep apnea 07/19/2007   Allergic rhinitis 01/07/2007   Benign essential hypertension 01/07/2007   Gout, unspecified 01/07/2007    Immunization History  Administered Date(s) Administered   Fluad Quad(high Dose 65+) 02/25/2019, 01/14/2021   Influenza, High Dose Seasonal PF 02/18/2015, 02/06/2017, 01/12/2018, 03/03/2020   Influenza, Seasonal, Injecte, Preservative Fre 02/08/2016   Influenza-Unspecified 02/08/2016, 03/02/2017   Janssen (J&J) SARS-COV-2 Vaccination 07/13/2019, 05/18/2020   PPD Test 03/02/2017   Pneumococcal Conjugate-13 01/19/2016   Pneumococcal Polysaccharide-23 09/30/2000, 02/28/2013   Tdap 05/02/2009    Conditions to be addressed/monitored: Atrial Fibrillation, HTN, HLD, and DMII  There are no care plans that you recently modified to display for this patient.   Medication Assistance:  TBD  Patient's preferred pharmacy is:  [place], [place] [address] [place] [address] Phone: [phone] Fax: [place], [place] [address]  [place] [address] Phone: [phone] Fax: [phone]  [place], [place] [address] Phone: [phone] Fax: [place], [place] [address] Phone: [phone] Fax: [phone]  Uses pill box? Yes Pt endorses 100% compliance  Follow Up:  Patient agrees to  Care Plan and Follow-up.  Plan: Telephone follow up appointment with care management team member scheduled for:  4 months  Darius Bump

## 2021-04-15 ENCOUNTER — Ambulatory Visit: Payer: Medicare Other | Admitting: Family Medicine

## 2021-04-24 ENCOUNTER — Other Ambulatory Visit: Payer: Self-pay | Admitting: Family Medicine

## 2021-05-06 DIAGNOSIS — E119 Type 2 diabetes mellitus without complications: Secondary | ICD-10-CM | POA: Diagnosis not present

## 2021-05-13 ENCOUNTER — Other Ambulatory Visit: Payer: Self-pay | Admitting: Neurology

## 2021-05-13 MED ORDER — TRESIBA FLEXTOUCH 100 UNIT/ML ~~LOC~~ SOPN: PEN_INJECTOR | SUBCUTANEOUS | 0 refills | Status: DC

## 2021-05-26 ENCOUNTER — Other Ambulatory Visit: Payer: Self-pay | Admitting: Family Medicine

## 2021-05-29 ENCOUNTER — Other Ambulatory Visit: Payer: Self-pay | Admitting: Family Medicine

## 2021-06-02 ENCOUNTER — Ambulatory Visit (INDEPENDENT_AMBULATORY_CARE_PROVIDER_SITE_OTHER): Payer: Medicare Other | Admitting: Family Medicine

## 2021-06-02 ENCOUNTER — Encounter: Payer: Self-pay | Admitting: Family Medicine

## 2021-06-02 ENCOUNTER — Other Ambulatory Visit: Payer: Self-pay

## 2021-06-02 VITALS — BP 125/51 | HR 61 | Resp 18 | Ht 70.0 in | Wt 249.0 lb

## 2021-06-02 DIAGNOSIS — I10 Essential (primary) hypertension: Secondary | ICD-10-CM | POA: Diagnosis not present

## 2021-06-02 DIAGNOSIS — E1165 Type 2 diabetes mellitus with hyperglycemia: Secondary | ICD-10-CM

## 2021-06-02 DIAGNOSIS — Z6835 Body mass index (BMI) 35.0-35.9, adult: Secondary | ICD-10-CM

## 2021-06-02 DIAGNOSIS — I700 Atherosclerosis of aorta: Secondary | ICD-10-CM

## 2021-06-02 LAB — POCT GLYCOSYLATED HEMOGLOBIN (HGB A1C): Hemoglobin A1C: 7.6 % — AB (ref 4.0–5.6)

## 2021-06-02 MED ORDER — OZEMPIC (0.25 OR 0.5 MG/DOSE) 2 MG/1.5ML ~~LOC~~ SOPN: 0.2500 mg | PEN_INJECTOR | SUBCUTANEOUS | 1 refills | Status: DC

## 2021-06-02 MED ORDER — ROSUVASTATIN CALCIUM 40 MG PO TABS: 40.0000 mg | ORAL_TABLET | Freq: Every day | ORAL | 3 refills | Status: DC

## 2021-06-02 NOTE — Assessment & Plan Note (Signed)
Continue to work on [employer] choices he has made some great changes I was also hopeful that he might of lost a little bit more weight but he is down 1 pound I am hopeful that are switching medications will help him as well I think if we can get him off of the glipizide it will reduce some of his hunger signals.

## 2021-06-02 NOTE — Patient Instructions (Signed)
Please start the once weekly shot called Ozempic. Once you have been on it for 2 weeks go ahead and start splitting your glipizide in half. If you start noticing low blood sugars then you can cut it in half sooner.

## 2021-06-02 NOTE — Assessment & Plan Note (Addendum)
A1c is still not well controlled.  7.6 today but it does look much better.  So he has made some dietary changes which are fantastic.  We discussed problems the possibility of starting a GLP-1 I really like to get him off of the glipizide so that it would reduce his hunger cravings I think he would struggle less.  And if were able to reduce his weight also feel like his diabetes would be better as well as some of his more chronic shortness of breath. We will start with Ozempic 0.25 mg weekly and then try to increase to 0.5 mg weekly.  My hope is that we may also be able to taper him off his insulin or at least down.  Went to cut glipizide in half and about 2 weeks after starting the injection.

## 2021-06-02 NOTE — Assessment & Plan Note (Signed)
Well controlled. Continue current regimen. Follow up in  6 mo  

## 2021-06-02 NOTE — Progress Notes (Signed)
Established Patient Office Visit  Subjective:  Patient ID: Tristan Mayer, male    DOB: [birth year]  Age: 81 y.o. MRN: [id number]  CC:  Chief Complaint  Patient presents with   Diabetes    Follow up     HPI Tristan Mayer presents for   Diabetes -  No wounds or sores that are not healing well. No increased thirst or urination. Checking glucose at home. Taking medications as prescribed without any side effects.  He states he really has been trying to do a little better with his diet.  He is still taking his medications regularly.  He says he has had a few mornings where he has had a glucose less than 80.  At one point it dropped into the 71s and he said he actually felt fine.  Currently using 58 units of Tresiba on nights before he is can go play golf and 65 units on the other days.  He does need a handicap placard filled out as he does get short of breath with walking.  Hypertension- Pt denies chest pain, SOB, dizziness, or heart palpitations.  Taking meds as directed w/o problems.  Denies medication side effects.      Past Medical History:  Diagnosis Date   Atrial fibrillation ([place])    CVA (cerebral vascular accident) ([place])    Diabetes mellitus ([place])    GERD (gastroesophageal reflux disease)    Hyperlipidemia    PUD (peptic ulcer disease)     Past Surgical History:  Procedure Laterality Date   CATARACT EXTRACTION W/ INTRAOCULAR LENS IMPLANT Right     Family History  Problem Relation Age of Onset   Prostate cancer Father 59   Stroke Father    Diabetes Brother    Diabetes Brother    Atrial fibrillation Mother     Social History   Socioeconomic History   Marital status: Married    Spouse name: [name]   Number of children: 3   Years of education: 12th Grade   Highest education level: 12th grade  Occupational History   Occupation: Retired  Tobacco Use   Smoking status: Some Days    Types: Cigars   Smokeless tobacco: Never  Substance and Sexual Activity   Alcohol  use: Yes    Comment: Occasional   Drug use: Never   Sexual activity: Not on file  Other Topics Concern   Not on file  Social History Narrative   Lives with his wife. He enjoys playing golf three times a week.   Social Determinants of Health   Financial Resource Strain: Low Risk    Difficulty of Paying Living Expenses: Not hard at all  Food Insecurity: No Food Insecurity   Worried About Charity fundraiser in the Last Year: Never true   [place] in the Last Year: Never true  Transportation Needs: No Transportation Needs   Lack of Transportation (Medical): No   Lack of Transportation (Non-Medical): No  Physical Activity: Inactive   Days of Exercise per Week: 0 days   Minutes of Exercise per Session: 0 min  Stress: No Stress Concern Present   Feeling of Stress : Not at all  Social Connections: Moderately Isolated   Frequency of Communication with Friends and Family: Once a week   Frequency of Social Gatherings with Friends and Family: Never   Attends Religious Services: More than 4 times per year   Active Member of [employer] or Organizations: No   Attends Archivist Meetings:  Never   Marital Status: Married  Human resources officer Violence: Not At Risk   Fear of Current or Ex-Partner: No   Emotionally Abused: No   Physically Abused: No   Sexually Abused: No    Outpatient Medications Prior to Visit  Medication Sig Dispense Refill   acetaminophen (TYLENOL) 650 MG CR tablet Take 1 tablet (650 mg total) by mouth every 8 (eight) hours as needed for pain. 90 tablet 3   Blood Glucose Calibration (ACCU-CHEK GUIDE CONTROL) LIQD      blood glucose meter kit and supplies Dispense based on patient and insurance preference. Use up to four times daily as directed. Dx: E11.8, E11.65 1 each 0   Blood Glucose Monitoring Suppl (ONETOUCH VERIO REFLECT) w/Device KIT 1 each by Does not apply route 2 (two) times daily. Dx: E11.8, E11.65 - Check fasting blood sugar every morning and once 2  hours after largest meal of the day. 1 kit prn   carvedilol (COREG) 12.5 MG tablet Take 1 tablet (12.5 mg total) by mouth 2 (two) times daily with a meal. 60 tablet 5   Co-Enzyme Q-10 30 MG CAPS Take 400 mg by mouth daily.     finasteride (PROSCAR) 5 MG tablet TAKE 1 TABLET BY MOUTH AT BEDTIME 90 tablet 0   gabapentin (NEURONTIN) 300 MG capsule TAKE 2 CAPSULES BY MOUTH ONCE DAILY IN THE MORNING THEN 3 ONCE DAILY IN THE EVENING 450 capsule 0   glipiZIDE (GLUCOTROL) 10 MG tablet TAKE 1 TABLET BY MOUTH TWICE DAILY BEFORE MEAL(S) 180 tablet 0   glucose blood (ONETOUCH VERIO) test strip Dx: E11.8, E11.65 - Check fasting blood sugar every morning and once 2 hours after largest meal of the day. 100 each 12   insulin degludec (TRESIBA FLEXTOUCH) 100 UNIT/ML FlexTouch Pen INJECT 65 TO 80 UNITS SUBCUTANEOUSLY ONCE DAILY/ KEEP APPT FOR REFILLS 24 mL 0   losartan (COZAAR) 25 MG tablet Take 1 tablet by mouth once daily 90 tablet 0   metFORMIN (GLUCOPHAGE) 1000 MG tablet TAKE 1 TABLET BY MOUTH TWICE DAILY WITH MEALS. NO REFILLS. OVERDUE FOR DM CHECK. 60 tablet 0   sertraline (ZOLOFT) 100 MG tablet Take 1 tablet by mouth once daily 90 tablet 0   tamsulosin (FLOMAX) 0.4 MG CAPS capsule Take 1 capsule by mouth twice daily 180 capsule 0   Ultra Thin Lancets 31G MISC      XARELTO 20 MG TABS tablet TAKE 1 TABLET BY MOUTH ONCE DAILY WITH SUPPER 90 tablet 3   rosuvastatin (CRESTOR) 40 MG tablet Take 1 tablet by mouth once daily 90 tablet 0   No facility-administered medications prior to visit.    Allergies  Allergen Reactions   Statins Other (See Comments)    Other reaction(s): Other All statin drugs Leg cramps when he was on atorvastatin and fenofibrate simultaneously. Discussed with his PCP Dr. Remus Blake on the phone [phone]) All statin drugs Leg cramps when he was on atorvastatin and fenofibrate simultaneously. Discussed with his PCP Dr. Remus Blake on the phone [phone]) All statin drugs    Diltiazem  Other (See Comments)    Chest pain   Eliquis [Apixaban] Other (See Comments)    Feels weak    Lisinopril Other (See Comments)    Weakness    ROS Review of Systems    Objective:    Physical Exam Constitutional:      Appearance: Normal appearance. He is well-developed.  HENT:     Head: Normocephalic and atraumatic.  Cardiovascular:  Rate and Rhythm: Normal rate and regular rhythm.     Heart sounds: Normal heart sounds.  Pulmonary:     Effort: Pulmonary effort is normal.     Breath sounds: Normal breath sounds.  Skin:    General: Skin is warm and dry.  Neurological:     Mental Status: He is alert and oriented to person, place, and time. Mental status is at baseline.  Psychiatric:        Behavior: Behavior normal.    BP (!) 125/51 (BP Location: Left Arm)    Pulse 61    Resp 18    Ht 5' 10"  (1.778 m)    Wt 249 lb (112.9 kg)    SpO2 96%    BMI 35.73 kg/m  Wt Readings from Last 3 Encounters:  06/02/21 249 lb (112.9 kg)  02/12/21 250 lb (113.4 kg)  01/14/21 250 lb (113.4 kg)     Health Maintenance Due  Topic Date Due   OPHTHALMOLOGY EXAM  04/30/2021    There are no preventive care reminders to display for this patient.  No results found for: TSH Lab Results  Component Value Date   WBC 7.3 02/08/2021   HGB 13.7 02/08/2021   HCT 41.5 02/08/2021   MCV 89.8 02/08/2021   PLT 165 02/08/2021   Lab Results  Component Value Date   NA 139 01/14/2021   K 4.6 01/14/2021   CO2 26 01/14/2021   GLUCOSE 164 (H) 01/14/2021   BUN 25 01/14/2021   CREATININE 1.62 (H) 01/14/2021   BILITOT 0.9 06/22/2020   AST 15 06/22/2020   ALT 20 06/22/2020   PROT 6.2 02/08/2021   CALCIUM 9.2 01/14/2021   EGFR 43 (L) 01/14/2021   Lab Results  Component Value Date   CHOL 111 06/22/2020   Lab Results  Component Value Date   HDL 30 (L) 06/22/2020   Lab Results  Component Value Date   LDLCALC 53 06/22/2020   Lab Results  Component Value Date   TRIG 213 (H) 06/22/2020   Lab  Results  Component Value Date   CHOLHDL 3.7 06/22/2020   Lab Results  Component Value Date   HGBA1C 7.6 (A) 06/02/2021      Assessment & Plan:   Problem List Items Addressed This Visit       Cardiovascular and Mediastinum   Benign essential hypertension    Well controlled. Continue current regimen. Follow up in  38mo      Relevant Medications   rosuvastatin (CRESTOR) 40 MG tablet   Aortic atherosclerosis (HCC)    Continue daily statin.      Relevant Medications   rosuvastatin (CRESTOR) 40 MG tablet     Endocrine   Uncontrolled diabetes mellitus with hyperglycemia ([facility]) - Primary    A1c is still not well controlled.  7.6 today but it does look much better.  So he has made some dietary changes which are fantastic.  We discussed problems the possibility of starting a GLP-1 I really like to get him off of the glipizide so that it would reduce his hunger cravings I think he would struggle less.  And if were able to reduce his weight also feel like his diabetes would be better as well as some of his more chronic shortness of breath. We will start with Ozempic 0.25 mg weekly and then try to increase to 0.5 mg weekly.  My hope is that we may also be able to taper him off his insulin or at  least down.  Went to cut glipizide in half and about 2 weeks after starting the injection.      Relevant Medications   Semaglutide,0.25 or 0.5MG/DOS, (OZEMPIC, 0.25 OR 0.5 MG/DOSE,) 2 MG/1.5ML SOPN   rosuvastatin (CRESTOR) 40 MG tablet   Other Relevant Orders   POCT HgB A1C (Completed)     Other   Severe obesity (BMI 35.0-35.9 with comorbidity) ([place])    Continue to work on healthy food choices he has made some great changes I was also hopeful that he might of lost a little bit more weight but he is down 1 pound I am hopeful that are switching medications will help him as well I think if we can get him off of the glipizide it will reduce some of his hunger signals.      Relevant Medications    Semaglutide,0.25 or 0.5MG/DOS, (OZEMPIC, 0.25 OR 0.5 MG/DOSE,) 2 MG/1.5ML SOPN    Meds ordered this encounter  Medications   Semaglutide,0.25 or 0.5MG/DOS, (OZEMPIC, 0.25 OR 0.5 MG/DOSE,) 2 MG/1.5ML SOPN    Sig: Inject 0.25 mg into the skin once a week.    Dispense:  3 mL    Refill:  1   rosuvastatin (CRESTOR) 40 MG tablet    Sig: Take 1 tablet (40 mg total) by mouth at bedtime.    Dispense:  90 tablet    Refill:  3    Follow-up: Return in about 4 weeks (around 06/30/2021) for Diabetes follow-up and labwork, - please fast. .    Beatrice Lecher, MD

## 2021-06-02 NOTE — Progress Notes (Signed)
Patient states eye exam was done 2022 at [facility] in [place]. Release form signed.

## 2021-06-02 NOTE — Assessment & Plan Note (Signed)
Continue daily statin. 

## 2021-06-07 ENCOUNTER — Other Ambulatory Visit: Payer: Self-pay | Admitting: Family Medicine

## 2021-06-08 ENCOUNTER — Other Ambulatory Visit: Payer: Self-pay | Admitting: Family Medicine

## 2021-07-05 ENCOUNTER — Other Ambulatory Visit: Payer: Self-pay

## 2021-07-05 ENCOUNTER — Encounter: Payer: Self-pay | Admitting: Family Medicine

## 2021-07-05 ENCOUNTER — Ambulatory Visit (INDEPENDENT_AMBULATORY_CARE_PROVIDER_SITE_OTHER): Payer: Medicare Other | Admitting: Family Medicine

## 2021-07-05 VITALS — BP 126/68 | HR 48 | Ht 70.0 in | Wt 241.0 lb

## 2021-07-05 DIAGNOSIS — I10 Essential (primary) hypertension: Secondary | ICD-10-CM | POA: Diagnosis not present

## 2021-07-05 DIAGNOSIS — R001 Bradycardia, unspecified: Secondary | ICD-10-CM

## 2021-07-05 DIAGNOSIS — E669 Obesity, unspecified: Secondary | ICD-10-CM

## 2021-07-05 DIAGNOSIS — I440 Atrioventricular block, first degree: Secondary | ICD-10-CM | POA: Diagnosis not present

## 2021-07-05 DIAGNOSIS — E1165 Type 2 diabetes mellitus with hyperglycemia: Secondary | ICD-10-CM | POA: Diagnosis not present

## 2021-07-05 DIAGNOSIS — I480 Paroxysmal atrial fibrillation: Secondary | ICD-10-CM | POA: Diagnosis not present

## 2021-07-05 MED ORDER — SEMAGLUTIDE (1 MG/DOSE) 4 MG/3ML ~~LOC~~ SOPN: 1.0000 mg | PEN_INJECTOR | SUBCUTANEOUS | 2 refills | Status: DC

## 2021-07-05 NOTE — Assessment & Plan Note (Signed)
BMI  is down to 34 which is great.  Really doing fantastic though I did warn against skipping meals completely especially since he is still on insulin.  And to make sure that he is getting in some protein with breakfast. ?

## 2021-07-05 NOTE — Assessment & Plan Note (Signed)
He is really doing fantastic on the Ozempic up to 0.5 mg.  He has a believe 2 more injections and then we will go ahead and go up to the 1 mg dose.  He is off of the glipizide and is already down on his insulin.  He says blood sugars have been under 100 fasting in the mornings.  We will continue with current regimen and I will see him back in 3 months at that point we will repeat his A1c. ?

## 2021-07-05 NOTE — Assessment & Plan Note (Addendum)
Still in A-fib today but his overall rate is actually pretty low so we may need to look at adjusting the carvedilol.  Continue Xarelto. ?

## 2021-07-05 NOTE — Progress Notes (Signed)
Established Patient Office Visit  Subjective:  Patient ID: Tristan Mayer, male    DOB: [birth year]  Age: 82 y.o. MRN: [id number]  CC:  Chief Complaint  Patient presents with   Diabetes    4 week f/u    HPI Tristan Mayer presents for   Diabetes - no hypoglycemic events. No wounds or sores that are not healing well. No increased thirst or urination. Checking glucose at home. Taking medications as prescribed without any side effects.  Came off the glipizide about 2 weeks ago.  He started Ozempic.  Tolerating it well. He has lost about 8 lbs. says he is actually already been using 0.5 mg.  He has noticed a significant change in his appetite.  He is also been able to reduce his insulin to 42 units and says most of his blood sugars are under 100.  He still playing golf 3 times per week.  Occasionally will eat very light or even skip meals.  Past Medical History:  Diagnosis Date   Atrial fibrillation ([place])    CVA (cerebral vascular accident) ([place])    Diabetes mellitus ([place])    GERD (gastroesophageal reflux disease)    Hyperlipidemia    PUD (peptic ulcer disease)     Past Surgical History:  Procedure Laterality Date   CATARACT EXTRACTION W/ INTRAOCULAR LENS IMPLANT Right     Family History  Problem Relation Age of Onset   Prostate cancer Father 62   Stroke Father    Diabetes Brother    Diabetes Brother    Atrial fibrillation Mother     Social History   Socioeconomic History   Marital status: Married    Spouse name: Tristan Mayer   Number of children: 3   Years of education: 12th Grade   Highest education level: 12th grade  Occupational History   Occupation: Retired  Tobacco Use   Smoking status: Some Days    Types: Cigars   Smokeless tobacco: Never  Substance and Sexual Activity   Alcohol use: Yes    Comment: Occasional   Drug use: Never   Sexual activity: Not on file  Other Topics Concern   Not on file  Social History Narrative   Lives with his wife. He enjoys playing  golf three times a week.   Social Determinants of Health   Financial Resource Strain: Low Risk    Difficulty of Paying Living Expenses: Not hard at all  Food Insecurity: No Food Insecurity   Worried About Charity fundraiser in the Last Year: Never true   [place] in the Last Year: Never true  Transportation Needs: No Transportation Needs   Lack of Transportation (Medical): No   Lack of Transportation (Non-Medical): No  Physical Activity: Inactive   Days of Exercise per Week: 0 days   Minutes of Exercise per Session: 0 min  Stress: No Stress Concern Present   Feeling of Stress : Not at all  Social Connections: Moderately Isolated   Frequency of Communication with Friends and Family: Once a week   Frequency of Social Gatherings with Friends and Family: Never   Attends Religious Services: More than 4 times per year   Active Member of [employer] or Organizations: No   Attends Archivist Meetings: Never   Marital Status: Married  Human resources officer Violence: Not At Risk   Fear of Current or Ex-Partner: No   Emotionally Abused: No   Physically Abused: No   Sexually Abused: No    Outpatient  Medications Prior to Visit  Medication Sig Dispense Refill   acetaminophen (TYLENOL) 650 MG CR tablet Take 1 tablet (650 mg total) by mouth every 8 (eight) hours as needed for pain. 90 tablet 3   Blood Glucose Calibration (ACCU-CHEK GUIDE CONTROL) LIQD      blood glucose meter kit and supplies Dispense based on patient and insurance preference. Use up to four times daily as directed. Dx: E11.8, E11.65 1 each 0   Blood Glucose Monitoring Suppl (ONETOUCH VERIO REFLECT) w/Device KIT 1 each by Does not apply route 2 (two) times daily. Dx: E11.8, E11.65 - Check fasting blood sugar every morning and once 2 hours after largest meal of the day. 1 kit prn   carvedilol (COREG) 12.5 MG tablet Take 1 tablet (12.5 mg total) by mouth 2 (two) times daily with a meal. 60 tablet 5   Co-Enzyme Q-10 30 MG  CAPS Take 400 mg by mouth daily.     finasteride (PROSCAR) 5 MG tablet TAKE 1 TABLET BY MOUTH AT BEDTIME 90 tablet 0   gabapentin (NEURONTIN) 300 MG capsule TAKE 2 CAPSULES BY MOUTH IN THE MORNING AND 3 IN THE EVENING 450 capsule 0   glucose blood (ONETOUCH VERIO) test strip Dx: E11.8, E11.65 - Check fasting blood sugar every morning and once 2 hours after largest meal of the day. 100 each 12   insulin degludec (TRESIBA FLEXTOUCH) 100 UNIT/ML FlexTouch Pen INJECT 65 TO 80 UNITS SUBCUTANEOUSLY ONCE DAILY/ KEEP APPT FOR REFILLS 24 mL 0   losartan (COZAAR) 25 MG tablet Take 1 tablet by mouth once daily 90 tablet 0   metFORMIN (GLUCOPHAGE) 1000 MG tablet TAKE 1 TABLET BY MOUTH TWICE DAILY WITH MEALS 180 tablet 0   rosuvastatin (CRESTOR) 40 MG tablet Take 1 tablet (40 mg total) by mouth at bedtime. 90 tablet 3   sertraline (ZOLOFT) 100 MG tablet Take 1 tablet by mouth once daily 90 tablet 0   tamsulosin (FLOMAX) 0.4 MG CAPS capsule Take 1 capsule by mouth twice daily 180 capsule 0   Ultra Thin Lancets 31G MISC      XARELTO 20 MG TABS tablet TAKE 1 TABLET BY MOUTH ONCE DAILY WITH SUPPER 90 tablet 3   Semaglutide,0.25 or 0.5MG/DOS, (OZEMPIC, 0.25 OR 0.5 MG/DOSE,) 2 MG/1.5ML SOPN Inject 0.25 mg into the skin once a week. 3 mL 1   glipiZIDE (GLUCOTROL) 10 MG tablet TAKE 1 TABLET BY MOUTH TWICE DAILY BEFORE MEAL(S) 180 tablet 0   No facility-administered medications prior to visit.    Allergies  Allergen Reactions   Statins Other (See Comments)    Other reaction(s): Other All statin drugs Leg cramps when he was on atorvastatin and fenofibrate simultaneously. Discussed with his PCP Dr. Remus Blake on the phone [phone]) All statin drugs Leg cramps when he was on atorvastatin and fenofibrate simultaneously. Discussed with his PCP Dr. Remus Blake on the phone [phone]) All statin drugs    Diltiazem Other (See Comments)    Chest pain   Eliquis [Apixaban] Other (See Comments)    Feels weak     Lisinopril Other (See Comments)    Weakness    ROS Review of Systems    Objective:    Physical Exam Constitutional:      Appearance: Normal appearance. He is well-developed.  HENT:     Head: Normocephalic and atraumatic.  Cardiovascular:     Rate and Rhythm: Regular rhythm. Bradycardia present.     Heart sounds: Normal heart sounds.  Pulmonary:  Effort: Pulmonary effort is normal.     Breath sounds: Normal breath sounds.  Skin:    General: Skin is warm and dry.  Neurological:     Mental Status: He is alert and oriented to person, place, and time. Mental status is at baseline.  Psychiatric:        Behavior: Behavior normal.    BP 126/68    Pulse (!) 48    Ht 5' 10"  (1.778 m)    Wt 241 lb (109.3 kg)    SpO2 97%    BMI 34.58 kg/m  Wt Readings from Last 3 Encounters:  07/05/21 241 lb (109.3 kg)  06/02/21 249 lb (112.9 kg)  02/12/21 250 lb (113.4 kg)     Health Maintenance Due  Topic Date Due   COVID-19 Vaccine (3 - Booster for Janssen series) 07/13/2020   OPHTHALMOLOGY EXAM  04/30/2021    There are no preventive care reminders to display for this patient.  No results found for: TSH Lab Results  Component Value Date   WBC 7.3 02/08/2021   HGB 13.7 02/08/2021   HCT 41.5 02/08/2021   MCV 89.8 02/08/2021   PLT 165 02/08/2021   Lab Results  Component Value Date   NA 139 01/14/2021   K 4.6 01/14/2021   CO2 26 01/14/2021   GLUCOSE 164 (H) 01/14/2021   BUN 25 01/14/2021   CREATININE 1.62 (H) 01/14/2021   BILITOT 0.9 06/22/2020   AST 15 06/22/2020   ALT 20 06/22/2020   PROT 6.2 02/08/2021   CALCIUM 9.2 01/14/2021   EGFR 43 (L) 01/14/2021   Lab Results  Component Value Date   CHOL 111 06/22/2020   Lab Results  Component Value Date   HDL 30 (L) 06/22/2020   Lab Results  Component Value Date   LDLCALC 53 06/22/2020   Lab Results  Component Value Date   TRIG 213 (H) 06/22/2020   Lab Results  Component Value Date   CHOLHDL 3.7 06/22/2020    Lab Results  Component Value Date   HGBA1C 7.6 (A) 06/02/2021      Assessment & Plan:   Problem List Items Addressed This Visit       Cardiovascular and Mediastinum   Paroxysmal A-fib ([place])    Still in A-fib today but his overall rate is actually pretty low so we may need to look at adjusting the carvedilol.  Continue Xarelto.      Benign essential hypertension   Relevant Orders   Lipid panel   COMPLETE METABOLIC PANEL WITH GFR   AV block, 1st degree   Relevant Orders   Ambulatory referral to Cardiology     Endocrine   Uncontrolled diabetes mellitus with hyperglycemia ([place]) - Primary    He is really doing fantastic on the Ozempic up to 0.5 mg.  He has a believe 2 more injections and then we will go ahead and go up to the 1 mg dose.  He is off of the glipizide and is already down on his insulin.  He says blood sugars have been under 100 fasting in the mornings.  We will continue with current regimen and I will see him back in 3 months at that point we will repeat his A1c.      Relevant Medications   Semaglutide, 1 MG/DOSE, 4 MG/3ML SOPN   Other Relevant Orders   Lipid panel   COMPLETE METABOLIC PANEL WITH GFR     Other   Obesity, Class I, BMI 30.0-34.9 (see actual  BMI)    BMI  is down to 34 which is great.  Really doing fantastic though I did warn against skipping meals completely especially since he is still on insulin.  And to make sure that he is getting in some protein with breakfast.      Other Visit Diagnoses     Bradycardia       Relevant Orders   EKG 12-Lead       Bradycardia-pulse is low today but he is asymptomatic.  He says he feels fine.  But I went ahead and did an EKG on him today.  EKG showed a rate of 64 bpm but with first-degree AV block with possible PACs and right bundle branch block.  I like to refer him to cardiology for further work-up.  We will scan an EKG.  Have him cut his carvedilol in half.   Meds ordered this encounter  Medications    Semaglutide, 1 MG/DOSE, 4 MG/3ML SOPN    Sig: Inject 1 mg as directed once a week.    Dispense:  3 mL    Refill:  2    Follow-up: Return in about 3 months (around 10/05/2021) for Diabetes follow-up.    Beatrice Lecher, MD

## 2021-07-05 NOTE — Patient Instructions (Signed)
Cut your Carvedilol down to 6.25 mg twice a day.  ? ?There was a slight change in your EKG so we need to send you to Cardiology to look into this.  ?

## 2021-07-06 LAB — COMPLETE METABOLIC PANEL WITH GFR
AG Ratio: 1.8 (calc) (ref 1.0–2.5)
ALT: 23 U/L (ref 9–46)
AST: 23 U/L (ref 10–35)
Albumin: 4.2 g/dL (ref 3.6–5.1)
Alkaline phosphatase (APISO): 67 U/L (ref 35–144)
BUN/Creatinine Ratio: 15 (calc) (ref 6–22)
BUN: 24 mg/dL (ref 7–25)
CO2: 26 mmol/L (ref 20–32)
Calcium: 9.8 mg/dL (ref 8.6–10.3)
Chloride: 104 mmol/L (ref 98–110)
Creat: 1.56 mg/dL — ABNORMAL HIGH (ref 0.70–1.28)
Globulin: 2.4 g/dL (calc) (ref 1.9–3.7)
Glucose, Bld: 145 mg/dL — ABNORMAL HIGH (ref 65–99)
Potassium: 5 mmol/L (ref 3.5–5.3)
Sodium: 138 mmol/L (ref 135–146)
Total Bilirubin: 1.2 mg/dL (ref 0.2–1.2)
Total Protein: 6.6 g/dL (ref 6.1–8.1)
eGFR: 45 mL/min/{1.73_m2} — ABNORMAL LOW (ref >=60)

## 2021-07-06 LAB — LIPID PANEL
Cholesterol: 104 mg/dL (ref <200)
HDL: 33 mg/dL — ABNORMAL LOW (ref >=40)
LDL Cholesterol (Calc): 51 mg/dL (calc)
Non-HDL Cholesterol (Calc): 71 mg/dL (calc) (ref <130)
Total CHOL/HDL Ratio: 3.2 (calc) (ref <5.0)
Triglycerides: 110 mg/dL (ref <150)

## 2021-07-06 NOTE — Progress Notes (Signed)
Hi Tristan Mayer, your cholesterol looks good.  Kidney function is stable at 1.5.

## 2021-07-14 ENCOUNTER — Other Ambulatory Visit: Payer: Self-pay | Admitting: Family Medicine

## 2021-07-17 ENCOUNTER — Other Ambulatory Visit: Payer: Self-pay | Admitting: Family Medicine

## 2021-07-17 DIAGNOSIS — N401 Enlarged prostate with lower urinary tract symptoms: Secondary | ICD-10-CM

## 2021-07-17 IMAGING — DX DG KNEE COMPLETE 4+V*R*
4 series · 4 of 4 positions shown · non-contrast
Comparison: 01/03/2018

CLINICAL DATA: Chronic bilateral knee pain

EXAM:
RIGHT KNEE - COMPLETE 4+ VIEW; LEFT KNEE - COMPLETE 4+ VIEW

[tunnel]
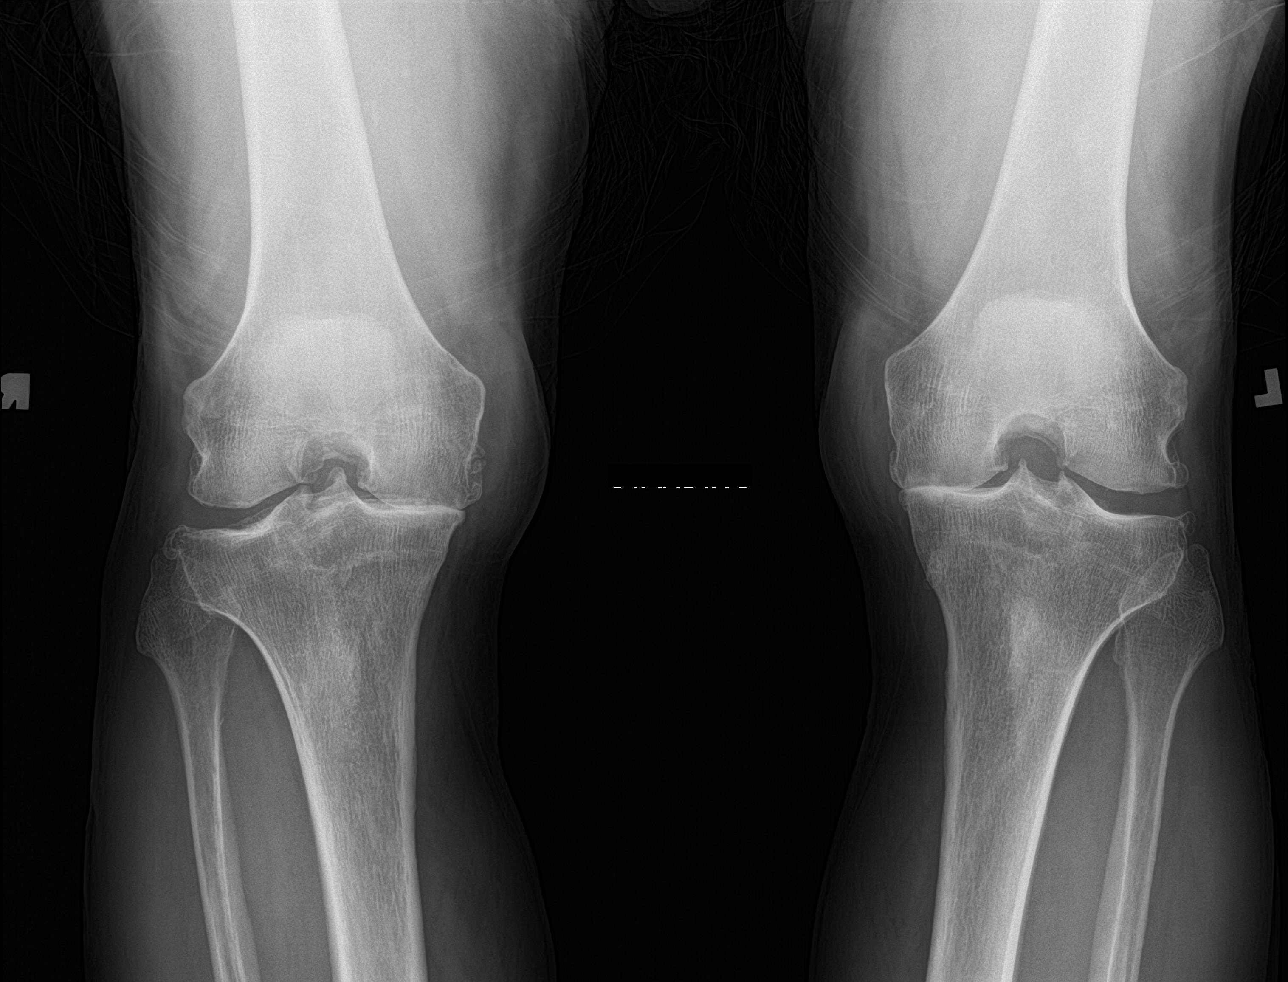

[knee lat]
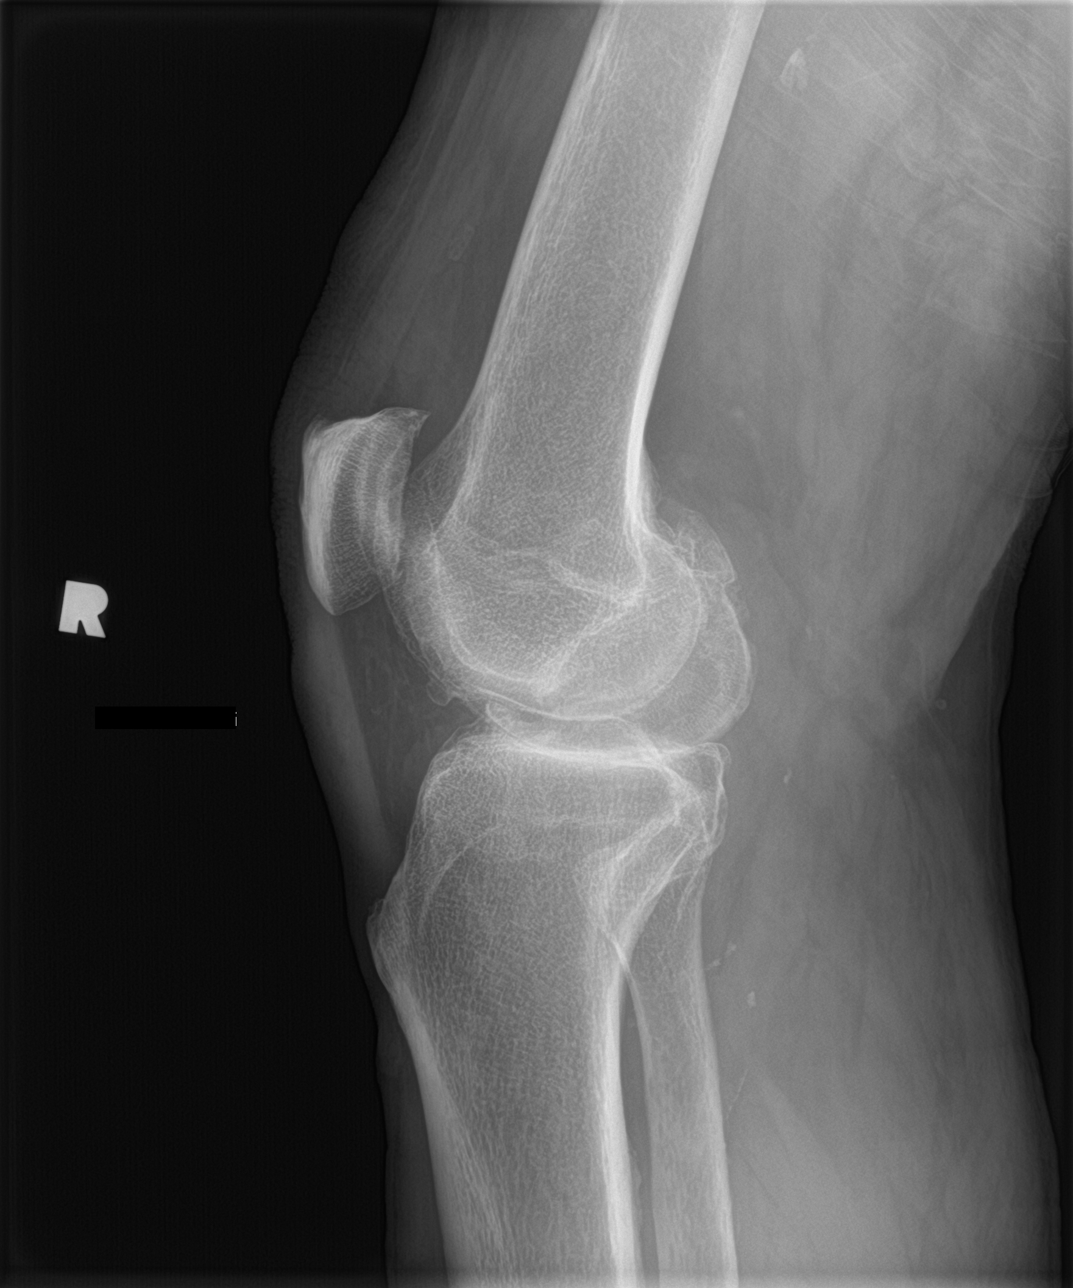

[knee sunrise]
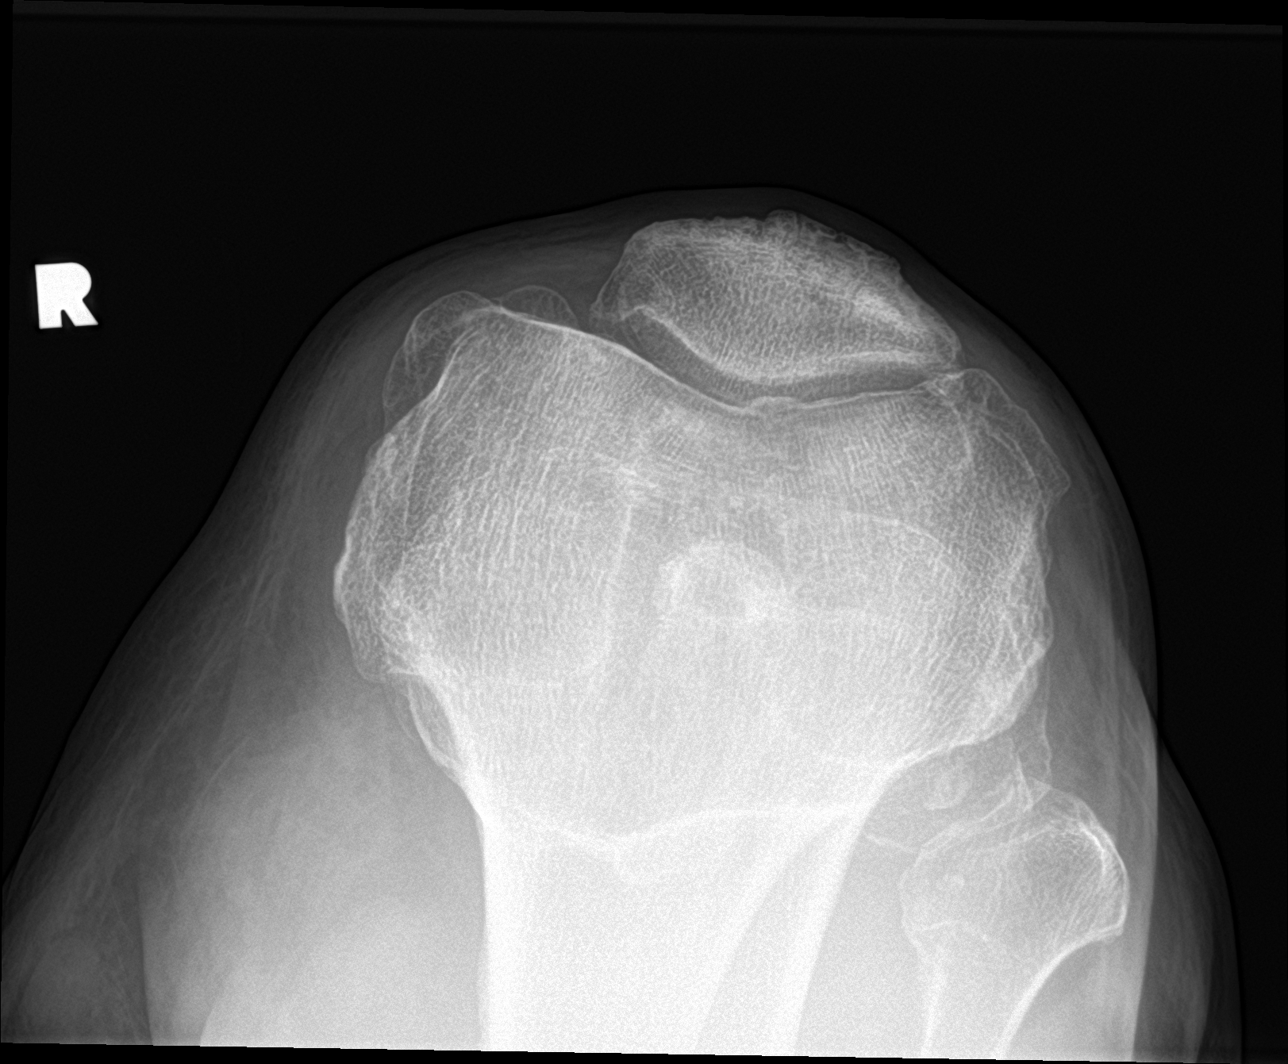

[knee ap bilat standing]
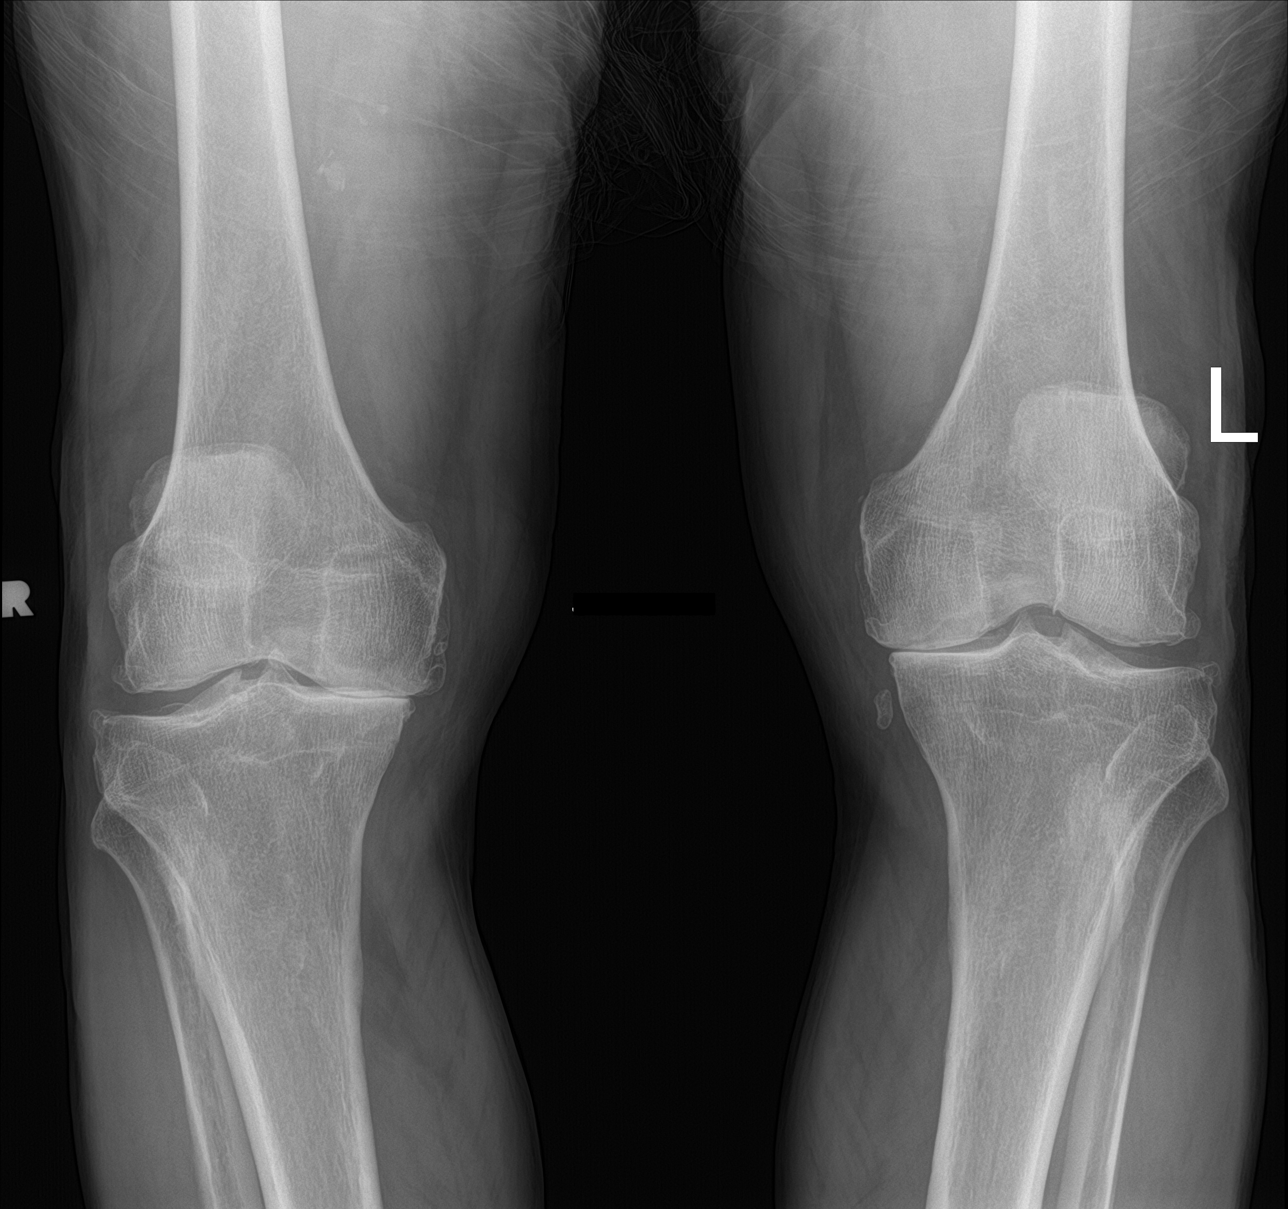

[4 of 4 positions shown; findings below may reference images not displayed]

FINDINGS: No acute fracture or dislocation. Tricompartmental osteoarthritis of
the bilateral knees, which is bone-on-bone within the medial
compartments. Small bilateral knee joint effusions. Rounded 1.2 cm
mineralized density posterior to the left knee joint, likely
reflecting a loose body within a Baker's cyst. Scattered vascular
calcifications.
IMPRESSION: 1. Tricompartmental osteoarthritis of the bilateral knees, severe
within the medial compartments. Findings have slightly progressed
from prior.
2. Small bilateral knee joint effusions.
3. Suspect left Baker's cyst containing loose body.

## 2021-07-26 ENCOUNTER — Other Ambulatory Visit: Payer: Self-pay | Admitting: Family Medicine

## 2021-07-26 DIAGNOSIS — N401 Enlarged prostate with lower urinary tract symptoms: Secondary | ICD-10-CM

## 2021-08-02 ENCOUNTER — Other Ambulatory Visit: Payer: Self-pay | Admitting: Family Medicine

## 2021-08-11 ENCOUNTER — Telehealth: Payer: Self-pay | Admitting: Sports Medicine

## 2021-08-11 ENCOUNTER — Ambulatory Visit (INDEPENDENT_AMBULATORY_CARE_PROVIDER_SITE_OTHER): Payer: Medicare Other | Admitting: Sports Medicine

## 2021-08-11 DIAGNOSIS — M170 Bilateral primary osteoarthritis of knee: Secondary | ICD-10-CM

## 2021-08-11 NOTE — Telephone Encounter (Signed)
Bilateral knee osteoarthritis, has failed greater than 6 weeks of conservative treatment, x-ray confirmed osteoarthritis, has done analgesics, steroid injections, good response to Visco last year. ?Bilateral Orthovisc or any other viscosupplement approval please ?

## 2021-08-11 NOTE — Assessment & Plan Note (Signed)
Bilateral knee osteoarthritis, try to get him approved for viscosupplementation previously but the ball got dropped, he is still interested, his right knee is hurting a lot today, swollen, but not bad enough to consider steroid injection, he has had steroid injections and viscosupplementation in the past with good effect, he has done greater than 6 weeks of physician directed conservative treatment, he has had x-rays that confirm osteoarthritis. ?He has done analgesics. ?We will work on getting him approved for Orthovisc and we can start the injections at his leisure. ?

## 2021-08-11 NOTE — Telephone Encounter (Signed)
[employer] paperwork faxed to [employer] at [phone] ?Request is for Orthovisc or Monovisc ?Pt's insurance prefers Euflexxa ?Fax confirmation receipt received  ?

## 2021-08-11 NOTE — Progress Notes (Signed)
? ? ?  Procedures performed today:   ? ?None. ? ?Independent interpretation of notes and tests performed by another provider:  ? ?None. ? ?Brief History, Exam, Impression, and Recommendations:   ? ?Primary osteoarthritis of both knees ?Bilateral knee osteoarthritis, try to get him approved for viscosupplementation previously but the ball got dropped, he is still interested, his right knee is hurting a lot today, swollen, but not bad enough to consider steroid injection, he has had steroid injections and viscosupplementation in the past with good effect, he has done greater than 6 weeks of physician directed conservative treatment, he has had x-rays that confirm osteoarthritis. ?He has done analgesics. ?We will work on getting him approved for Orthovisc and we can start the injections at his leisure. ? ? ? ?___________________________________________ ?Gwen Her. Dianah Field, M.D., ABFM., CAQSM. ?Primary Care and Sports Medicine ?[place] ? ?Adjunct Instructor of Family Medicine  ?[employer] ?

## 2021-08-12 NOTE — Telephone Encounter (Signed)
Benefits Investigation Details received from [employer] ?Injection: Synvisc ? ?Medical: Deductible does not apply. Once the OOP has been met, patient is covered at 100%. Only one copy per date of service. Prior authorization is required for the drug through [place]. Step edits cannot be determined until PA is submitted.  ? ?PA required: Yes ?PA submitted using [facility] provider portal. Approved [id number] ? ?Pharmacy: The product is not covered under the pharmacy plan.  ? ?Specialty Pharmacy: [facility] ? ?May fill through: [place] ?OV Copay/Coinsurance:  ?Product Copay: $280 ?Administration Coinsurance:  ?Administration Copay: $30 ?Out of Pocket Max: $4500 (met: $14.49)   ? ?LVM for a return call from patient regarding gel injection.  ?

## 2021-08-16 NOTE — Telephone Encounter (Signed)
Spoke with patient about this responsibility for Orthovisc bilateral injections. He asked how much it would be for "just one knee". This cost was discussed with patient. He stated that he couldn't not afford this at this time and wouldn't not be able to move forward with treatment.  ?

## 2021-08-18 NOTE — Progress Notes (Signed)
? ? ?Referring-Catherine Metheney MD ?Reason for referral-paroxysmal atrial fibrillation and abnormal electrocardiogram ? ?HPI: 81 year old male for evaluation of paroxysmal atrial fibrillation and abnormal electrocardiogram at request of Beatrice Lecher MD.  Patient seen previously but not since May 2019. Patient had CVA March 2018 and was treated at [facility].  Cardiac CTA March 2018 showed ejection fraction 49%.  There was moderate stenosis in the LAD, moderate stenosis in the ramus and moderate stenosis in the right coronary artery.  Echocardiogram showed normal LV function.  Cardiac MRI May 2018 showed normal LV function, no significant valvular abnormalities.  Abdominal ultrasound May 2019 showed no aneurysm.  Patient denies dyspnea on exertion, orthopnea, PND, pedal edema, chest pain, palpitations, syncope or bleeding. ? ?Current Outpatient Medications  ?Medication Sig Dispense Refill  ? acetaminophen (TYLENOL) 650 MG CR tablet Take 1 tablet (650 mg total) by mouth every 8 (eight) hours as needed for pain. 90 tablet 3  ? Blood Glucose Calibration (ACCU-CHEK GUIDE CONTROL) LIQD     ? blood glucose meter kit and supplies Dispense based on patient and insurance preference. Use up to four times daily as directed. Dx: E11.8, E11.65 1 each 0  ? Blood Glucose Monitoring Suppl (ONETOUCH VERIO REFLECT) w/Device KIT 1 each by Does not apply route 2 (two) times daily. Dx: E11.8, E11.65 - Check fasting blood sugar every morning and once 2 hours after largest meal of the day. 1 kit prn  ? carvedilol (COREG) 12.5 MG tablet Take 1 tablet (12.5 mg total) by mouth 2 (two) times daily with a meal. (Patient taking differently: Take 12.5 mg by mouth daily.) 60 tablet 5  ? Co-Enzyme Q-10 30 MG CAPS Take 400 mg by mouth daily.    ? finasteride (PROSCAR) 5 MG tablet TAKE 1 TABLET BY MOUTH AT BEDTIME 90 tablet 0  ? gabapentin (NEURONTIN) 300 MG capsule TAKE 2 CAPSULES BY MOUTH IN THE MORNING AND 3 IN  THE EVENING 450 capsule 0  ? glucose blood (ONETOUCH VERIO) test strip Dx: E11.8, E11.65 - Check fasting blood sugar every morning and once 2 hours after largest meal of the day. 100 each 12  ? insulin degludec (TRESIBA FLEXTOUCH) 100 UNIT/ML FlexTouch Pen INJECT 65 TO 80 UNITS SUBCUTANEOUSLY ONCE DAILY .  KEEP  APPT  FOR  REFILLS 24 mL 3  ? losartan (COZAAR) 25 MG tablet Take 1 tablet by mouth once daily 90 tablet 0  ? metFORMIN (GLUCOPHAGE) 1000 MG tablet TAKE 1 TABLET BY MOUTH TWICE DAILY WITH MEALS 180 tablet 0  ? rosuvastatin (CRESTOR) 40 MG tablet Take 1 tablet (40 mg total) by mouth at bedtime. 90 tablet 3  ? Semaglutide, 1 MG/DOSE, 4 MG/3ML SOPN Inject 1 mg as directed once a week. 3 mL 2  ? sertraline (ZOLOFT) 100 MG tablet Take 1 tablet by mouth once daily 90 tablet 0  ? tamsulosin (FLOMAX) 0.4 MG CAPS capsule Take 1 capsule by mouth twice daily 180 capsule 0  ? Ultra Thin Lancets 31G MISC     ? XARELTO 20 MG TABS tablet TAKE 1 TABLET BY MOUTH ONCE DAILY WITH SUPPER 90 tablet 3  ? ?No current facility-administered medications for this visit.  ? ? ?Allergies  ?Allergen Reactions  ? Statins Other (See Comments)  ?  Other reaction(s): Other ?All statin drugs ?Leg cramps when he was on atorvastatin and fenofibrate simultaneously. Discussed with his PCP Dr. Remus Blake on the phone [phone]) ?All statin drugs ?Leg cramps when he was on  atorvastatin and fenofibrate simultaneously. Discussed with his PCP Dr. Remus Blake on the phone [phone]) ?All statin drugs ?  ? Diltiazem Other (See Comments)  ?  Chest pain  ? Eliquis [Apixaban] Other (See Comments)  ?  Feels weak   ? Lisinopril Other (See Comments)  ?  Weakness  ? ? ? ?Past Medical History:  ?Diagnosis Date  ? Atrial fibrillation ([place])   ? CVA (cerebral vascular accident) [facility])   ? Diabetes mellitus ([place])   ? GERD (gastroesophageal reflux disease)   ? Hyperlipidemia   ? PUD (peptic ulcer disease)   ? ? ?Past Surgical History:  ?Procedure Laterality Date  ?  CATARACT EXTRACTION W/ INTRAOCULAR LENS IMPLANT Right   ? ? ?Social History  ? ?Socioeconomic History  ? Marital status: Married  ?  Spouse name: [name]  ? Number of children: 3  ? Years of education: 12th Grade  ? Highest education level: 12th grade  ?Occupational History  ? Occupation: Retired  ?Tobacco Use  ? Smoking status: Some Days  ?  Types: Cigars  ? Smokeless tobacco: Never  ?Substance and Sexual Activity  ? Alcohol use: Yes  ?  Comment: Occasional  ? Drug use: Never  ? Sexual activity: Not on file  ?Other Topics Concern  ? Not on file  ?Social History Narrative  ? Lives with his wife. He enjoys playing golf three times a week.  ? ?Social Determinants of Health  ? ?Financial Resource Strain: Not on file  ?Food Insecurity: Not on file  ?Transportation Needs: Not on file  ?Physical Activity: Not on file  ?Stress: Not on file  ?Social Connections: Not on file  ?Intimate Partner Violence: Not on file  ? ? ?Family History  ?Problem Relation Age of Onset  ? Prostate cancer Father 57  ? Stroke Father   ? Diabetes Brother   ? Diabetes Brother   ? Atrial fibrillation Mother   ? ? ?ROS: no fevers or chills, productive cough, hemoptysis, dysphasia, odynophagia, melena, hematochezia, dysuria, hematuria, rash, seizure activity, orthopnea, PND, pedal edema, claudication. Remaining systems are negative. ? ?Physical Exam:  ? ?Blood pressure 138/60, pulse 72, height 5' 10" (1.778 m), weight 243 lb (110.2 kg). ? ?General:  Well developed/well nourished in NAD ?Skin warm/dry ?Patient not depressed ?No peripheral clubbing ?Back-normal ?HEENT-normal/normal eyelids ?Neck supple/normal carotid upstroke bilaterally; no bruits; no JVD; no thyromegaly ?chest - CTA/ normal expansion ?CV - RRR/normal S1 and S2; no murmurs, rubs or gallops;  PMI nondisplaced ?Abdomen -NT/ND, no HSM, no mass, + bowel sounds, no bruit ?2+ femoral pulses, no bruits ?Ext-no edema, chords, 2+ DP ?Neuro-grossly nonfocal ? ?ECG -July 05, 2021-normal sinus  rhythm, first-degree AV block, occasional PVC, right bundle branch block.  Personally reviewed ? ?Electrocardiogram today shows sinus rhythm with PVCs, right bundle branch block. ? ?A/P ? ?1 paroxysmal atrial fibrillation-patient is in sinus rhythm today.  We will continue Cardizem at present dose.  Continue xarelto.  Note LV function normal on previous echocardiogram. ? ?2 coronary artery disease-moderate on outside CTA.  He is not having chest pain.  Plan to continue medical therapy.  Continue statin.  No aspirin given need for apixaban. ? ?3 hyperlipidemia-continue statin. ? ?4 abnormal electrocardiogram-LV function is normal on echocardiogram.  No plans for further evaluation at this point. ? ?Kirk Ruths, MD ? ? ? ? ?

## 2021-08-23 ENCOUNTER — Ambulatory Visit: Payer: Medicare Other | Admitting: Cardiology

## 2021-08-23 ENCOUNTER — Encounter: Payer: Self-pay | Admitting: Cardiology

## 2021-08-23 VITALS — BP 138/60 | HR 72 | Ht 70.0 in | Wt 243.0 lb

## 2021-08-23 DIAGNOSIS — I2510 Atherosclerotic heart disease of native coronary artery without angina pectoris: Secondary | ICD-10-CM | POA: Diagnosis not present

## 2021-08-23 DIAGNOSIS — I10 Essential (primary) hypertension: Secondary | ICD-10-CM | POA: Diagnosis not present

## 2021-08-23 DIAGNOSIS — I480 Paroxysmal atrial fibrillation: Secondary | ICD-10-CM | POA: Diagnosis not present

## 2021-08-23 NOTE — Patient Instructions (Signed)

## 2021-08-25 ENCOUNTER — Other Ambulatory Visit: Payer: Self-pay | Admitting: Family Medicine

## 2021-09-02 ENCOUNTER — Other Ambulatory Visit: Payer: Self-pay | Admitting: Family Medicine

## 2021-09-13 ENCOUNTER — Other Ambulatory Visit: Payer: Self-pay | Admitting: Family Medicine

## 2021-09-27 ENCOUNTER — Other Ambulatory Visit: Payer: Self-pay | Admitting: Family Medicine

## 2021-09-27 DIAGNOSIS — E1165 Type 2 diabetes mellitus with hyperglycemia: Secondary | ICD-10-CM

## 2021-10-05 ENCOUNTER — Ambulatory Visit (INDEPENDENT_AMBULATORY_CARE_PROVIDER_SITE_OTHER): Payer: Medicare Other | Admitting: Family Medicine

## 2021-10-05 ENCOUNTER — Encounter: Payer: Self-pay | Admitting: Family Medicine

## 2021-10-05 VITALS — BP 115/61 | HR 79 | Resp 18 | Ht 70.0 in | Wt 237.0 lb

## 2021-10-05 DIAGNOSIS — I480 Paroxysmal atrial fibrillation: Secondary | ICD-10-CM | POA: Diagnosis not present

## 2021-10-05 DIAGNOSIS — E1165 Type 2 diabetes mellitus with hyperglycemia: Secondary | ICD-10-CM

## 2021-10-05 DIAGNOSIS — Z794 Long term (current) use of insulin: Secondary | ICD-10-CM

## 2021-10-05 DIAGNOSIS — E118 Type 2 diabetes mellitus with unspecified complications: Secondary | ICD-10-CM

## 2021-10-05 DIAGNOSIS — I10 Essential (primary) hypertension: Secondary | ICD-10-CM

## 2021-10-05 LAB — POCT GLYCOSYLATED HEMOGLOBIN (HGB A1C): Hemoglobin A1C: 7.3 % — AB (ref 4.0–5.6)

## 2021-10-05 MED ORDER — CARVEDILOL 6.25 MG PO TABS: 6.2500 mg | ORAL_TABLET | Freq: Two times a day (BID) | ORAL | 1 refills | Status: DC

## 2021-10-05 MED ORDER — SEMAGLUTIDE (2 MG/DOSE) 8 MG/3ML ~~LOC~~ SOPN: 2.0000 mg | PEN_INJECTOR | SUBCUTANEOUS | 1 refills | Status: DC

## 2021-10-05 MED ORDER — SERTRALINE HCL 100 MG PO TABS: 100.0000 mg | ORAL_TABLET | Freq: Every day | ORAL | 1 refills | Status: DC

## 2021-10-05 NOTE — Progress Notes (Signed)
Established Patient Office Visit  Subjective   Patient ID: Tristan Mayer, male    DOB: [birth year]  Age: 81 y.o. MRN: [id number]  Chief Complaint  Patient presents with   Diabetes    Follow up    Hypertension    Follow up. Patient states he has been off of Losartan for 2 months. Patient states he is only taking Carvedilol one time daily.     HPI  Diabetes - no hypoglycemic events. No wounds or sores that are not healing well. No increased thirst or urination. Checking glucose at home. Taking medications as prescribed without any side effects. Still using 68 units of Tresiba.    Hypertension- Pt denies chest pain, SOB, dizziness, or heart palpitations.  Taking meds as directed w/o problems.  Denies medication side effects.    Follow-up atrial fibrillation-he says he is only taking his carvedilol once a day because it was making him very tired so he just takes it at bedtime.  Actually just saw Dr. Stanford Breed at the end of April.  He has not.     ROS    Objective:     BP 115/61   Pulse 79   Resp 18   Ht '5\' 10"'$  (1.778 m)   Wt 237 lb (107.5 kg)   SpO2 95%   BMI 34.01 kg/m    Physical Exam Constitutional:      Appearance: He is well-developed.  HENT:     Head: Normocephalic and atraumatic.  Cardiovascular:     Rate and Rhythm: Normal rate.     Heart sounds: Normal heart sounds.     Comments: 3/6 SEM, irregular rhythm, rate controlled.  Pulmonary:     Effort: Pulmonary effort is normal.     Breath sounds: Normal breath sounds.  Skin:    General: Skin is warm and dry.  Neurological:     Mental Status: He is alert and oriented to person, place, and time.  Psychiatric:        Behavior: Behavior normal.     Results for orders placed or performed in visit on 10/05/21  POCT glycosylated hemoglobin (Hb A1C)  Result Value Ref Range   Hemoglobin A1C 7.3 (A) 4.0 - 5.6 %   HbA1c POC (<> result, manual entry)     HbA1c, POC (prediabetic range)     HbA1c, POC (controlled  diabetic range)        The ASCVD Risk score (Arnett DK, et al., 2019) failed to calculate for the following reasons:   The patient has a prior MI or stroke diagnosis    Assessment & Plan:   Problem List Items Addressed This Visit       Cardiovascular and Mediastinum   Paroxysmal A-fib ([place])     We will decrease his carvedilol to 6.25 mg and have him try to take that twice a day instead of just taking the 12-1/2 once a day.      Relevant Medications   carvedilol (COREG) 6.25 MG tablet   Benign essential hypertension    Well controlled.  We will decrease his carvedilol to 6.25 mg and have him try to take that twice a day instead of just taking the 12-1/2 once a day.  If it causes significant fatigue he can let me know.  He also has not been taking his losartan.  So if he restarts that we manages to monitor his blood pressure carefully.  It is a little on the low side today but it was actually  in the upper 130s several weeks ago.  Follow up in  42mo       Relevant Medications   carvedilol (COREG) 6.25 MG tablet     Endocrine   Controlled diabetes mellitus type 2 with complications ([facility]    Plan will be to increase Ozempic to 2 mg in 1 month.  He has noticed the curbing of the appetite and he is down to 237 pounds which is fantastic.  My hope is that if we go up we will actually be able to reduce his insulin which would reduce his risk for hypoglycemia.  He is currently on 68 units of Tresiba I am hopeful we might be able to at least get him down by 20 units.  Continue to work on [employer]which she does struggle with.  And continue to stay active.       Relevant Medications   Semaglutide, 2 MG/DOSE, 8 MG/3ML SOPN    Return in about 3 months (around 01/10/2022) for Diabetes follow-up, Hypertension.    CBeatrice Lecher MD

## 2021-10-05 NOTE — Assessment & Plan Note (Addendum)
Well controlled.  We will decrease his carvedilol to 6.25 mg and have him try to take that twice a day instead of just taking the 12-1/2 once a day.  If it causes significant fatigue he can let me know.  He also has not been taking his losartan.  So if he restarts that we manages to monitor his blood pressure carefully.  It is a little on the low side today but it was actually in the upper 130s several weeks ago.  Follow up in  50mo

## 2021-10-05 NOTE — Assessment & Plan Note (Signed)
Plan will be to increase Ozempic to 2 mg in 1 month.  He has noticed the curbing of the appetite and he is down to 237 pounds which is fantastic.  My hope is that if we go up we will actually be able to reduce his insulin which would reduce his risk for hypoglycemia.  He is currently on 68 units of Tresiba I am hopeful we might be able to at least get him down by 20 units.  Continue to work on [employer] which she does struggle with.  And continue to stay active.

## 2021-10-05 NOTE — Assessment & Plan Note (Signed)
We will decrease his carvedilol to 6.25 mg and have him try to take that twice a day instead of just taking the 12-1/2 once a day.

## 2021-10-07 ENCOUNTER — Other Ambulatory Visit: Payer: Self-pay | Admitting: Family Medicine

## 2021-10-18 ENCOUNTER — Other Ambulatory Visit: Payer: Self-pay | Admitting: Family Medicine

## 2021-10-20 ENCOUNTER — Ambulatory Visit (INDEPENDENT_AMBULATORY_CARE_PROVIDER_SITE_OTHER): Payer: Medicare Other | Admitting: Sports Medicine

## 2021-10-20 ENCOUNTER — Ambulatory Visit (INDEPENDENT_AMBULATORY_CARE_PROVIDER_SITE_OTHER): Payer: Medicare Other

## 2021-10-20 DIAGNOSIS — M170 Bilateral primary osteoarthritis of knee: Secondary | ICD-10-CM

## 2021-10-20 NOTE — Progress Notes (Addendum)
    Procedures performed today:    Procedure: Real-time Ultrasound Guided injection of the left knee Device: Samsung HS60  Verbal informed consent obtained.  Time-out conducted.  Noted no overlying erythema, induration, or other signs of local infection.  Skin prepped in a sterile fashion.  Local anesthesia: Topical Ethyl chloride.  With sterile technique and under real time ultrasound guidance: Noted mild effusion, 1 cc Depo-Medrol 40, 2 cc lidocaine, 2 cc bupivacaine injected easily Completed without difficulty  Advised to call if fevers/chills, erythema, induration, drainage, or persistent bleeding.  Images permanently stored and available for review in PACS.  Impression: Technically successful ultrasound guided injection.   Procedure: Real-time Ultrasound Guided injection of the right knee Device: Samsung HS60  Verbal informed consent obtained.  Time-out conducted.  Noted no overlying erythema, induration, or other signs of local infection.  Skin prepped in a sterile fashion.  Local anesthesia: Topical Ethyl chloride.  With sterile technique and under real time ultrasound guidance: Noted mild effusion, 1 cc Depo-Medrol 40, 2 cc lidocaine, 2 cc bupivacaine injected easily Completed without difficulty  Advised to call if fevers/chills, erythema, induration, drainage, or persistent bleeding.  Images permanently stored and available for review in PACS.  Impression: Technically successful ultrasound guided injection.  Independent interpretation of notes and tests performed by another provider:   None.  Brief History, Exam, Impression, and Recommendations:    Primary osteoarthritis of both knees Pleasant 81 year old male, known bilateral knee osteoarthritis, historically he has not been able to get approved for viscosupplementation but does have [place] healthcare and Medicare now, so we will try again. He did not get much relief from a triamcinolone injection. Today we will try  Depo-Medrol bilaterally and to get him approved again for Orthovisc. He will need some updated x-rays. Return to start Orthovisc if/when approved.  Chronic process with exacerbation and pharmacologic intervention  ___________________________________________ Gwen Her. Dianah Field, M.D., ABFM., CAQSM. Primary Care and [place] Instructor of [employer]

## 2021-10-20 NOTE — Telephone Encounter (Signed)
[employer] paperwork faxed to [employer] at [phone] Request is for Orthovisc Pt's insurance prefers Orthovisc Fax confirmation receipt received  

## 2021-10-20 NOTE — Telephone Encounter (Signed)
Cecilie Lowers now has new insurance, [place] healthcare/Medicare, so please try to get Orthovisc approved again, bilateral, I just ordered new x-rays, has failed greater than 6 weeks of conditioning, analgesics, steroid injections.

## 2021-10-20 NOTE — Assessment & Plan Note (Signed)
Pleasant 81 year old male, known bilateral knee osteoarthritis, historically he has not been able to get approved for viscosupplementation but does have [place] healthcare and Medicare now, so we will try again. He did not get much relief from a triamcinolone injection. Today we will try Depo-Medrol bilaterally and to get him approved again for Orthovisc. He will need some updated x-rays. Return to start Orthovisc if/when approved.

## 2021-10-22 ENCOUNTER — Other Ambulatory Visit: Payer: Self-pay

## 2021-10-22 DIAGNOSIS — M170 Bilateral primary osteoarthritis of knee: Secondary | ICD-10-CM

## 2021-10-22 MED ORDER — SYNVISC 16 MG/2ML IX SOSY: PREFILLED_SYRINGE | INTRA_ARTICULAR | 1 refills | Status: DC

## 2021-10-22 NOTE — Progress Notes (Signed)
Please complete the prescription for bilateral injections and send to specialty pharmacy.

## 2021-11-02 ENCOUNTER — Other Ambulatory Visit: Payer: Self-pay

## 2021-11-02 ENCOUNTER — Emergency Department (INDEPENDENT_AMBULATORY_CARE_PROVIDER_SITE_OTHER): Payer: Medicare Other

## 2021-11-02 ENCOUNTER — Encounter: Payer: Self-pay | Admitting: Emergency Medicine

## 2021-11-02 ENCOUNTER — Emergency Department
Admission: EM | Admit: 2021-11-02 | Discharge: 2021-11-02 | Disposition: A | Discharge status: Home / Self Care | Payer: Medicare Other | Source: Home / Self Care | Attending: Family Medicine | Admitting: Family Medicine

## 2021-11-02 DIAGNOSIS — M1612 Unilateral primary osteoarthritis, left hip: Secondary | ICD-10-CM | POA: Diagnosis not present

## 2021-11-02 DIAGNOSIS — W19XXXA Unspecified fall, initial encounter: Secondary | ICD-10-CM

## 2021-11-02 DIAGNOSIS — M25552 Pain in left hip: Secondary | ICD-10-CM | POA: Diagnosis not present

## 2021-11-02 DIAGNOSIS — M79605 Pain in left leg: Secondary | ICD-10-CM

## 2021-11-02 MED ORDER — BACLOFEN 10 MG PO TABS: ORAL_TABLET | ORAL | 0 refills | Status: DC

## 2021-11-02 MED ORDER — PREDNISONE 20 MG PO TABS: 20.0000 mg | ORAL_TABLET | Freq: Two times a day (BID) | ORAL | 0 refills | Status: DC

## 2021-11-02 NOTE — ED Provider Notes (Signed)
[facility]    CSN: [id number] Arrival date & time: 11/02/21  1747      History   Chief Complaint Chief Complaint  Patient presents with   Fall    HPI Tristan Mayer is a 81 y.o. male.   HPI Patient is here for hip pain after a fall.  He states that he was off balance because he lifted his right leg to step on a spider and then fell over towards the right side off balance.  He states he landed mostly on his right buttock but has pain is been in his left hip, going down the back of leg to the knee.  Pain with weightbearing.  States pain with certain movements.  It has not gotten better over a week.  He has not taken any medication for the pain.  Patient has multiple medical problems including heart disease obesity atherosclerosis osteoarthritis diabetes, hypertension, hyperlipidemia, stroke.  He did not fall because he had any kind of dizzy spell, faint, palpitations, or medical complication. He has arthritis and problems with his knee knees, but does not usually suffer from hip pain. Diabetes is well controlled with a hemoglobin A1c of 7.3 Past Medical History:  Diagnosis Date   Atrial fibrillation ([place])    CVA (cerebral vascular accident) ([place])    Diabetes mellitus ([place])    GERD (gastroesophageal reflux disease)    Hyperlipidemia    PUD (peptic ulcer disease)     Patient Active Problem List   Diagnosis Date Noted   AV block, 1st degree 07/05/2021   Idiopathic peripheral neuropathy 09/16/2019   BMI 36.0-36.9,adult 03/26/2019   Obesity, Class I, BMI 30.0-34.9 (see actual BMI) 08/22/2018   Morbid obesity ([place]) 05/23/2018   Aortic atherosclerosis ([place]) 03/12/2018   Primary osteoarthritis of both knees 01/03/2018   BPH (benign prostatic hyperplasia) 07/10/2017   INO (internuclear ophthalmoplegia), left 07/03/2017   Hearing loss 07/03/2017   ED (erectile dysfunction) 07/03/2017   Paroxysmal A-fib ([place]) 12/12/2016   Acute ischemic vertebrobasilar artery brainstem  stroke involving left-sided vessel ([place]) 12/09/2016   Thrombsis of left atrial appendage without antecedent myocardial infarction 12/09/2016   Controlled diabetes mellitus type 2 with complications ([place]) 71/24/5809   Other and unspecified hyperlipidemia 05/20/2008   Sleep apnea 07/19/2007   Allergic rhinitis 01/07/2007   Benign essential hypertension 01/07/2007   Gout, unspecified 01/07/2007    Past Surgical History:  Procedure Laterality Date   CATARACT EXTRACTION W/ INTRAOCULAR LENS IMPLANT Right        Home Medications    Prior to Admission medications   Medication Sig Start Date End Date Taking? Authorizing Provider  baclofen (LIORESAL) 10 MG tablet Muscle relaxer.  Take 1 or 2 at bedtime 11/02/21  Yes Raylene Everts, MD  predniSONE (DELTASONE) 20 MG tablet Take 1 tablet (20 mg total) by mouth 2 (two) times daily with a meal. 11/02/21  Yes Raylene Everts, MD  acetaminophen (TYLENOL) 650 MG CR tablet Take 1 tablet (650 mg total) by mouth every 8 (eight) hours as needed for pain. 06/23/20   Silverio Decamp, MD  Blood Glucose Calibration (ACCU-CHEK GUIDE CONTROL) LIQD  08/13/18   [provider]  blood glucose meter kit and supplies Dispense based on patient and insurance preference. Use up to four times daily as directed. Dx: E11.8, E11.65 06/13/19   Hali Marry, MD  Blood Glucose Monitoring Suppl [facility] VERIO REFLECT) w/Device KIT 1 each by Does not apply route 2 (two) times daily. Dx:  E11.8, E11.65 - Check fasting blood sugar every morning and once 2 hours after largest meal of the day. 06/14/19   Hali Marry, MD  carvedilol (COREG) 6.25 MG tablet Take 1 tablet (6.25 mg total) by mouth 2 (two) times daily with a meal. 10/05/21   Hali Marry, MD  Co-Enzyme Q-10 30 MG CAPS Take 400 mg by mouth daily.    [provider]  finasteride (PROSCAR) 5 MG tablet TAKE 1 TABLET BY MOUTH AT BEDTIME 07/19/21   Hali Marry, MD   gabapentin (NEURONTIN) 300 MG capsule TAKE 2 CAPSULES BY MOUTH IN THE MORNING AND 3 IN THE EVENING 09/28/21   Hali Marry, MD  glucose blood (ONETOUCH VERIO) test strip Dx: E11.8, E11.65 - Check fasting blood sugar every morning and once 2 hours after largest meal of the day. 02/17/21   Hali Marry, MD  hylan (SYNVISC) 16 MG/2ML SOSY intra-articular injection Inject one syringe into each knee weekly x3 10/22/21   Silverio Decamp, MD  insulin degludec (TRESIBA FLEXTOUCH) 100 UNIT/ML FlexTouch Pen INJECT 65 TO 80 UNITS SUBCUTANEOUSLY ONCE DAILY .  KEEP  APPT  FOR  REFILLS 08/02/21   Hali Marry, MD  losartan (COZAAR) 25 MG tablet Take 1 tablet by mouth once daily 10/07/21   Hali Marry, MD  metFORMIN (GLUCOPHAGE) 1000 MG tablet TAKE 1 TABLET BY MOUTH TWICE DAILY WITH MEALS . 10/18/21   Hali Marry, MD  rosuvastatin (CRESTOR) 40 MG tablet Take 1 tablet (40 mg total) by mouth at bedtime. 06/02/21   Hali Marry, MD  Semaglutide, 2 MG/DOSE, 8 MG/3ML SOPN Inject 2 mg as directed once a week. 10/05/21   Hali Marry, MD  sertraline (ZOLOFT) 100 MG tablet Take 1 tablet (100 mg total) by mouth daily. 10/05/21   Hali Marry, MD  tamsulosin [facility]) 0.4 MG CAPS capsule Take 1 capsule by mouth twice daily 10/18/21   Hali Marry, MD  Ultra Thin Lancets 31G MISC  08/13/18   [provider]  XARELTO 20 MG TABS tablet TAKE 1 TABLET BY MOUTH ONCE DAILY WITH SUPPER 02/04/21   Hali Marry, MD    Family History Family History  Problem Relation Age of Onset   Prostate cancer Father 38   Stroke Father    Diabetes Brother    Diabetes Brother    Atrial fibrillation Mother     Social History Social History   Tobacco Use   Smoking status: Some Days    Types: Cigars   Smokeless tobacco: Never   Tobacco comments:    Cigar only when playing golf   Vaping Use   Vaping Use: Never used  Substance Use Topics    Alcohol use: Yes    Comment: Occasional   Drug use: Never     Allergies   Statins, Diltiazem, Eliquis [apixaban], and Lisinopril   Review of Systems Review of Systems See HPI  Physical Exam Triage Vital Signs ED Triage Vitals  Enc Vitals Group     BP 11/02/21 1800 111/74     Pulse Rate 11/02/21 1800 81     Resp 11/02/21 1800 18     Temp 11/02/21 1800 97.9 F (36.6 C)     Temp Source 11/02/21 1800 Oral     SpO2 11/02/21 1800 94 %     Weight 11/02/21 1801 240 lb (108.9 kg)     Height 11/02/21 1801 5' 10"  (1.778 m)  Head Circumference --      Peak Flow --      Pain Score 11/02/21 1801 8     Pain Loc --      Pain Edu? --      Excl. in [place]? --    No data found.  Updated Vital Signs BP 111/74 (BP Location: Left Arm)   Pulse 81   Temp 97.9 F (36.6 C) (Oral)   Resp 18   Ht 5' 10"  (1.778 m)   Wt 108.9 kg   SpO2 94%   BMI 34.44 kg/m       Physical Exam Constitutional:      General: He is not in acute distress.    Appearance: He is well-developed.     Comments: Truncal obesity.  Very pleasant  HENT:     Head: Normocephalic and atraumatic.  Eyes:     Conjunctiva/sclera: Conjunctivae normal.     Pupils: Pupils are equal, round, and reactive to light.  Cardiovascular:     Rate and Rhythm: Normal rate.  Pulmonary:     Effort: Pulmonary effort is normal. No respiratory distress.  Abdominal:     General: There is no distension.     Palpations: Abdomen is soft.  Musculoskeletal:        General: Normal range of motion.     Cervical back: Normal range of motion.     Comments: No bruising identified.  No tenderness in the lumbar spine, centrally, however both SI joints or the posterior pelvis.  No tenderness over the greater trochanter bilaterally over the anterior hip joint.  Patient has pain with weightbearing on the left side.  He can straight leg raise without any pain.  When he flexes his hip he experiences some pain.  No pain with internal or external  rotation.  No leg length shortening.  No visible bruising over the hamstring.  No tenderness over the hamstring, ischial tuberosity to popliteal fossa  Skin:    General: Skin is warm and dry.  Neurological:     General: No focal deficit present.     Mental Status: He is alert.     Gait: Gait abnormal.  Psychiatric:        Mood and Affect: Mood normal.      UC Treatments / Results  Labs (all labs ordered are listed, but only abnormal results are displayed) Labs Reviewed - No data to display  EKG   Radiology DG Hip Unilat W or Wo Pelvis 2-3 Views Left  Result Date: 11/02/2021 CLINICAL DATA:  Fall 1 week ago with hip pain. Patient reports buttock pain radiating to the knee. EXAM: DG HIP (WITH OR WITHOUT PELVIS) 2-3V LEFT COMPARISON:  None Available. FINDINGS: No acute fracture of the pelvis or left hip. Pubic rami are intact. Pubic symphysis and sacroiliac joints are congruent. Moderately advanced left hip osteoarthritis with joint space narrowing and acetabular spurring. Osseous bump at the femoral head neck junction. IMPRESSION: 1. No fracture of the pelvis or left hip. 2. Moderately advanced left hip osteoarthritis. Electronically Signed   By: Keith Rake M.D.   On: 11/02/2021 18:39    Procedures Procedures (including critical care time)  Medications Ordered in UC Medications - No data to display  Initial Impression / Assessment and Plan / UC Course  I have reviewed the triage vital signs and the nursing notes.  Pertinent labs & imaging results that were available during my care of the patient were reviewed by me and considered in  my medical decision making (see chart for details).     X-rays are negative.  I am going to treat conservatively with steroids.  I am not comfortable using NSAID drugs with his low GFR.  We will also get a mild muscle relaxer to take at bedtime.  Recommend warm compresses.  He needs follow-up with Dr. Leonarda Salon fails to improve Final Clinical  Impressions(s) / UC Diagnoses   Final diagnoses:  Primary osteoarthritis of left hip  Fall, initial encounter  Left leg pain     Discharge Instructions       Take the prednisone as directed.  This is an anti-inflammatory medicine. Take 1 or 2 muscle relaxers at bedtime.  This will help you rest and help to relax this injured muscle Use warm to the area for 20 minutes every couple of hours See Dr. Darene Lamer if not improving by the end of the week     ED Prescriptions     Medication Sig Dispense Auth. Provider   predniSONE (DELTASONE) 20 MG tablet Take 1 tablet (20 mg total) by mouth 2 (two) times daily with a meal. 10 tablet Raylene Everts, MD   baclofen (LIORESAL) 10 MG tablet Muscle relaxer.  Take 1 or 2 at bedtime 30 each Raylene Everts, MD      I have reviewed the PDMP during this encounter.   Raylene Everts, MD 11/02/21 [place]

## 2021-11-02 NOTE — ED Triage Notes (Signed)
Fall x 1 week ago, Left buttock radiates to back of Left knee

## 2021-11-02 NOTE — Discharge Instructions (Addendum)
  Take the prednisone as directed.  This is an anti-inflammatory medicine. Take 1 or 2 muscle relaxers at bedtime.  This will help you rest and help to relax this injured muscle Use warm to the area for 20 minutes every couple of hours See Dr. Darene Lamer if not improving by the end of the week

## 2021-11-04 NOTE — Telephone Encounter (Signed)
Benefits Investigation Details received from [employer] Injection: Orthovisc  Medical: Deductible does not apply. Once the OOP has been met, Patient is covered at 100%. Only one copay applies per date of service.  PA required: Yes PA was still valid from previous inquiry.   Pharmacy: Product is not covered under the pharmacy  plan.   Specialty Pharmacy: [facility]  May fill through: [facility] [place] Copay/Coinsurance:  Product Copay: 20% ($280) Administration Coinsurance:  Administration Copay: $30 Deductible:  Out of Pocket Max: $4500 (met: $58.29)    Spoke with patient regarding OOP cost for injections. He reports this is too expensive and the injection he received was working fine.

## 2021-11-08 ENCOUNTER — Other Ambulatory Visit: Payer: Self-pay | Admitting: Family Medicine

## 2021-11-08 DIAGNOSIS — N401 Enlarged prostate with lower urinary tract symptoms: Secondary | ICD-10-CM

## 2021-12-22 ENCOUNTER — Encounter: Payer: Self-pay | Admitting: General Practice

## 2021-12-29 ENCOUNTER — Ambulatory Visit (INDEPENDENT_AMBULATORY_CARE_PROVIDER_SITE_OTHER): Payer: Medicare Other | Admitting: Family Medicine

## 2021-12-29 DIAGNOSIS — Z0000 Encounter for general adult medical examination without abnormal findings: Secondary | ICD-10-CM | POA: Diagnosis not present

## 2021-12-29 NOTE — Progress Notes (Signed)
MEDICARE ANNUAL WELLNESS VISIT  12/29/2021  Telephone Visit Disclaimer This Medicare AWV was conducted by telephone due to national recommendations for restrictions regarding the COVID-19 Pandemic (e.g. social distancing).  I verified, using two identifiers, that I am speaking with Tristan Mayer or their authorized healthcare agent. I discussed the limitations, risks, security, and privacy concerns of performing an evaluation and management service by telephone and the potential availability of an in-person appointment in the future. The patient expressed understanding and agreed to proceed.  Location of Patient: Home Location of Provider (nurse):  In the office.  Subjective:    Tristan Mayer is a 81 y.o. male patient of Mayer, Tristan Kocher, MD who had a Medicare Annual Wellness Visit today via telephone. Tristan Mayer is Retired and lives with their spouse. he has 3 children. he reports that he is socially active and does interact with friends/family regularly. he is minimally physically active and enjoys playing golf.  Patient Care Team: Tristan Marry, MD as PCP - General (Family Medicine) Tristan Mayer, [facility] as Pharmacist (Pharmacist)     12/29/2021    8:08 AM 07/27/2020    9:18 AM 01/08/2018    2:06 PM 07/03/2017   11:18 AM  Advanced Directives  Does Patient Have a Medical Advance Directive? Yes Yes Yes Yes  Type of Advance Directive Living will [place];Living will [place];Living will [place];Living will  Does patient want to make changes to medical advance directive? No - Patient declined No - Patient declined  No - Patient declined  Copy of [place] in Chart?  No - copy requested Yes No - copy requested    Hospital Utilization Over the Past 12 Months: # of hospitalizations or ER visits: 0 # of surgeries: 0  Review of Systems    Patient reports that his overall health is unchanged compared to  last year.  History obtained from chart review and the patient  Patient Reported Readings (BP, Pulse, CBG, Weight, etc) none  Pain Assessment Pain : 0-10 Pain Score: 8  Pain Type: Chronic pain Pain Location: Hip Pain Orientation: Left, Right Pain Descriptors / Indicators: Aching Pain Onset: More than a month ago Pain Frequency: Intermittent Pain Relieving Factors: Rest and heat  Pain Relieving Factors: Rest and heat  Current Medications & Allergies (verified) Allergies as of 12/29/2021       Reactions   Statins Other (See Comments)   Other reaction(s): Other All statin drugs Leg cramps when he was on atorvastatin and fenofibrate simultaneously. Discussed with his PCP Dr. Remus Blake on the phone [phone]) All statin drugs Leg cramps when he was on atorvastatin and fenofibrate simultaneously. Discussed with his PCP Dr. Remus Blake on the phone [phone]) All statin drugs   Diltiazem Other (See Comments)   Chest pain   Eliquis [apixaban] Other (See Comments)   Feels weak    Lisinopril Other (See Comments)   Weakness        Medication List        Accurate as of December 29, 2021  8:19 AM. If you have any questions, ask your nurse or doctor.          Accu-Chek Guide Control Liqd   acetaminophen 650 MG CR tablet Commonly known as: TYLENOL Take 1 tablet (650 mg total) by mouth every 8 (eight) hours as needed for pain.   baclofen 10 MG tablet Commonly known as: LIORESAL Muscle relaxer.  Take 1 or 2 at  bedtime   blood glucose meter kit and supplies Dispense based on patient and insurance preference. Use up to four times daily as directed. Dx: E11.8, E11.65   carvedilol 6.25 MG tablet Commonly known as: COREG Take 1 tablet (6.25 mg total) by mouth 2 (two) times daily with a meal.   Co-Enzyme Q-10 30 MG Caps Take 400 mg by mouth daily.   finasteride 5 MG tablet Commonly known as: PROSCAR TAKE 1 TABLET BY MOUTH AT BEDTIME   gabapentin 300 MG  capsule Commonly known as: NEURONTIN TAKE 2 CAPSULES BY MOUTH IN THE MORNING AND 3 IN THE EVENING   losartan 25 MG tablet Commonly known as: COZAAR Take 1 tablet by mouth once daily   metFORMIN 1000 MG tablet Commonly known as: GLUCOPHAGE TAKE 1 TABLET BY MOUTH TWICE DAILY WITH MEALS .   OneTouch Verio Reflect w/Device Kit 1 each by Does not apply route 2 (two) times daily. Dx: E11.8, E11.65 - Check fasting blood sugar every morning and once 2 hours after largest meal of the day.   OneTouch Verio test strip Generic drug: glucose blood Dx: E11.8, E11.65 - Check fasting blood sugar every morning and once 2 hours after largest meal of the day.   predniSONE 20 MG tablet Commonly known as: DELTASONE Take 1 tablet (20 mg total) by mouth 2 (two) times daily with a meal.   rosuvastatin 40 MG tablet Commonly known as: CRESTOR Take 1 tablet (40 mg total) by mouth at bedtime.   Semaglutide (2 MG/DOSE) 8 MG/3ML Sopn Inject 2 mg as directed once a week.   sertraline 100 MG tablet Commonly known as: ZOLOFT Take 1 tablet (100 mg total) by mouth daily.   Synvisc 16 MG/2ML Sosy intra-articular injection Generic drug: hylan Inject one syringe into each knee weekly x3   tamsulosin 0.4 MG Caps capsule Commonly known as: FLOMAX Take 1 capsule by mouth twice daily   Tresiba FlexTouch 100 UNIT/ML FlexTouch Pen Generic drug: insulin degludec INJECT 65 TO 80 UNITS SUBCUTANEOUSLY ONCE DAILY .  KEEP  APPT  FOR  REFILLS   Ultra Thin Lancets 31G Misc   Xarelto 20 MG Tabs tablet Generic drug: rivaroxaban TAKE 1 TABLET BY MOUTH ONCE DAILY WITH SUPPER        History (reviewed): Past Medical History:  Diagnosis Date   Atrial fibrillation (HCC)    CVA (cerebral vascular accident) ([place])    Diabetes mellitus ([place])    GERD (gastroesophageal reflux disease)    Hyperlipidemia    PUD (peptic ulcer disease)    Past Surgical History:  Procedure Laterality Date   CATARACT EXTRACTION W/  INTRAOCULAR LENS IMPLANT Right    Family History  Problem Relation Age of Onset   Prostate cancer Father 41   Stroke Father    Diabetes Brother    Diabetes Brother    Atrial fibrillation Mother    Social History   Socioeconomic History   Marital status: Married    Spouse name: Tristan Mayer   Number of children: 3   Years of education: 12th Grade   Highest education level: 12th grade  Occupational History   Occupation: Retired  Tobacco Use   Smoking status: Some Days    Types: Cigars   Smokeless tobacco: Never   Tobacco comments:    Cigar only when playing golf   Vaping Use   Vaping Use: Never used  Substance and Sexual Activity   Alcohol use: Yes    Comment: Occasional   Drug use: Never  Sexual activity: Not on file  Other Topics Concern   Not on file  Social History Narrative   Lives with his wife. He enjoys playing golf three times a week.   Social Determinants of Health   Financial Resource Strain: Low Risk  (12/29/2021)   Overall Financial Resource Strain (CARDIA)    Difficulty of Paying Living Expenses: Not hard at all  Food Insecurity: No Food Insecurity (12/29/2021)   Hunger Vital Sign    Worried About Running Out of Food in the Last Year: Never true    Ran Out of Food in the Last Year: Never true  Transportation Needs: No Transportation Needs (12/29/2021)   PRAPARE - Hydrologist (Medical): No    Lack of Transportation (Non-Medical): No  Physical Activity: Sufficiently Active (12/29/2021)   Exercise Vital Sign    Days of Exercise per Week: 3 days    Minutes of Exercise per Session: 120 min  Stress: No Stress Concern Present (12/29/2021)   [place]    Feeling of Stress : Not at all  Social Connections: [place] (12/29/2021)   Social Connection and Isolation Panel [NHANES]    Frequency of Communication with Friends and Family: Once a week    Frequency of  Social Gatherings with Friends and Family: Three times a week    Attends Religious Services: More than 4 times per year    Active Member of Clubs or Organizations: Yes    Attends Archivist Meetings: More than 4 times per year    Marital Status: Married    Activities of Daily Living    12/29/2021    8:11 AM  In your present state of health, do you have any difficulty performing the following activities:  Hearing? 1  Comment wears hearing aids.  Vision? 0  Difficulty concentrating or making decisions? 0  Walking or climbing stairs? 0  Dressing or bathing? 0  Doing errands, shopping? 0  Preparing Food and eating ? N  Using the Toilet? N  Managing your Medications? N  Managing your Finances? N  Housekeeping or managing your Housekeeping? N    Patient Education/ Literacy How often do you need to have someone help you when you read instructions, pamphlets, or other written materials from your doctor or pharmacy?: 1 - Never What is the last grade level you completed in school?: 12th grade  Exercise Current Exercise Habits: Structured exercise class, Type of exercise: Other - see comments (playing golf), Time (Minutes): > 60, Frequency (Times/Week): 3, Weekly Exercise (Minutes/Week): 0, Intensity: Moderate, Exercise limited by: None identified  Diet Patient reports consuming 3 meals a day and 1 snack(s) a day Patient reports that his primary diet is: Regular Patient reports that she does have regular access to food.   Depression Screen    12/29/2021    8:09 AM 10/05/2021    3:50 PM 06/02/2021   10:10 AM 07/27/2020    9:19 AM 06/22/2020    9:23 AM 03/18/2020   10:41 AM 02/25/2019    3:13 PM  PHQ 2/9 Scores  PHQ - 2 Score 0 0 0 0 0 0 0     Fall Risk    12/29/2021    8:09 AM 10/05/2021    3:50 PM 06/02/2021   10:10 AM 07/27/2020    9:19 AM 06/22/2020    9:23 AM  Fall Risk   Falls in the past year? 1 0 0 0  0  Number falls in past yr: 0 0 0 0   Injury with Fall? 1 0 0 0    Risk for fall due to : History of fall(s) No Fall Risks No Fall Risks No Fall Risks No Fall Risks  Follow up Falls evaluation completed;Education provided;Falls prevention discussed Falls prevention discussed;Falls evaluation completed Falls prevention discussed;Falls evaluation completed Falls evaluation completed      Objective:  Bomani Oommen seemed alert and oriented and he participated appropriately during our telephone visit.  Blood Pressure Weight BMI  BP Readings from Last 3 Encounters:  11/02/21 111/74  10/05/21 115/61  08/23/21 138/60   Wt Readings from Last 3 Encounters:  11/02/21 240 lb (108.9 kg)  10/05/21 237 lb (107.5 kg)  08/23/21 243 lb (110.2 kg)   BMI Readings from Last 1 Encounters:  11/02/21 34.44 kg/m    *Unable to obtain current vital signs, weight, and BMI due to telephone visit type  Hearing/Vision  [place] did not seem to have difficulty with hearing/understanding during the telephone conversation Reports that he has not had a formal eye exam by an eye care professional within the past year Reports that he has had a formal hearing evaluation within the past year *Unable to fully assess hearing and vision during telephone visit type  Cognitive Function:    12/29/2021    8:13 AM 07/27/2020    9:24 AM  6CIT Screen  What Year? 0 points 0 points  What month? 0 points 0 points  What time? 0 points 0 points  Count back from 20 0 points 0 points  Months in reverse 2 points 0 points  Repeat phrase 2 points 0 points  Total Score 4 points 0 points   (Normal:0-7, Significant for Dysfunction: >8)  Normal Cognitive Function Screening: Yes   Immunization & Health Maintenance Record Immunization History  Administered Date(s) Administered   Fluad Quad(high Dose 65+) 02/25/2019, 01/14/2021   Influenza, High Dose Seasonal PF 02/18/2015, 02/06/2017, 01/12/2018, 03/03/2020   Influenza, Seasonal, Injecte, Preservative Fre 02/08/2016   Influenza-Unspecified  02/08/2016, 03/02/2017   Janssen (J&J) SARS-COV-2 Vaccination 07/13/2019, 05/18/2020   PPD Test 03/02/2017   Pneumococcal Conjugate-13 01/19/2016   Pneumococcal Polysaccharide-23 09/30/2000, 02/28/2013   Tdap 05/02/2009    Health Maintenance  Topic Date Due   OPHTHALMOLOGY EXAM  12/29/2021 (Originally 11/03/2021)   Zoster Vaccines- Shingrix (1 of 2) 01/05/2022 (Originally 07/28/1992)   COVID-19 Vaccine (3 - Booster for Janssen series) 01/14/2022 (Originally 07/13/2020)   INFLUENZA VACCINE  07/31/2022 (Originally 11/30/2021)   TETANUS/TDAP  10/06/2022 (Originally 05/03/2019)   Diabetic kidney evaluation - Urine ACR  03/03/2022   HEMOGLOBIN A1C  04/06/2022   FOOT EXAM  06/02/2022   Diabetic kidney evaluation - GFR measurement  07/06/2022   Pneumonia Vaccine 31+ Years old  Completed   Hepatitis C Screening  Completed   HPV VACCINES  Aged Out       Assessment  This is a routine wellness examination for Tristan Mayer.  Health Maintenance: Due or Overdue There are no preventive care reminders to display for this patient.   Tristan Mayer does not need a referral for Community Assistance: Care Management:   no Social Work:    no Prescription Assistance:  no Nutrition/Diabetes Education:  no   Plan:  Personalized Goals  Goals Addressed               This Visit's Progress     Patient Stated (pt-stated)        12/29/2021 AWV  Goal: Diabetes Management  Patient will maintain an A1C level below 7.5 Patient will not develop any diabetic foot complications Patient will not experience any hypoglycemic episodes over the next 3 months Patient will notify our office of any CBG readings outside of the provider recommended range by calling [phone] Patient will adhere to provider recommendations for diabetes management  Patient Self Management Activities take all medications as prescribed and report any negative side effects monitor and record blood sugar readings as directed adhere  to a low carbohydrate diet that incorporates lean proteins, vegetables, whole grains, low glycemic fruits check feet daily noting any sores, cracks, injuries, or callous formations see PCP or podiatrist if he notices any changes in his legs, feet, or toenails Patient will visit PCP and have an A1C level checked every 3 to 6 months as directed  have a yearly eye exam to monitor for vascular changes associated with diabetes and will request that the report be sent to his pcp.  consult with his PCP regarding any changes in his health or new or worsening symptoms        Personalized Health Maintenance & Screening Recommendations  Influenza vaccine Eye exam Shingles vaccine- Patient declined Td vaccine  Lung Cancer Screening Recommended: no (Low Dose CT Chest recommended if Age 33-80 years, 30 pack-year currently smoking OR have quit w/in past 15 years) Hepatitis C Screening recommended: no HIV Screening recommended: no  Advanced Directives: Written information was not prepared per patient's request.  Referrals & Orders No orders of the defined types were placed in this encounter.   Follow-up Plan Follow-up with Tristan Marry, MD as planned Schedule your Td vaccine at your pharmacy. Please schedule your eye exam.  Medicare wellness visit in one year. Patient will access AVS on my chart.   I have personally reviewed and noted the following in the patient's chart:   Medical and social history Use of alcohol, tobacco or illicit drugs  Current medications and supplements Functional ability and status Nutritional status Physical activity Advanced directives List of other physicians Hospitalizations, surgeries, and ER visits in previous 12 months Vitals Screenings to include cognitive, depression, and falls Referrals and appointments  In addition, I have reviewed and discussed with Tristan Mayer certain preventive protocols, quality metrics, and best practice  recommendations. A written personalized care plan for preventive services as well as general preventive health recommendations is available and can be mailed to the patient at his request.      Tinnie Gens, RN BSN  12/29/2021

## 2021-12-29 NOTE — Patient Instructions (Addendum)
[place] Maintenance Summary and Written Plan of Care  Tristan Mayer ,  Thank you for allowing me to perform your Medicare Annual Wellness Visit and for your ongoing commitment to your health.   Health Maintenance & Immunization History Health Maintenance  Topic Date Due  . OPHTHALMOLOGY EXAM  12/29/2021 (Originally 11/03/2021)  . Zoster Vaccines- Shingrix (1 of 2) 01/05/2022 (Originally 07/28/1992)  . COVID-19 Vaccine (3 - Booster for Janssen series) 01/14/2022 (Originally 07/13/2020)  . INFLUENZA VACCINE  07/31/2022 (Originally 11/30/2021)  . TETANUS/TDAP  10/06/2022 (Originally 05/03/2019)  . Diabetic kidney evaluation - Urine ACR  03/03/2022  . HEMOGLOBIN A1C  04/06/2022  . FOOT EXAM  06/02/2022  . Diabetic kidney evaluation - GFR measurement  07/06/2022  . Pneumonia Vaccine 73+ Years old  Completed  . Hepatitis C Screening  Completed  . HPV VACCINES  Aged Out   Immunization History  Administered Date(s) Administered  . Fluad Quad(high Dose 65+) 02/25/2019, 01/14/2021  . Influenza, High Dose Seasonal PF 02/18/2015, 02/06/2017, 01/12/2018, 03/03/2020  . Influenza, Seasonal, Injecte, Preservative Fre 02/08/2016  . Influenza-Unspecified 02/08/2016, 03/02/2017  . Janssen (J&J) SARS-COV-2 Vaccination 07/13/2019, 05/18/2020  . PPD Test 03/02/2017  . Pneumococcal Conjugate-13 01/19/2016  . Pneumococcal Polysaccharide-23 09/30/2000, 02/28/2013  . Tdap 05/02/2009    These are the patient goals that we discussed:  Goals Addressed              This Visit's Progress   .  Patient Stated (pt-stated)        12/29/2021 AWV Goal: Diabetes Management  Patient will maintain an A1C level below 7.5 Patient will not develop any diabetic foot complications Patient will not experience any hypoglycemic episodes over the next 3 months Patient will notify our office of any CBG readings outside of the provider recommended range by calling [phone] Patient will adhere  to provider recommendations for diabetes management  Patient Self Management Activities take all medications as prescribed and report any negative side effects monitor and record blood sugar readings as directed adhere to a low carbohydrate diet that incorporates lean proteins, vegetables, whole grains, low glycemic fruits check feet daily noting any sores, cracks, injuries, or callous formations see PCP or podiatrist if he notices any changes in his legs, feet, or toenails Patient will visit PCP and have an A1C level checked every 3 to 6 months as directed  have a yearly eye exam to monitor for vascular changes associated with diabetes and will request that the report be sent to his pcp.  consult with his PCP regarding any changes in his health or new or worsening symptoms         This is a list of Health Maintenance Items that are overdue or due now: Influenza vaccine Eye exam Shingles vaccine- Patient declined Td vaccine  Orders/Referrals Placed Today: No orders of the defined types were placed in this encounter.  (Contact our referral department at [phone] if you have not spoken with someone about your referral appointment within the next 5 days)    Follow-up Plan Follow-up with Hali Marry, MD as planned Schedule your Td vaccine at your pharmacy. Please schedule your eye exam.  Medicare wellness visit in one year. Patient will access AVS on my chart.      Health Maintenance, Male Adopting a healthy lifestyle and getting preventive care are important in promoting health and wellness. Ask your health care provider about: The right schedule for you to have regular tests and exams. Things  you can do on your own to prevent diseases and keep yourself healthy. What should I know about diet, weight, and exercise? Eat a healthy diet  Eat a diet that includes plenty of vegetables, fruits, low-fat dairy products, and lean protein. Do not eat a lot of foods that  are high in solid fats, added sugars, or sodium. Maintain a healthy weight Body mass index (BMI) is a measurement that can be used to identify possible weight problems. It estimates body fat based on height and weight. Your health care provider can help determine your BMI and help you achieve or maintain a healthy weight. Get regular exercise Get regular exercise. This is one of the most important things you can do for your health. Most adults should: Exercise for at least 150 minutes each week. The exercise should increase your heart rate and make you sweat (moderate-intensity exercise). Do strengthening exercises at least twice a week. This is in addition to the moderate-intensity exercise. Spend less time sitting. Even light physical activity can be beneficial. Watch cholesterol and blood lipids Have your blood tested for lipids and cholesterol at 79 years of age, then have this test every 5 years. You may need to have your cholesterol levels checked more often if: Your lipid or cholesterol levels are high. You are older than 79 years of age. You are at high risk for heart disease. What should I know about cancer screening? Many types of cancers can be detected early and may often be prevented. Depending on your health history and family history, you may need to have cancer screening at various ages. This may include screening for: Colorectal cancer. Prostate cancer. Skin cancer. Lung cancer. What should I know about heart disease, diabetes, and high blood pressure? Blood pressure and heart disease High blood pressure causes heart disease and increases the risk of stroke. This is more likely to develop in people who have high blood pressure readings or are overweight. Talk with your health care provider about your target blood pressure readings. Have your blood pressure checked: Every 3-5 years if you are 37-72 years of age. Every year if you are 94 years old or older. If you are  between the ages of 18 and 54 and are a current or former smoker, ask your health care provider if you should have a one-time screening for abdominal aortic aneurysm (AAA). Diabetes Have regular diabetes screenings. This checks your fasting blood sugar level. Have the screening done: Once every three years after age 88 if you are at a normal weight and have a low risk for diabetes. More often and at a younger age if you are overweight or have a high risk for diabetes. What should I know about preventing infection? Hepatitis B If you have a higher risk for hepatitis B, you should be screened for this virus. Talk with your health care provider to find out if you are at risk for hepatitis B infection. Hepatitis C Blood testing is recommended for: Everyone born from [birth year] through 1963. Anyone with known risk factors for hepatitis C. Sexually transmitted infections (STIs) You should be screened each year for STIs, including gonorrhea and chlamydia, if: You are sexually active and are younger than 79 years of age. You are older than 79 years of age and your health care provider tells you that you are at risk for this type of infection. Your sexual activity has changed since you were last screened, and you are at increased risk for chlamydia or gonorrhea.  Ask your health care provider if you are at risk. Ask your health care provider about whether you are at high risk for HIV. Your health care provider may recommend a prescription medicine to help prevent HIV infection. If you choose to take medicine to prevent HIV, you should first get tested for HIV. You should then be tested every 3 months for as long as you are taking the medicine. Follow these instructions at home: Alcohol use Do not drink alcohol if your health care provider tells you not to drink. If you drink alcohol: Limit how much you have to 0-2 drinks a day. Know how much alcohol is in your drink. In the [place] one drink equals one 12 oz  bottle of beer (355 mL), one 5 oz glass of wine (148 mL), or one 1 oz glass of hard liquor (44 mL). Lifestyle Do not use any products that contain nicotine or tobacco. These products include cigarettes, chewing tobacco, and vaping devices, such as e-cigarettes. If you need help quitting, ask your health care provider. Do not use street drugs. Do not share needles. Ask your health care provider for help if you need support or information about quitting drugs. General instructions Schedule regular health, dental, and eye exams. Stay current with your vaccines. Tell your health care provider if: You often feel depressed. You have ever been abused or do not feel safe at home. Summary Adopting a healthy lifestyle and getting preventive care are important in promoting health and wellness. Follow your health care provider's instructions about healthy diet, exercising, and getting tested or screened for diseases. Follow your health care provider's instructions on monitoring your cholesterol and blood pressure. This information is not intended to replace advice given to you by your health care provider. Make sure you discuss any questions you have with your health care provider. Document Revised: 09/07/2020 Document Reviewed: 09/07/2020 Elsevier Patient Education  [employer].

## 2022-01-05 ENCOUNTER — Other Ambulatory Visit: Payer: Self-pay | Admitting: Family Medicine

## 2022-01-16 ENCOUNTER — Other Ambulatory Visit: Payer: Self-pay | Admitting: Family Medicine

## 2022-01-16 ENCOUNTER — Ambulatory Visit
Admission: EM | Admit: 2022-01-16 | Discharge: 2022-01-16 | Disposition: A | Discharge status: Home / Self Care | Payer: Medicare Other | Attending: Family Medicine | Admitting: Family Medicine

## 2022-01-16 ENCOUNTER — Ambulatory Visit (INDEPENDENT_AMBULATORY_CARE_PROVIDER_SITE_OTHER): Payer: Medicare Other

## 2022-01-16 DIAGNOSIS — R109 Unspecified abdominal pain: Secondary | ICD-10-CM

## 2022-01-16 DIAGNOSIS — Z79899 Other long term (current) drug therapy: Secondary | ICD-10-CM | POA: Insufficient documentation

## 2022-01-16 DIAGNOSIS — E669 Obesity, unspecified: Secondary | ICD-10-CM | POA: Insufficient documentation

## 2022-01-16 DIAGNOSIS — R319 Hematuria, unspecified: Secondary | ICD-10-CM | POA: Diagnosis not present

## 2022-01-16 DIAGNOSIS — R1084 Generalized abdominal pain: Secondary | ICD-10-CM | POA: Diagnosis not present

## 2022-01-16 DIAGNOSIS — Z6834 Body mass index (BMI) 34.0-34.9, adult: Secondary | ICD-10-CM | POA: Insufficient documentation

## 2022-01-16 DIAGNOSIS — K5900 Constipation, unspecified: Secondary | ICD-10-CM

## 2022-01-16 DIAGNOSIS — F17290 Nicotine dependence, other tobacco product, uncomplicated: Secondary | ICD-10-CM | POA: Diagnosis not present

## 2022-01-16 DIAGNOSIS — R1030 Lower abdominal pain, unspecified: Secondary | ICD-10-CM | POA: Diagnosis not present

## 2022-01-16 LAB — POCT URINALYSIS DIP (MANUAL ENTRY)
Bilirubin, UA: NEGATIVE
Glucose, UA: NEGATIVE mg/dL
Ketones, POC UA: NEGATIVE mg/dL
Leukocytes, UA: NEGATIVE
Nitrite, UA: NEGATIVE
Protein Ur, POC: >=300 mg/dL — AB
Spec Grav, UA: >=1.03 — AB (ref 1.010–1.025)
Urobilinogen, UA: 0.2 E.U./dL
pH, UA: 5.5 (ref 5.0–8.0)

## 2022-01-16 MED ORDER — POLYETHYLENE GLYCOL 3350 17 G PO PACK
17.0000 g | PACK | Freq: Every day | ORAL | 0 refills | Status: DC
Start: 2022-01-16 — End: 2022-01-17

## 2022-01-16 NOTE — ED Triage Notes (Signed)
Pt c/o constipation and abdominal pain onset for a week. Pt  reports last bowel movement was over a week ago.

## 2022-01-16 NOTE — Discharge Instructions (Signed)
Use miralax as a stool softener Take a dose 2 x a day until you get results, then once a day any day you do not have a BM Drink more water Have your doctor check a urine test next time you visit

## 2022-01-16 NOTE — ED Provider Notes (Signed)
[facility]    CSN: [id number] Arrival date & time: 01/16/22  1148      History   Chief Complaint Chief Complaint  Patient presents with   Constipation    HPI Tristan Mayer is a 81 y.o. male.   HPI  Patient states that he thinks he has not had a bowel movement for a week.  He has developed some crampy lower abdominal pain the last couple of days.  He is here to find out if he has constipation "or something else wrong".  Denies urinary symptoms.  No fever or chills.  No history of colon disorder.  He states his appetite is poor he has not been able to eat for a couple of days.  He states he is not good about drinking water.  He is on multiple medications.  Medicines have not changed.  Past Medical History:  Diagnosis Date   Atrial fibrillation ([place])    CVA (cerebral vascular accident) ([place])    Diabetes mellitus ([place])    GERD (gastroesophageal reflux disease)    Hyperlipidemia    PUD (peptic ulcer disease)     Patient Active Problem List   Diagnosis Date Noted   AV block, 1st degree 07/05/2021   Idiopathic peripheral neuropathy 09/16/2019   BMI 36.0-36.9,adult 03/26/2019   Obesity, Class I, BMI 30.0-34.9 (see actual BMI) 08/22/2018   Morbid obesity ([place]) 05/23/2018   Aortic atherosclerosis ([place]) 03/12/2018   Primary osteoarthritis of both knees 01/03/2018   BPH (benign prostatic hyperplasia) 07/10/2017   INO (internuclear ophthalmoplegia), left 07/03/2017   Hearing loss 07/03/2017   ED (erectile dysfunction) 07/03/2017   Paroxysmal A-fib ([place]) 12/12/2016   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 Authorizing Provider  polyethylene glycol (MIRALAX / GLYCOLAX) 17 g packet Take 17 g by mouth daily. 01/16/22  Yes Raylene Everts, MD  acetaminophen (TYLENOL) 650 MG CR tablet Take 1 tablet (650 mg total) by mouth every 8 (eight) hours as needed for pain. 06/23/20   Silverio Decamp, MD  baclofen (LIORESAL) 10 MG tablet Muscle relaxer.  Take 1 or 2 at bedtime 11/02/21   Raylene Everts, MD  Blood Glucose Calibration (ACCU-CHEK GUIDE CONTROL) LIQD  08/13/18   [provider]  blood glucose meter kit and supplies Dispense based on patient and insurance preference. Use up to four times daily as directed. Dx: E11.8, E11.65 06/13/19   Hali Marry, MD  Blood Glucose Monitoring Suppl [facility] VERIO REFLECT) w/Device KIT 1 each by Does not apply route 2 (two) times daily. Dx: E11.8, E11.65 - Check fasting blood sugar every morning and once 2 hours after largest meal of the day. 06/14/19   Hali Marry, MD  carvedilol (COREG) 6.25 MG tablet Take 1 tablet (6.25 mg total) by mouth 2 (two) times daily with a meal. 10/05/21   Hali Marry, MD  Co-Enzyme Q-10 30 MG CAPS Take  400 mg by mouth daily.    [provider]  finasteride (PROSCAR) 5 MG tablet TAKE 1 TABLET BY MOUTH AT BEDTIME 11/08/21   Hali Marry, MD  gabapentin (NEURONTIN) 300 MG capsule TAKE 2 CAPSULES BY MOUTH IN THE MORNING AND 3 IN THE EVENING 09/28/21   Hali Marry, MD  glucose blood (ONETOUCH VERIO) test strip Dx: E11.8, E11.65 - Check fasting blood sugar every morning and once 2 hours after largest meal of the day. 02/17/21   Hali Marry, MD  hylan (SYNVISC) 16 MG/2ML SOSY intra-articular injection Inject one  syringe into each knee weekly x3 10/22/21   Silverio Decamp, MD  Insulin Degludec FlexTouch 100 UNIT/ML SOPN INJECT 65 TO 80 UNITS SUBCUTANEOUSLY ONCE DAILY . APPOINTMENT REQUIRED FOR FUTURE REFILLS 01/06/22   Hali Marry, MD  losartan (COZAAR) 25 MG tablet Take 1 tablet by mouth once daily 10/07/21   Hali Marry, MD  metFORMIN (GLUCOPHAGE) 1000 MG tablet TAKE 1 TABLET BY MOUTH TWICE DAILY WITH MEALS . 10/18/21   Hali Marry, MD  predniSONE (DELTASONE) 20 MG tablet Take 1 tablet (20 mg total) by mouth 2 (two) times daily with a meal. 11/02/21   Raylene Everts, MD  rosuvastatin (CRESTOR) 40 MG tablet Take 1 tablet (40 mg total) by mouth at bedtime. 06/02/21   Hali Marry, MD  Semaglutide, 2 MG/DOSE, 8 MG/3ML SOPN Inject 2 mg as directed once a week. 10/05/21   Hali Marry, MD  sertraline (ZOLOFT) 100 MG tablet Take 1 tablet (100 mg total) by mouth daily. 10/05/21   Hali Marry, MD  tamsulosin [facility]) 0.4 MG CAPS capsule Take 1 capsule by mouth twice daily 10/18/21   Hali Marry, MD  Ultra Thin Lancets 31G MISC  08/13/18   [provider]  XARELTO 20 MG TABS tablet TAKE 1 TABLET BY MOUTH ONCE DAILY WITH SUPPER 02/04/21   Hali Marry, MD    Family History Family History  Problem Relation Age of Onset   Prostate cancer Father 52   Stroke Father    Diabetes Brother    Diabetes Brother    Atrial fibrillation Mother     Social History Social History   Tobacco Use   Smoking status: Some Days    Types: Cigars   Smokeless tobacco: Never   Tobacco comments:    Cigar only when playing golf   Vaping Use   Vaping Use: Never used  Substance Use Topics   Alcohol use: Yes    Comment: Occasional   Drug use: Never     Allergies   Statins, Diltiazem, Eliquis [apixaban], and Lisinopril   Review of Systems Review of Systems See HPI  Physical Exam Triage Vital Signs ED Triage Vitals  Enc Vitals Group      BP 01/16/22 1156 (!) 113/56     Pulse Rate 01/16/22 1156 68     Resp 01/16/22 1156 16     Temp 01/16/22 1156 98.4 F (36.9 C)     Temp Source 01/16/22 1156 Oral     SpO2 01/16/22 1156 99 %     Weight 01/16/22 1159 242 lb (109.8 kg)     Height --      Head Circumference --      Peak Flow --      Pain Score 01/16/22 1159 9     Pain Loc --      Pain Edu? --  Excl. in GC? --    No data found.  Updated Vital Signs BP (!) 113/56 (BP Location: Right Arm)   Pulse 68   Temp 98.4 F (36.9 C) (Oral)   Resp 16   Wt 109.8 kg   SpO2 99%   BMI 34.72 kg/m          Physical Exam Constitutional:      General: He is not in acute distress.    Appearance: He is well-developed. He is obese.  HENT:     Head: Normocephalic and atraumatic.  Eyes:     Conjunctiva/sclera: Conjunctivae normal.     Pupils: Pupils are equal, round, and reactive to light.  Cardiovascular:     Rate and Rhythm: Normal rate and regular rhythm.     Heart sounds: Normal heart sounds.  Pulmonary:     Effort: Pulmonary effort is normal. No respiratory distress.     Breath sounds: Normal breath sounds.  Chest:     Chest wall: No tenderness.  Abdominal:     General: There is no distension.     Palpations: Abdomen is soft.     Comments: Abdomen is protuberant.  Bowel sounds are decreased.  There is tenderness bilaterally in the lower quadrants that is moderate.  No guarding or rebound.  No masses palpable  Musculoskeletal:        General: Normal range of motion.     Cervical back: Normal range of motion.  Skin:    General: Skin is warm and dry.  Neurological:     Mental Status: He is alert.  Psychiatric:        Mood and Affect: Mood normal.        Behavior: Behavior normal.      UC Treatments / Results  Labs (all labs ordered are listed, but only abnormal results are displayed) Labs Reviewed  POCT URINALYSIS DIP (MANUAL ENTRY) - Abnormal; Notable for the following components:      Result Value    Clarity, UA turbid (*)    Spec Grav, UA >=1.030 (*)    Blood, UA large (*)    Protein Ur, POC >=300 (*)    All other components within normal limits  URINE CULTURE    EKG   Radiology DG Abdomen 1 View  Result Date: 01/16/2022 CLINICAL DATA:  Abdominal pain, constipation EXAM: ABDOMEN - 1 VIEW COMPARISON:  None Available. FINDINGS: Normal bowel gas pattern.  No bowel obstruction Mild-to-moderate stool in the colon mostly in the transverse colon. Small amount of stool in the rectosigmoid. Negative for urinary tract calculi. No acute skeletal abnormality. Stool in the colon. Moderate degenerative change left hip IMPRESSION: Normal bowel gas pattern.  Mild-to-moderate Electronically Signed   By: Franchot Gallo M.D.   On: 01/16/2022 12:51    Procedures Procedures (including critical care time)  Medications Ordered in UC Medications - No data to display  Initial Impression / Assessment and Plan / UC Course  I have reviewed the triage vital signs and the nursing notes.  Pertinent labs & imaging results that were available during my care of the patient were reviewed by me and considered in my medical decision making (see chart for details).     Discussed fiber.  Fluids.  Daily walking.  MiraLAX Discussed hematuria.  This may just be from his anticoagulant but he needs to follow-up with his primary care  Final Clinical Impressions(s) / UC Diagnoses   Final diagnoses:  Generalized abdominal pain  Constipation, unspecified constipation  type  Hematuria, unspecified type     Discharge Instructions      Use miralax as a stool softener Take a dose 2 x a day until you get results, then once a day any day you do not have a BM Drink more water Have your doctor check a urine test next time you visit     ED Prescriptions     Medication Sig Dispense Auth. Provider   polyethylene glycol (MIRALAX / GLYCOLAX) 17 g packet Take 17 g by mouth daily. 14 each Raylene Everts, MD       PDMP not reviewed this encounter.   Raylene Everts, MD 01/16/22 1538

## 2022-01-17 ENCOUNTER — Telehealth: Payer: Self-pay | Admitting: Emergency Medicine

## 2022-01-17 ENCOUNTER — Encounter: Payer: Self-pay | Admitting: Family Medicine

## 2022-01-17 ENCOUNTER — Ambulatory Visit (INDEPENDENT_AMBULATORY_CARE_PROVIDER_SITE_OTHER): Payer: Medicare Other | Admitting: Family Medicine

## 2022-01-17 VITALS — BP 133/51 | HR 79 | Wt 233.0 lb

## 2022-01-17 DIAGNOSIS — R1084 Generalized abdominal pain: Secondary | ICD-10-CM

## 2022-01-17 DIAGNOSIS — K5900 Constipation, unspecified: Secondary | ICD-10-CM | POA: Diagnosis not present

## 2022-01-17 DIAGNOSIS — I480 Paroxysmal atrial fibrillation: Secondary | ICD-10-CM | POA: Diagnosis not present

## 2022-01-17 DIAGNOSIS — E118 Type 2 diabetes mellitus with unspecified complications: Secondary | ICD-10-CM | POA: Diagnosis not present

## 2022-01-17 DIAGNOSIS — Z23 Encounter for immunization: Secondary | ICD-10-CM | POA: Diagnosis not present

## 2022-01-17 DIAGNOSIS — Z794 Long term (current) use of insulin: Secondary | ICD-10-CM | POA: Diagnosis not present

## 2022-01-17 DIAGNOSIS — I10 Essential (primary) hypertension: Secondary | ICD-10-CM | POA: Diagnosis not present

## 2022-01-17 LAB — URINE CULTURE: Culture: NO GROWTH

## 2022-01-17 LAB — POCT GLYCOSYLATED HEMOGLOBIN (HGB A1C): Hemoglobin A1C: 6.7 % — AB (ref 4.0–5.6)

## 2022-01-17 MED ORDER — NA SULFATE-K SULFATE-MG SULF 17.5-3.13-1.6 GM/177ML PO SOLN: ORAL | 0 refills | Status: DC

## 2022-01-17 MED ORDER — KETOROLAC TROMETHAMINE 30 MG/ML IJ SOLN
60.0000 mg | Freq: Once | INTRAMUSCULAR | Status: AC
  Administered 2022-01-17: 60 mg via INTRAMUSCULAR

## 2022-01-17 MED ORDER — CARVEDILOL 3.125 MG PO TABS: 3.1250 mg | ORAL_TABLET | Freq: Two times a day (BID) | ORAL | 1 refills | Status: DC

## 2022-01-17 NOTE — Assessment & Plan Note (Signed)
Well controlled. Continue current regimen. Follow up in  4 mo  Lab Results  Component Value Date   HGBA1C 6.7 (A) 01/17/2022

## 2022-01-17 NOTE — Progress Notes (Signed)
Established Patient Office Visit  Subjective   Patient ID: Tristan Mayer, male    DOB: 1940-08-18  Age: 81 y.o. MRN: [id number]  Chief Complaint  Patient presents with   Diabetes   Hypertension    HPI  Diabetes - no hypoglycemic events. No wounds or sores that are not healing well. No increased thirst or urination. Checking glucose at home. Taking medications as prescribed without any side effects.  He was also seen yesterday in the urgent care for abdominal pain he had not had a bowel movement in almost 1 weeks.  They recommended starting MiraLAX. He tried ti last night and he vomited back up.  Had neg xrays yesterday.  He also tried a fleets suppository and says he just passed a little bit of stool when he even did an enema and did not get any results.  He says that he had started eating a lot of bread for breakfast and so had a change in his diet.  He says his normal routine is usually just 1 bowel movement a week.  Hypertension- Pt denies chest pain, SOB, dizziness, or heart palpitations.  Taking meds as directed w/o problems.  Denies medication side effects.       ROS    Objective:     BP (!) 133/51   Pulse 79   Wt 233 lb (105.7 kg)   SpO2 98%   BMI 33.43 kg/m    Physical Exam Constitutional:      Appearance: He is well-developed.  HENT:     Head: Normocephalic and atraumatic.  Cardiovascular:     Rate and Rhythm: Normal rate and regular rhythm.     Heart sounds: Normal heart sounds.  Pulmonary:     Effort: Pulmonary effort is normal.     Breath sounds: Normal breath sounds.  Abdominal:     General: Bowel sounds are normal. There is distension.     Palpations: Abdomen is soft.     Tenderness: There is abdominal tenderness. There is no guarding.  Skin:    General: Skin is warm and dry.  Neurological:     Mental Status: He is alert and oriented to person, place, and time.  Psychiatric:        Behavior: Behavior normal.      Results for orders placed or  performed in visit on 01/17/22  POCT glycosylated hemoglobin (Hb A1C)  Result Value Ref Range   Hemoglobin A1C 6.7 (A) 4.0 - 5.6 %   HbA1c POC (<> result, manual entry)     HbA1c, POC (prediabetic range)     HbA1c, POC (controlled diabetic range)        The ASCVD Risk score (Arnett DK, et al., 2019) failed to calculate for the following reasons:   The patient has a prior MI or stroke diagnosis    Assessment & Plan:   Problem List Items Addressed This Visit       Cardiovascular and Mediastinum   Paroxysmal A-fib ([place])    Blood pressure is at goal and he reports that most of the time his pulse at home is in the 50s it is up a little bit today because I think he is quite uncomfortable recommend go ahead and try to decrease his carvedilol down to 3.125.  He does feel like the medication makes him feel more tired.      Relevant Medications   carvedilol (COREG) 3.125 MG tablet   Benign essential hypertension    Well controlled. Continue current  regimen. Follow up in  4 mo       Relevant Medications   carvedilol (COREG) 3.125 MG tablet     Endocrine   Controlled diabetes mellitus type 2 with complications ([place])    Well controlled. Continue current regimen. Follow up in  4 mo  Lab Results  Component Value Date   HGBA1C 6.7 (A) 01/17/2022         Relevant Orders   POCT glycosylated hemoglobin (Hb A1C) (Completed)   Other Visit Diagnoses     Generalized abdominal pain    -  Primary   Relevant Medications   ketorolac (TORADOL) 30 MG/ML injection 60 mg (Completed)   Constipation, unspecified constipation type       Relevant Medications   Na Sulfate-K Sulfate-Mg Sulf (SUPREP BOWEL PREP KIT) 17.5-3.13-1.6 GM/177ML SOLN   Need for influenza vaccination       Relevant Orders   Flu Vaccine QUAD High Dose(Fluad) (Completed)       Constipation-he would like to try the bowel prep used for colonoscopy I think that is reasonable but he may end up having a hard time getting it  down similar to the MiraLAX.  But we can at least try something different to see if it is helpful encouraged him to do another fleets when he got home today and another enema if needed he can even repeat the fleets later this afternoon again if need be.  We did give him an injection of Toradol just for acute pain relief.  This should not be too much to mask any increase in pain.  He did have x-rays that did not indicate any type of bowel obstruction.  Encouraged him to really increase his water intake.  Return in about 4 months (around 05/19/2022) for Hypertension, Diabetes follow-up.    Beatrice Lecher, MD

## 2022-01-17 NOTE — Telephone Encounter (Signed)
Spoke with patient, states that he is doing "ok".  Patient threw up all of the Miralax that he had taken.  Patient did an e-visit with his PCP who prescribed him something else.  He's going to take what his PCP prescribed for him and will follow up as needed.

## 2022-01-17 NOTE — Assessment & Plan Note (Signed)
Blood pressure is at goal and he reports that most of the time his pulse at home is in the 50s it is up a little bit today because I think he is quite uncomfortable recommend go ahead and try to decrease his carvedilol down to 3.125.  He does feel like the medication makes him feel more tired.

## 2022-01-17 NOTE — Assessment & Plan Note (Signed)
Well controlled. Continue current regimen. Follow up in  4 mo 

## 2022-01-17 NOTE — Patient Instructions (Signed)
Do another fleets when you get home today and if you need to do another enema as well you can do that. Also try to drink a lot of water today that help stimulate the bowel Also try to do some walking if you can because that also stimulates the gut.  Avoid just sitting.

## 2022-01-19 ENCOUNTER — Encounter: Payer: Self-pay | Admitting: Family Medicine

## 2022-01-19 MED ORDER — SEMAGLUTIDE (1 MG/DOSE) 4 MG/3ML ~~LOC~~ SOPN: 1.0000 mg | PEN_INJECTOR | SUBCUTANEOUS | 3 refills | Status: DC

## 2022-01-24 ENCOUNTER — Other Ambulatory Visit: Payer: Self-pay | Admitting: Family Medicine

## 2022-02-01 ENCOUNTER — Other Ambulatory Visit: Payer: Self-pay | Admitting: Family Medicine

## 2022-02-01 DIAGNOSIS — N401 Enlarged prostate with lower urinary tract symptoms: Secondary | ICD-10-CM

## 2022-02-02 ENCOUNTER — Encounter: Payer: Self-pay | Admitting: Sports Medicine

## 2022-02-02 ENCOUNTER — Ambulatory Visit (INDEPENDENT_AMBULATORY_CARE_PROVIDER_SITE_OTHER): Payer: Medicare Other

## 2022-02-02 ENCOUNTER — Ambulatory Visit (INDEPENDENT_AMBULATORY_CARE_PROVIDER_SITE_OTHER): Payer: Medicare Other | Admitting: Sports Medicine

## 2022-02-02 DIAGNOSIS — M170 Bilateral primary osteoarthritis of knee: Secondary | ICD-10-CM

## 2022-02-02 NOTE — Assessment & Plan Note (Signed)
Pleasant 81 year old male returns, known bilateral knee osteoarthritis, he did not get much relief from triamcinolone so we did a Depo-Medrol injection back in June, he did really well, repeated today with Depo-Medrol 80 in both sides, we can start Orthovisc approval if he does not do well.

## 2022-02-02 NOTE — Progress Notes (Signed)
    Procedures performed today:    Procedure: Real-time Ultrasound Guided injection of the left knee Device: Samsung HS60  Verbal informed consent obtained.  Time-out conducted.  Noted no overlying erythema, induration, or other signs of local infection.  Skin prepped in a sterile fashion.  Local anesthesia: Topical Ethyl chloride.  With sterile technique and under real time ultrasound guidance: Noted mild effusion, 1 cc Depo-Medrol 80, 2 cc lidocaine, 2 cc bupivacaine injected easily Completed without difficulty  Advised to call if fevers/chills, erythema, induration, drainage, or persistent bleeding.  Images permanently stored and available for review in PACS.  Impression: Technically successful ultrasound guided injection.     Procedure: Real-time Ultrasound Guided injection of the right knee Device: Samsung HS60  Verbal informed consent obtained.  Time-out conducted.  Noted no overlying erythema, induration, or other signs of local infection.  Skin prepped in a sterile fashion.  Local anesthesia: Topical Ethyl chloride.  With sterile technique and under real time ultrasound guidance: Noted mild effusion, 1 cc Depo-Medrol 80, 2 cc lidocaine, 2 cc bupivacaine injected easily Completed without difficulty  Advised to call if fevers/chills, erythema, induration, drainage, or persistent bleeding.  Images permanently stored and available for review in PACS.  Impression: Technically successful ultrasound guided injection.  Independent interpretation of notes and tests performed by another provider:   None.  Brief History, Exam, Impression, and Recommendations:    Primary osteoarthritis of both knees Pleasant 81 year old male returns, known bilateral knee osteoarthritis, he did not get much relief from triamcinolone so we did a Depo-Medrol injection back in June, he did really well, repeated today with Depo-Medrol 80 in both sides, we can start Orthovisc approval if he does not do  well.    ____________________________________________ Gwen Her. Dianah Field, M.D., ABFM., CAQSM., AME. Primary Care and Sports Medicine [place] [facility]  Adjunct Professor of [place] of [facility] of Medicine  Risk manager

## 2022-02-05 ENCOUNTER — Other Ambulatory Visit: Payer: Self-pay | Admitting: Family Medicine

## 2022-03-17 ENCOUNTER — Other Ambulatory Visit: Payer: Self-pay | Admitting: Family Medicine

## 2022-03-22 DIAGNOSIS — Z129 Encounter for screening for malignant neoplasm, site unspecified: Secondary | ICD-10-CM | POA: Diagnosis not present

## 2022-03-22 DIAGNOSIS — Z85828 Personal history of other malignant neoplasm of skin: Secondary | ICD-10-CM | POA: Diagnosis not present

## 2022-03-22 DIAGNOSIS — L821 Other seborrheic keratosis: Secondary | ICD-10-CM | POA: Diagnosis not present

## 2022-03-23 ENCOUNTER — Telehealth: Payer: Self-pay | Admitting: Sports Medicine

## 2022-03-23 ENCOUNTER — Ambulatory Visit (INDEPENDENT_AMBULATORY_CARE_PROVIDER_SITE_OTHER): Payer: Medicare Other | Admitting: Sports Medicine

## 2022-03-23 ENCOUNTER — Encounter: Payer: Self-pay | Admitting: Sports Medicine

## 2022-03-23 VITALS — Ht 70.0 in | Wt 230.0 lb

## 2022-03-23 DIAGNOSIS — M170 Bilateral primary osteoarthritis of knee: Secondary | ICD-10-CM | POA: Diagnosis not present

## 2022-03-23 DIAGNOSIS — I700 Atherosclerosis of aorta: Secondary | ICD-10-CM | POA: Diagnosis not present

## 2022-03-23 MED ORDER — TRAMADOL HCL 50 MG PO TABS: 50.0000 mg | ORAL_TABLET | Freq: Three times a day (TID) | ORAL | 0 refills | Status: DC | PRN

## 2022-03-23 MED ORDER — ROSUVASTATIN CALCIUM 40 MG PO TABS: 40.0000 mg | ORAL_TABLET | Freq: Every day | ORAL | 3 refills | Status: DC

## 2022-03-23 NOTE — Assessment & Plan Note (Signed)
Pleasant 81 year old male, x-ray confirmed bilateral knee osteoarthritis, did not respond well to steroid injections at the last visit, we will start Orthovisc approval and add tramadol for pain relief in the meantime.

## 2022-03-23 NOTE — Progress Notes (Signed)
    Procedures performed today:    None.  Independent interpretation of notes and tests performed by another provider:   None.  Brief History, Exam, Impression, and Recommendations:    Primary osteoarthritis of both knees Pleasant 81 year old male, x-ray confirmed bilateral knee osteoarthritis, did not respond well to steroid injections at the last visit, we will start Orthovisc approval and add tramadol for pain relief in the meantime.  Chronic process with exacerbation and pharmacologic intervention  ____________________________________________ Gwen Her. Dianah Field, M.D., ABFM., CAQSM., AME. Primary Care and Sports Medicine [place] [facility]  Adjunct Professor of [place] of [facility] of Medicine  Risk manager

## 2022-03-23 NOTE — Telephone Encounter (Signed)
Visco approval please, bilateral, x-ray confirmed osteoarthritis, failed oral analgesics and steroid injections.  Failed greater than 6 weeks of physician directed physical therapy.

## 2022-03-28 NOTE — Telephone Encounter (Signed)
[employer] paperwork faxed to [employer] at [phone] Request is for Orthovisc Pt's insurance prefers Orthovisc Fax confirmation receipt received  

## 2022-03-29 NOTE — Telephone Encounter (Signed)
Benefits Investigation Details received from [facility] Injection: Synvisc  Medical: Deductible does not apply. Once the OOP has been met, pt is covered at 100%. Only one copay applies per DOS.  PA required: No  Per [facility] Provider portal, no PA is required for Synvisc #[id number].  Pharmacy: Product not covered under pharmacy plan.   Specialty Pharmacy: [facility]  May fill through: Tristan Mayer and Tristan Mayer Copay/Coinsurance:  Product Copay: 20% ($280) Administration Coinsurance:  Administration Copay: $30 Deductible:  Out of Pocket Max: $4500 (met: $177.08)    Pt's estimated cost is $310 per week for bilateral injections.   Left msg for pt to return call regarding gel injections.

## 2022-04-05 ENCOUNTER — Ambulatory Visit (INDEPENDENT_AMBULATORY_CARE_PROVIDER_SITE_OTHER): Payer: Medicare Other

## 2022-04-05 ENCOUNTER — Ambulatory Visit (INDEPENDENT_AMBULATORY_CARE_PROVIDER_SITE_OTHER): Payer: Medicare Other | Admitting: Sports Medicine

## 2022-04-05 DIAGNOSIS — M170 Bilateral primary osteoarthritis of knee: Secondary | ICD-10-CM

## 2022-04-05 MED ORDER — HYALURONAN 30 MG/2ML IX SOSY
30.0000 mg | PREFILLED_SYRINGE | Freq: Once | INTRA_ARTICULAR | Status: AC
  Administered 2022-04-05: 30 mg via INTRA_ARTICULAR

## 2022-04-05 NOTE — Assessment & Plan Note (Signed)
Insufficient response to steroid injections, starting Orthovisc bilaterally today, return in a week for Orthovisc No. 2 of 4 both knees.

## 2022-04-05 NOTE — Progress Notes (Signed)
    Procedures performed today:    Procedure: Real-time Ultrasound Guided injection of the left knee Device: Samsung HS60  Verbal informed consent obtained.  Time-out conducted.  Noted no overlying erythema, induration, or other signs of local infection.  Skin prepped in a sterile fashion.  Local anesthesia: Topical Ethyl chloride.  With sterile technique and under real time ultrasound guidance: Mild effusion noted, 30 mg/2 mL of OrthoVisc (sodium hyaluronate) in a prefilled syringe was injected easily into the knee through a 22-gauge needle. Completed without difficulty  Advised to call if fevers/chills, erythema, induration, drainage, or persistent bleeding.  Images permanently stored and available for review in PACS.  Impression: Technically successful ultrasound guided injection.  Procedure: Real-time Ultrasound Guided injection of the right knee Device: Samsung HS60  Verbal informed consent obtained.  Time-out conducted.  Noted no overlying erythema, induration, or other signs of local infection.  Skin prepped in a sterile fashion.  Local anesthesia: Topical Ethyl chloride.  With sterile technique and under real time ultrasound guidance: Mild effusion noted, 30 mg/2 mL of OrthoVisc (sodium hyaluronate) in a prefilled syringe was injected easily into the knee through a 22-gauge needle. Completed without difficulty  Advised to call if fevers/chills, erythema, induration, drainage, or persistent bleeding.  Images permanently stored and available for review in PACS.  Impression: Technically successful ultrasound guided injection.  Independent interpretation of notes and tests performed by another provider:   None.  Brief History, Exam, Impression, and Recommendations:    Primary osteoarthritis of both knees Insufficient response to steroid injections, starting Orthovisc bilaterally today, return in a week for Orthovisc No. 2 of 4 both  knees.    ____________________________________________ Gwen Her. Dianah Field, M.D., ABFM., CAQSM., AME. Primary Care and Sports Medicine [place] [facility]  Adjunct Professor of [place] of [facility] of Medicine  Risk manager

## 2022-04-08 ENCOUNTER — Other Ambulatory Visit: Payer: Self-pay | Admitting: Sports Medicine

## 2022-04-08 DIAGNOSIS — M170 Bilateral primary osteoarthritis of knee: Secondary | ICD-10-CM

## 2022-04-13 ENCOUNTER — Ambulatory Visit (INDEPENDENT_AMBULATORY_CARE_PROVIDER_SITE_OTHER): Payer: Medicare Other | Admitting: Sports Medicine

## 2022-04-13 ENCOUNTER — Ambulatory Visit (INDEPENDENT_AMBULATORY_CARE_PROVIDER_SITE_OTHER): Payer: Medicare Other

## 2022-04-13 DIAGNOSIS — M1612 Unilateral primary osteoarthritis, left hip: Secondary | ICD-10-CM

## 2022-04-13 DIAGNOSIS — M170 Bilateral primary osteoarthritis of knee: Secondary | ICD-10-CM

## 2022-04-13 MED ORDER — HYALURONAN 30 MG/2ML IX SOSY
30.0000 mg | PREFILLED_SYRINGE | Freq: Once | INTRA_ARTICULAR | Status: AC
  Administered 2022-04-13: 30 mg via INTRA_ARTICULAR

## 2022-04-13 NOTE — Progress Notes (Signed)
    Procedures performed today:    Procedure: Real-time Ultrasound Guided injection of the left knee Device: Samsung HS60  Verbal informed consent obtained.  Time-out conducted.  Noted no overlying erythema, induration, or other signs of local infection.  Skin prepped in a sterile fashion.  Local anesthesia: Topical Ethyl chloride.  With sterile technique and under real time ultrasound guidance: Mild effusion noted, 30 mg/2 mL of OrthoVisc (sodium hyaluronate) in a prefilled syringe was injected easily into the knee through a 22-gauge needle. Completed without difficulty  Advised to call if fevers/chills, erythema, induration, drainage, or persistent bleeding.  Images permanently stored and available for review in PACS.  Impression: Technically successful ultrasound guided injection.   Procedure: Real-time Ultrasound Guided injection of the right knee Device: Samsung HS60  Verbal informed consent obtained.  Time-out conducted.  Noted no overlying erythema, induration, or other signs of local infection.  Skin prepped in a sterile fashion.  Local anesthesia: Topical Ethyl chloride.  With sterile technique and under real time ultrasound guidance: Mild effusion noted, 30 mg/2 mL of OrthoVisc (sodium hyaluronate) in a prefilled syringe was injected easily into the knee through a 22-gauge needle. Completed without difficulty  Advised to call if fevers/chills, erythema, induration, drainage, or persistent bleeding.  Images permanently stored and available for review in PACS.  Impression: Technically successful ultrasound guided injection.  Independent interpretation of notes and tests performed by another provider:   None.  Brief History, Exam, Impression, and Recommendations:    Primary osteoarthritis of both knees Orthovisc 2 of 4 both knees, return in 1 week for #3 of 4  Primary osteoarthritis of left hip Known left hip osteoarthritis, increasing pain, loss of internal rotation  with reproduction of pain in the groin. Does not desire injection today, does not desire any changes pain medication/tramadol dosage. He understands that if he does desire treatment I am happy to do an injection at the next visit.    ____________________________________________ Gwen Her. Dianah Field, M.D., ABFM., CAQSM., AME. Primary Care and Sports Medicine [place] [facility]  Adjunct Professor of [place] of [facility] of Medicine  Risk manager

## 2022-04-13 NOTE — Assessment & Plan Note (Signed)
Known left hip osteoarthritis, increasing pain, loss of internal rotation with reproduction of pain in the groin. Does not desire injection today, does not desire any changes pain medication/tramadol dosage. He understands that if he does desire treatment I am happy to do an injection at the next visit.

## 2022-04-13 NOTE — Assessment & Plan Note (Signed)
Orthovisc 2 of 4 both knees, return in 1 week for #3 of 4

## 2022-04-15 ENCOUNTER — Other Ambulatory Visit: Payer: Self-pay | Admitting: Family Medicine

## 2022-04-19 NOTE — Progress Notes (Signed)
[facility] Quality Team Note  Name: Tristan Mayer Date of Birth: Dec 15, 1940 MRN: [id number] Date: 04/19/2022  [facility] Quality Team has reviewed this patient's chart, please see recommendations below:  [facility] Quality Other; (KED GAP- KIDNEY HEALTH EVALUATION- PATIENT NEEDS URINE MICROALBUMIN/CREATININE RATIO TEST COMPLETED BEFORE END OF YEAR FOR GAP CLOSURE. PATIENT HAS APPT TOMORROW 04/20/2022. CAN THIS TEST BE COMPLETED DURING OFFICE VISIT?)

## 2022-04-20 ENCOUNTER — Ambulatory Visit (INDEPENDENT_AMBULATORY_CARE_PROVIDER_SITE_OTHER): Payer: Medicare Other | Admitting: Sports Medicine

## 2022-04-20 ENCOUNTER — Ambulatory Visit: Payer: Medicare Other | Admitting: Family Medicine

## 2022-04-20 ENCOUNTER — Ambulatory Visit (INDEPENDENT_AMBULATORY_CARE_PROVIDER_SITE_OTHER): Payer: Medicare Other

## 2022-04-20 ENCOUNTER — Telehealth: Payer: Self-pay | Admitting: Family Medicine

## 2022-04-20 DIAGNOSIS — M170 Bilateral primary osteoarthritis of knee: Secondary | ICD-10-CM | POA: Diagnosis not present

## 2022-04-20 DIAGNOSIS — E118 Type 2 diabetes mellitus with unspecified complications: Secondary | ICD-10-CM

## 2022-04-20 MED ORDER — HYALURONAN 30 MG/2ML IX SOSY
30.0000 mg | PREFILLED_SYRINGE | Freq: Once | INTRA_ARTICULAR | Status: AC
  Administered 2022-04-20: 30 mg via INTRA_ARTICULAR

## 2022-04-20 NOTE — Telephone Encounter (Signed)
Please call pt: I know he was just here today to see Dr. Darene Lamer. But he needs labs done. Would he be able to come back tomorrow or next week for labs an urine test. Also please let us know when had last eye exam  No orders of the defined types were placed in this encounter.  Orders Placed This Encounter  Procedures   Urine Microalbumin w/creat. ratio   BASIC METABOLIC PANEL WITH GFR   Hemoglobin A1c

## 2022-04-20 NOTE — Progress Notes (Signed)
    Procedures performed today:    Procedure: Real-time Ultrasound Guided injection of the left knee Device: Samsung HS60  Verbal informed consent obtained.  Time-out conducted.  Noted no overlying erythema, induration, or other signs of local infection.  Skin prepped in a sterile fashion.  Local anesthesia: Topical Ethyl chloride.  With sterile technique and under real time ultrasound guidance: Mild effusion noted, 30 mg/2 mL of OrthoVisc (sodium hyaluronate) in a prefilled syringe was injected easily into the knee through a 22-gauge needle. Completed without difficulty  Advised to call if fevers/chills, erythema, induration, drainage, or persistent bleeding.  Images permanently stored and available for review in PACS.  Impression: Technically successful ultrasound guided injection.   Procedure: Real-time Ultrasound Guided injection of the right knee Device: Samsung HS60  Verbal informed consent obtained.  Time-out conducted.  Noted no overlying erythema, induration, or other signs of local infection.  Skin prepped in a sterile fashion.  Local anesthesia: Topical Ethyl chloride.  With sterile technique and under real time ultrasound guidance: Mild effusion noted, 30 mg/2 mL of OrthoVisc (sodium hyaluronate) in a prefilled syringe was injected easily into the knee through a 22-gauge needle. Completed without difficulty  Advised to call if fevers/chills, erythema, induration, drainage, or persistent bleeding.  Images permanently stored and available for review in PACS.  Impression: Technically successful ultrasound guided injection.  Independent interpretation of notes and tests performed by another provider:   None.  Brief History, Exam, Impression, and Recommendations:    Primary osteoarthritis of both knees Orthovisc 3 of 4 both knees, return in 1 week for #4 of 4    ____________________________________________ Gwen Her. Dianah Field, M.D., ABFM., CAQSM., AME. Primary  Care and Sports Medicine [place] [facility]  Adjunct Professor of [place] of [facility] of Medicine  Risk manager

## 2022-04-20 NOTE — Progress Notes (Signed)
Pt is scheduled to see you on 04/27/22. Please do a microalbumin to close his care gap. Appt notes changed to reflect this.

## 2022-04-20 NOTE — Assessment & Plan Note (Signed)
Orthovisc 3 of 4 both knees, return in 1 week for #4 of 4

## 2022-04-20 NOTE — Telephone Encounter (Signed)
That would be fine.  We just need to close his quality gaps before January 1.

## 2022-04-22 DIAGNOSIS — E113211 Type 2 diabetes mellitus with mild nonproliferative diabetic retinopathy with macular edema, right eye: Secondary | ICD-10-CM | POA: Diagnosis not present

## 2022-04-22 DIAGNOSIS — Z961 Presence of intraocular lens: Secondary | ICD-10-CM | POA: Diagnosis not present

## 2022-04-22 DIAGNOSIS — H524 Presbyopia: Secondary | ICD-10-CM | POA: Diagnosis not present

## 2022-04-22 DIAGNOSIS — E113392 Type 2 diabetes mellitus with moderate nonproliferative diabetic retinopathy without macular edema, left eye: Secondary | ICD-10-CM | POA: Diagnosis not present

## 2022-04-22 DIAGNOSIS — H25812 Combined forms of age-related cataract, left eye: Secondary | ICD-10-CM | POA: Diagnosis not present

## 2022-04-22 DIAGNOSIS — H11001 Unspecified pterygium of right eye: Secondary | ICD-10-CM | POA: Diagnosis not present

## 2022-04-22 DIAGNOSIS — H527 Unspecified disorder of refraction: Secondary | ICD-10-CM | POA: Diagnosis not present

## 2022-04-22 LAB — HM DIABETES EYE EXAM

## 2022-04-27 ENCOUNTER — Ambulatory Visit (INDEPENDENT_AMBULATORY_CARE_PROVIDER_SITE_OTHER): Payer: Medicare Other | Admitting: Sports Medicine

## 2022-04-27 ENCOUNTER — Ambulatory Visit (INDEPENDENT_AMBULATORY_CARE_PROVIDER_SITE_OTHER): Payer: Medicare Other

## 2022-04-27 DIAGNOSIS — M170 Bilateral primary osteoarthritis of knee: Secondary | ICD-10-CM

## 2022-04-27 MED ORDER — HYALURONAN 30 MG/2ML IX SOSY
30.0000 mg | PREFILLED_SYRINGE | Freq: Once | INTRA_ARTICULAR | Status: AC
  Administered 2022-04-27: 30 mg via INTRA_ARTICULAR

## 2022-04-27 NOTE — Assessment & Plan Note (Signed)
Orthovisc 4 of 4 both knees, return in 4 to 6 weeks as needed.

## 2022-04-27 NOTE — Progress Notes (Signed)
    Procedures performed today:    Procedure: Real-time Ultrasound Guided injection of the left knee Device: Samsung HS60  Verbal informed consent obtained.  Time-out conducted.  Noted no overlying erythema, induration, or other signs of local infection.  Skin prepped in a sterile fashion.  Local anesthesia: Topical Ethyl chloride.  With sterile technique and under real time ultrasound guidance: Mild effusion noted, 30 mg/2 mL of OrthoVisc (sodium hyaluronate) in a prefilled syringe was injected easily into the knee through a 22-gauge needle. Completed without difficulty  Advised to call if fevers/chills, erythema, induration, drainage, or persistent bleeding.  Images permanently stored and available for review in PACS.  Impression: Technically successful ultrasound guided injection.   Procedure: Real-time Ultrasound Guided injection of the right knee Device: Samsung HS60  Verbal informed consent obtained.  Time-out conducted.  Noted no overlying erythema, induration, or other signs of local infection.  Skin prepped in a sterile fashion.  Local anesthesia: Topical Ethyl chloride.  With sterile technique and under real time ultrasound guidance: Mild effusion noted, 30 mg/2 mL of OrthoVisc (sodium hyaluronate) in a prefilled syringe was injected easily into the knee through a 22-gauge needle. Completed without difficulty  Advised to call if fevers/chills, erythema, induration, drainage, or persistent bleeding.  Images permanently stored and available for review in PACS.  Impression: Technically successful ultrasound guided injection.  Independent interpretation of notes and tests performed by another provider:   None.  Brief History, Exam, Impression, and Recommendations:    Primary osteoarthritis of both knees Orthovisc 4 of 4 both knees, return in 4 to 6 weeks as needed.    ____________________________________________ Gwen Her. Dianah Field, M.D., ABFM., CAQSM.,  AME. Primary Care and Sports Medicine [place] [facility]  Adjunct Professor of [place] of [facility] of Medicine  Risk manager

## 2022-04-27 NOTE — Telephone Encounter (Addendum)
Please call patient: It looks like he did see Dr. Darene Lamer today but it does not look like he had the blood work done.  If he could get that done this week that would be wonderful.

## 2022-04-27 NOTE — Telephone Encounter (Signed)
LVM asking pt to either come back in today or by the end of the week to have his labs done.

## 2022-04-28 ENCOUNTER — Other Ambulatory Visit: Payer: Self-pay | Admitting: Family Medicine

## 2022-04-28 NOTE — Telephone Encounter (Signed)
Spoke w/pt and advised him to come in to have labs done today or tomorrow. Pt stated that he would.

## 2022-04-29 ENCOUNTER — Other Ambulatory Visit: Payer: Self-pay | Admitting: Family Medicine

## 2022-04-29 DIAGNOSIS — Z794 Long term (current) use of insulin: Secondary | ICD-10-CM | POA: Diagnosis not present

## 2022-04-29 DIAGNOSIS — E118 Type 2 diabetes mellitus with unspecified complications: Secondary | ICD-10-CM | POA: Diagnosis not present

## 2022-04-29 DIAGNOSIS — N401 Enlarged prostate with lower urinary tract symptoms: Secondary | ICD-10-CM

## 2022-04-30 LAB — HEMOGLOBIN A1C
Hgb A1c MFr Bld: 7 % of total Hgb — ABNORMAL HIGH (ref <5.7)
Mean Plasma Glucose: 154 mg/dL
eAG (mmol/L): 8.5 mmol/L

## 2022-04-30 LAB — BASIC METABOLIC PANEL WITH GFR
BUN/Creatinine Ratio: 22 (calc) (ref 6–22)
BUN: 29 mg/dL — ABNORMAL HIGH (ref 7–25)
CO2: 24 mmol/L (ref 20–32)
Calcium: 9.9 mg/dL (ref 8.6–10.3)
Chloride: 105 mmol/L (ref 98–110)
Creat: 1.34 mg/dL — ABNORMAL HIGH (ref 0.70–1.28)
Glucose, Bld: 112 mg/dL — ABNORMAL HIGH (ref 65–99)
Potassium: 4.4 mmol/L (ref 3.5–5.3)
Sodium: 139 mmol/L (ref 135–146)
eGFR: 54 mL/min/{1.73_m2} — ABNORMAL LOW (ref >=60)

## 2022-04-30 LAB — MICROALBUMIN / CREATININE URINE RATIO
Creatinine, Urine: 264 mg/dL (ref 20–320)
Microalb Creat Ratio: 41 mcg/mg creat — ABNORMAL HIGH (ref <30)
Microalb, Ur: 10.8 mg/dL

## 2022-05-01 ENCOUNTER — Other Ambulatory Visit: Payer: Self-pay | Admitting: Sports Medicine

## 2022-05-01 DIAGNOSIS — M170 Bilateral primary osteoarthritis of knee: Secondary | ICD-10-CM

## 2022-05-03 NOTE — Progress Notes (Signed)
Hi Tristan Mayer, the protein levels in your urine are still mildly elevated but much better compared to a year ago.  This is reassuring.  Your kidney function is also better.  It was up to 1.5 and this time it was 1.3 which is fantastic.  Your A1c is up though to 7.0 goal is under 7 just really encouraged her to work on eating healthy cutting back on sweets and carbs.

## 2022-05-09 ENCOUNTER — Telehealth: Payer: Self-pay

## 2022-05-09 DIAGNOSIS — M47816 Spondylosis without myelopathy or radiculopathy, lumbar region: Secondary | ICD-10-CM | POA: Insufficient documentation

## 2022-05-09 MED ORDER — PREDNISONE 50 MG PO TABS: ORAL_TABLET | ORAL | 0 refills | Status: DC

## 2022-05-09 NOTE — Telephone Encounter (Signed)
I can add a short course of prednisone to hold him over.

## 2022-05-09 NOTE — Addendum Note (Signed)
Addended by: Silverio Decamp on: 05/09/2022 04:51 PM   Modules accepted: Orders

## 2022-05-09 NOTE — Telephone Encounter (Signed)
Patient left VM and states he has been having a lot of hip and back pain and he has a appointment with you on 05/23/22 and he doesn't think he can wait that long please advise.

## 2022-05-10 ENCOUNTER — Other Ambulatory Visit: Payer: Self-pay | Admitting: Family Medicine

## 2022-05-14 ENCOUNTER — Other Ambulatory Visit: Payer: Self-pay | Admitting: Sports Medicine

## 2022-05-14 ENCOUNTER — Other Ambulatory Visit: Payer: Self-pay | Admitting: Family Medicine

## 2022-05-14 DIAGNOSIS — M170 Bilateral primary osteoarthritis of knee: Secondary | ICD-10-CM

## 2022-05-19 ENCOUNTER — Other Ambulatory Visit: Payer: Self-pay | Admitting: Family Medicine

## 2022-05-20 ENCOUNTER — Other Ambulatory Visit: Payer: Self-pay | Admitting: Family Medicine

## 2022-05-20 DIAGNOSIS — I10 Essential (primary) hypertension: Secondary | ICD-10-CM

## 2022-05-21 ENCOUNTER — Other Ambulatory Visit: Payer: Self-pay | Admitting: Family Medicine

## 2022-05-21 DIAGNOSIS — E1169 Type 2 diabetes mellitus with other specified complication: Secondary | ICD-10-CM

## 2022-05-23 ENCOUNTER — Encounter: Payer: Self-pay | Admitting: Family Medicine

## 2022-05-23 ENCOUNTER — Ambulatory Visit (INDEPENDENT_AMBULATORY_CARE_PROVIDER_SITE_OTHER): Payer: Medicare Other | Admitting: Family Medicine

## 2022-05-23 ENCOUNTER — Ambulatory Visit (INDEPENDENT_AMBULATORY_CARE_PROVIDER_SITE_OTHER): Payer: Medicare Other | Admitting: Sports Medicine

## 2022-05-23 VITALS — BP 102/51 | HR 66 | Ht 70.0 in | Wt 226.0 lb

## 2022-05-23 DIAGNOSIS — M170 Bilateral primary osteoarthritis of knee: Secondary | ICD-10-CM

## 2022-05-23 DIAGNOSIS — I513 Intracardiac thrombosis, not elsewhere classified: Secondary | ICD-10-CM

## 2022-05-23 DIAGNOSIS — R296 Repeated falls: Secondary | ICD-10-CM | POA: Diagnosis not present

## 2022-05-23 DIAGNOSIS — Z794 Long term (current) use of insulin: Secondary | ICD-10-CM | POA: Diagnosis not present

## 2022-05-23 DIAGNOSIS — I480 Paroxysmal atrial fibrillation: Secondary | ICD-10-CM | POA: Diagnosis not present

## 2022-05-23 DIAGNOSIS — E118 Type 2 diabetes mellitus with unspecified complications: Secondary | ICD-10-CM | POA: Diagnosis not present

## 2022-05-23 DIAGNOSIS — I10 Essential (primary) hypertension: Secondary | ICD-10-CM

## 2022-05-23 NOTE — Progress Notes (Signed)
Pt stated that while he was bathing yesterday morning his hands went numb. This lasted for about 1 hour. Two of his fingers on his R hand stayed numb a little longer.

## 2022-05-23 NOTE — Progress Notes (Signed)
Established Patient Office Visit  Subjective   Patient ID: Tristan Mayer, male    DOB: 10/28/1940  Age: 82 y.o. MRN: [id number]  Chief Complaint  Patient presents with   Diabetes    HPI  Hypertension- Pt denies chest pain, SOB, dizziness, or heart palpitations.  Taking meds as directed w/o problems.  Denies medication side effects.  He has been doing well with the 1 mg semaglutide he is lost about 20 to 30 pounds.  We had previously tried going up to 2 mg but he did not tolerate it well so he is back at 1 mg.  His sugars have been running consistently under 100 fasting but nothing less than 80.  He says the lowest was 86.  Diabetes - no hypoglycemic events. No wounds or sores that are not healing well. No increased thirst or urination. Checking glucose at home. Taking medications as prescribed without any side effects.  Last A1c was 7.0.  He had an episode yesterday where his hands went numb it lasted for couple hours.  The left wound resolved more quickly than the right.  He said it happened during his shower has not happened again since then.  He reports that he is also been falling intermittently he has not hurt himself.  But he notices that he is catching his feet together or sometimes on uneven surfaces.  He denies feeling lightheaded or dizzy.      ROS    Objective:     BP (!) 102/51   Pulse 66   Ht '5\' 10"'$  (1.778 m)   Wt 226 lb (102.5 kg)   SpO2 98%   BMI 32.43 kg/m    Physical Exam Constitutional:      Appearance: He is well-developed.  HENT:     Head: Normocephalic and atraumatic.  Cardiovascular:     Rate and Rhythm: Normal rate and regular rhythm.     Heart sounds: Normal heart sounds.     Comments: 3/6 SEM Pulmonary:     Effort: Pulmonary effort is normal.     Breath sounds: Normal breath sounds.  Skin:    General: Skin is warm and dry.     Comments: A lot of bruising and scabs on his forearms and hands from his new puppy.  Neurological:     Mental  Status: He is alert and oriented to person, place, and time.  Psychiatric:        Behavior: Behavior normal.     No results found for any visits on 05/23/22.    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      Paroxysmal A-fib ([place])    Pressures have been low and has been having more frequent falls though he feels like it is really more of a balance issue but I am concerned that his blood pressure might be going even lower at times.  Several can hold his carvedilol until I see him back.  But monitor for any increase in heart rate since he does have a history of A-fib.  Continue Xarelto.      Benign essential hypertension - Primary    Blood pressure is actually  low on the low end.  I do have him hold his carvedilol for now and see if the blood pressures improve also want to see if maybe his falls improve.  He says he is not feeling lightheaded or dizzy when it happens but I am concerned that it could be contributing to him may be stepping a little bit more weekly and actually leading to a fall.        Endocrine   Controlled diabetes mellitus type 2 with complications ([place])    Decrease Tresiba to 40 units daily.  Continue semaglutide 1 mg as he did not tolerate the 2 mg dose.  Continue to work on [employer] and staying active.  Monitor for any lows.      Other Visit Diagnoses     Frequent falls           Return in about 3 months (around 08/22/2022) for Diabetes follow-up, Hypertension.    Beatrice Lecher, MD

## 2022-05-23 NOTE — Assessment & Plan Note (Addendum)
Pleasant 82 year old male, we did Orthovisc last year, finished in December, unfortunately he is having recurrence of pain. Last third injection was in October. Tramadol seems to control his pain fairly well but he is only taking it once a day. He is not yet ready to consider another injection, he is going on a cruise with his wife at the end of March and so he will return about a week before this cruise for repeat bilateral steroid knee injections. I have advised him to try to increase the tramadol to 2 or 3 times a day particular before playing golf. He is not interested in knee arthroplasty. Return to see me as needed.

## 2022-05-23 NOTE — Patient Instructions (Signed)
Lets hold your carvedilol for now and see how you do.  It is a rate controller so if we stop it your heart rate might increase.  A mild increase is okay but if you feel like your heart is going too fast please let us know.  Or if your heart is beating greater than 100 bpm at rest please let us know.  Decrease Tresiba to 40 units daily.

## 2022-05-23 NOTE — Assessment & Plan Note (Addendum)
Pressures have been low and has been having more frequent falls though he feels like it is really more of a balance issue but I am concerned that his blood pressure might be going even lower at times.  Several can hold his carvedilol until I see him back.  But monitor for any increase in heart rate since he does have a history of A-fib.  Continue Xarelto.

## 2022-05-23 NOTE — Assessment & Plan Note (Signed)
Continue Xarelto 

## 2022-05-23 NOTE — Progress Notes (Signed)
    Procedures performed today:    None.  Independent interpretation of notes and tests performed by another provider:   None.  Brief History, Exam, Impression, and Recommendations:    Primary osteoarthritis of both knees Pleasant 82 year old male, we did Orthovisc last year, finished in December, unfortunately he is having recurrence of pain. Last third injection was in October. Tramadol seems to control his pain fairly well but he is only taking it once a day. He is not yet ready to consider another injection, he is going on a cruise with his wife at the end of March and so he will return about a week before this cruise for repeat bilateral steroid knee injections. I have advised him to try to increase the tramadol to 2 or 3 times a day particular before playing golf. He is not interested in knee arthroplasty. Return to see me as needed.    ____________________________________________ Gwen Her. Dianah Field, M.D., ABFM., CAQSM., AME. Primary Care and Sports Medicine [place] [place] [facility]  Adjunct Professor of [employer] [facility] of Medicine  Risk manager

## 2022-05-23 NOTE — Assessment & Plan Note (Signed)
Decrease Tresiba to 40 units daily.  Continue semaglutide 1 mg as he did not tolerate the 2 mg dose.  Continue to work on [employer] and staying active.  Monitor for any lows.

## 2022-05-23 NOTE — Assessment & Plan Note (Signed)
Blood pressure is actually low on the low end.  I do have him hold his carvedilol for now and see if the blood pressures improve also want to see if maybe his falls improve.  He says he is not feeling lightheaded or dizzy when it happens but I am concerned that it could be contributing to him may be stepping a little bit more weekly and actually leading to a fall.

## 2022-07-04 ENCOUNTER — Other Ambulatory Visit: Payer: Self-pay | Admitting: Family Medicine

## 2022-07-04 DIAGNOSIS — Z794 Long term (current) use of insulin: Secondary | ICD-10-CM

## 2022-07-05 NOTE — Telephone Encounter (Signed)
Meds ordered this encounter  Medications   TRESIBA FLEXTOUCH 100 UNIT/ML FlexTouch Pen    Sig: INJECT 40-46 UNITS SUBCUTANEOUSLY ONCE DAILY.    Dispense:  24 mL    Refill:  0

## 2022-07-05 NOTE — Telephone Encounter (Signed)
Per provider's last note - Decrease Tresiba to 40 units daily.

## 2022-07-12 ENCOUNTER — Other Ambulatory Visit: Payer: Self-pay | Admitting: Family Medicine

## 2022-07-17 ENCOUNTER — Other Ambulatory Visit: Payer: Self-pay | Admitting: Family Medicine

## 2022-07-18 ENCOUNTER — Ambulatory Visit (INDEPENDENT_AMBULATORY_CARE_PROVIDER_SITE_OTHER): Payer: Medicare Other | Admitting: Sports Medicine

## 2022-07-18 DIAGNOSIS — M19041 Primary osteoarthritis, right hand: Secondary | ICD-10-CM

## 2022-07-18 DIAGNOSIS — M170 Bilateral primary osteoarthritis of knee: Secondary | ICD-10-CM | POA: Diagnosis not present

## 2022-07-18 MED ORDER — CELECOXIB 200 MG PO CAPS: 200.0000 mg | ORAL_CAPSULE | Freq: Every day | ORAL | 2 refills | Status: DC | PRN

## 2022-07-18 MED ORDER — HYDROCODONE-ACETAMINOPHEN 5-325 MG PO TABS: 1.0000 | ORAL_TABLET | Freq: Three times a day (TID) | ORAL | 0 refills | Status: DC | PRN

## 2022-07-18 NOTE — Progress Notes (Signed)
    Procedures performed today:    None.  Independent interpretation of notes and tests performed by another provider:   None.  Brief History, Exam, Impression, and Recommendations:    Primary osteoarthritis of both knees Tristan Mayer returns, he is a very pleasant 82 year old male, he has severe end-stage knee osteoarthritis, he has failed steroid injections, he still Orthovisc, this was started in December. He has failed conditioning, activity modification. Tylenol has not been effective and tramadol is not effective. We have been hesitant to use NSAIDs due to his Xarelto treatment. Today we have decided to start low-dose Celebrex with precautions given regarding GI bleed symptoms. We will also increase from tramadol to hydrocodone, I will also place a referral to pain management as we will not be able to do hydrocodone long-term.  I would like him to discuss medical pain management but also genicular radiofrequency ablation. He is still adamant to avoid knee replacement.  Primary osteoarthritis of right hand Right hand osteoarthritis with Bouchard and Heberden's nodes of right index finger, adding Celebrex as above, precautions given regarding GI bleeding with NSAIDs and Xarelto. Hand home conditioning given, return to see me in 4 weeks as needed, we can consider an interphalangeal joint injection if not better.    ____________________________________________ Gwen Her. Dianah Field, M.D., ABFM., CAQSM., AME. Primary Care and Sports Medicine [place] [facility]  Adjunct Professor of [employer] [facility] of Medicine  Risk manager

## 2022-07-18 NOTE — Assessment & Plan Note (Signed)
Tristan Mayer returns, he is a very pleasant 82 year old male, he has severe end-stage knee osteoarthritis, he has failed steroid injections, he still Orthovisc, this was started in December. He has failed conditioning, activity modification. Tylenol has not been effective and tramadol is not effective. We have been hesitant to use NSAIDs due to his Xarelto treatment. Today we have decided to start low-dose Celebrex with precautions given regarding GI bleed symptoms. We will also increase from tramadol to hydrocodone, I will also place a referral to pain management as we will not be able to do hydrocodone long-term.  I would like him to discuss medical pain management but also genicular radiofrequency ablation. He is still adamant to avoid knee replacement.

## 2022-07-18 NOTE — Assessment & Plan Note (Signed)
Right hand osteoarthritis with Bouchard and Heberden's nodes of right index finger, adding Celebrex as above, precautions given regarding GI bleeding with NSAIDs and Xarelto. Hand home conditioning given, return to see me in 4 weeks as needed, we can consider an interphalangeal joint injection if not better.

## 2022-07-27 ENCOUNTER — Other Ambulatory Visit: Payer: Self-pay | Admitting: Family Medicine

## 2022-07-27 DIAGNOSIS — N401 Enlarged prostate with lower urinary tract symptoms: Secondary | ICD-10-CM

## 2022-08-04 ENCOUNTER — Other Ambulatory Visit: Payer: Self-pay | Admitting: Sports Medicine

## 2022-08-04 DIAGNOSIS — M170 Bilateral primary osteoarthritis of knee: Secondary | ICD-10-CM

## 2022-08-14 ENCOUNTER — Other Ambulatory Visit: Payer: Self-pay | Admitting: Family Medicine

## 2022-08-16 ENCOUNTER — Ambulatory Visit (INDEPENDENT_AMBULATORY_CARE_PROVIDER_SITE_OTHER): Payer: Medicare Other | Admitting: Sports Medicine

## 2022-08-16 DIAGNOSIS — M170 Bilateral primary osteoarthritis of knee: Secondary | ICD-10-CM | POA: Diagnosis not present

## 2022-08-16 MED ORDER — HYDROCODONE-ACETAMINOPHEN 5-325 MG PO TABS: 1.0000 | ORAL_TABLET | Freq: Two times a day (BID) | ORAL | 0 refills | Status: DC | PRN

## 2022-08-16 NOTE — Progress Notes (Signed)
    Procedures performed today:    None.  Independent interpretation of notes and tests performed by another provider:   None.  Brief History, Exam, Impression, and Recommendations:    Primary osteoarthritis of both knees Coreg returns, he is a very pleasant 82 year old male, we have been treating him longitudinally for severe end-stage knee osteoarthritis, he has failed steroid injections, he failed Orthovisc started in December. He is has failed conditioning, activity modification, Tylenol not effective, tramadol not effective, unable to use NSAIDs due to Xarelto, we did try low-dose Celebrex with precautions regarding GI bleeding, he did develop some dark stools so discontinued Celebrex. Hydrocodone has been effective, I am happy to refill this but he understands this will be a single refill, I also discussed genicular radiofrequency ablation. Of note he cannot have arthroplasty for the time being due to his wife's work/insurance situation. We did refer him to Dr. Laurian Brim for discussion of genicular RFA but he has not followed through yet.    ____________________________________________ [name]. Benjamin Stain, M.D., ABFM., CAQSM., AME. Primary Care and Sports Medicine [place] [facility]  Adjunct Professor of Family Medicine  [place] [employer] [facility] of Medicine  Restaurant manager, fast food

## 2022-08-16 NOTE — Patient Instructions (Signed)
Look into genicular radiofrequency ablation.

## 2022-08-16 NOTE — Assessment & Plan Note (Signed)
Coreg returns, he is a very pleasant 82 year old male, we have been treating him longitudinally for severe end-stage knee osteoarthritis, he has failed steroid injections, he failed Orthovisc started in December. He is has failed conditioning, activity modification, Tylenol not effective, tramadol not effective, unable to use NSAIDs due to Xarelto, we did try low-dose Celebrex with precautions regarding GI bleeding, he did develop some dark stools so discontinued Celebrex. Hydrocodone has been effective, I am happy to refill this but he understands this will be a single refill, I also discussed genicular radiofrequency ablation. Of note he cannot have arthroplasty for the time being due to his wife's work/insurance situation. We did refer him to Dr. Laurian Brim for discussion of genicular RFA but he has not followed through yet.

## 2022-08-22 ENCOUNTER — Ambulatory Visit: Payer: Medicare Other | Admitting: Family Medicine

## 2022-08-22 ENCOUNTER — Other Ambulatory Visit: Payer: Self-pay | Admitting: Family Medicine

## 2022-08-30 DIAGNOSIS — M170 Bilateral primary osteoarthritis of knee: Secondary | ICD-10-CM | POA: Diagnosis not present

## 2022-08-30 DIAGNOSIS — G894 Chronic pain syndrome: Secondary | ICD-10-CM | POA: Diagnosis not present

## 2022-09-02 ENCOUNTER — Other Ambulatory Visit: Payer: Self-pay | Admitting: Family Medicine

## 2022-09-02 DIAGNOSIS — Z794 Long term (current) use of insulin: Secondary | ICD-10-CM

## 2022-09-02 DIAGNOSIS — E118 Type 2 diabetes mellitus with unspecified complications: Secondary | ICD-10-CM

## 2022-09-06 ENCOUNTER — Ambulatory Visit: Payer: Medicare Other | Admitting: Family Medicine

## 2022-09-06 NOTE — Progress Notes (Deleted)
   Established Patient Office Visit  Subjective   Patient ID: Tristan Mayer, male    DOB: [birth year]  Age: 82 y.o. MRN: [id number]  No chief complaint on file.   HPI   Diabetes - no hypoglycemic events. No wounds or sores that are not healing well. No increased thirst or urination. Checking glucose at home. Taking medications as prescribed without any side effects.  Hypertension- Pt denies chest pain, SOB, dizziness, or heart palpitations.  Taking meds as directed w/o problems.  Denies medication side effects.    {History (Optional):23778}  ROS    Objective:     There were no vitals taken for this visit. {Vitals History (Optional):23777}  Physical Exam   No results found for any visits on 09/06/22.  {Labs (Optional):23779}  The ASCVD Risk score (Arnett DK, et al., 2019) failed to calculate for the following reasons:   The 2019 ASCVD risk score is only valid for ages 13 to 32   The patient has a prior MI or stroke diagnosis    Assessment & Plan:   Problem List Items Addressed This Visit       Cardiovascular and Mediastinum   Benign essential hypertension - Primary     Endocrine   Controlled diabetes mellitus type 2 with complications (HCC)    No follow-ups on file.    [name], MD

## 2022-09-15 ENCOUNTER — Ambulatory Visit (INDEPENDENT_AMBULATORY_CARE_PROVIDER_SITE_OTHER): Payer: Medicare Other

## 2022-09-15 ENCOUNTER — Ambulatory Visit (INDEPENDENT_AMBULATORY_CARE_PROVIDER_SITE_OTHER): Payer: Medicare Other | Admitting: Family Medicine

## 2022-09-15 ENCOUNTER — Encounter: Payer: Self-pay | Admitting: Family Medicine

## 2022-09-15 VITALS — BP 135/57 | HR 69 | Ht 70.0 in | Wt 221.0 lb

## 2022-09-15 DIAGNOSIS — E785 Hyperlipidemia, unspecified: Secondary | ICD-10-CM | POA: Diagnosis not present

## 2022-09-15 DIAGNOSIS — I480 Paroxysmal atrial fibrillation: Secondary | ICD-10-CM

## 2022-09-15 DIAGNOSIS — I700 Atherosclerosis of aorta: Secondary | ICD-10-CM | POA: Diagnosis not present

## 2022-09-15 DIAGNOSIS — E1169 Type 2 diabetes mellitus with other specified complication: Secondary | ICD-10-CM | POA: Diagnosis not present

## 2022-09-15 DIAGNOSIS — Z794 Long term (current) use of insulin: Secondary | ICD-10-CM | POA: Diagnosis not present

## 2022-09-15 DIAGNOSIS — E118 Type 2 diabetes mellitus with unspecified complications: Secondary | ICD-10-CM | POA: Diagnosis not present

## 2022-09-15 DIAGNOSIS — R1111 Vomiting without nausea: Secondary | ICD-10-CM

## 2022-09-15 DIAGNOSIS — R1110 Vomiting, unspecified: Secondary | ICD-10-CM | POA: Diagnosis not present

## 2022-09-15 LAB — POCT GLYCOSYLATED HEMOGLOBIN (HGB A1C): Hemoglobin A1C: 7.5 % — AB (ref 4.0–5.6)

## 2022-09-15 NOTE — Assessment & Plan Note (Signed)
Continue daily statin. Due to recheck lipids

## 2022-09-15 NOTE — Progress Notes (Signed)
Pt was reminded about getting Shingrix and Tdap done. He stated that he doesn't want the Shingrix vaccine, he would go to pharmacy to get Tdap updated.

## 2022-09-15 NOTE — Assessment & Plan Note (Signed)
A1c went up to 7.5 today.  We discussed healthier food choices and decreasing his insulin at least by 2 units to see if that helps with hypoglycemia since he felt like dropping it by 5 units was too much.  We discussed that I rather have him run a little higher than having hypoglycemia.  And then trying to correct it and then overshooting with the glucose levels.

## 2022-09-15 NOTE — Patient Instructions (Signed)
Please dec tresiba to 48 units.

## 2022-09-15 NOTE — Assessment & Plan Note (Signed)
He is bradycardic today but asymptomatic.

## 2022-09-15 NOTE — Progress Notes (Signed)
Established Patient Office Visit  Subjective   Patient ID: Ramel Rodela, male    DOB: 1941-05-02  Age: 82 y.o. MRN: [id number]  Chief Complaint  Patient presents with   Diabetes    Eye exam requested from Dr. Hardie Shackleton. Pt stated that he is approaching the "DOUGHNUT HOLE"  for his Ozempic the insurance pays $1100 and his portion is $150 he would like to know what could be substituted for this and for his Xarelto   Hypertension    HPI   Diabetes -he reports that he has been having some hypoglycemic events several times a week.  Blood sugars will drop below 80.  He had tried to drop his [place] down to 45 units but then felt like his blood sugars were going too high.  He still eating a lot of bread, biscuits, and honey buns.. No wounds or sores that are not healing well. No increased thirst or urination. Checking glucose at home. Taking medications as prescribed without any side effects.  F/U AFIB -he is taking Xarelto but says that he thinks he is can be in his Medicare gap soon and the Xarelto is definitely more expensive.  He did want to let me know that he is also been having random episodes of vomiting.  He says he does not feel nauseated with it it just occurs suddenly.  It can happen anytime a day he has not noticed a pattern with any specific foods or necessarily overeating.  He is on Ozempic.  He says he normally moves his bowels twice a week but that is normal for him and he does not member the vomiting started before or after he started the Ozempic.     ROS    Objective:     BP (!) 135/57   Pulse 69   Ht 5\' 10"  (1.778 m)   Wt 221 lb (100.2 kg)   SpO2 97%   BMI 31.71 kg/m    Physical Exam Constitutional:      Appearance: He is well-developed.  HENT:     Head: Normocephalic and atraumatic.  Cardiovascular:     Rate and Rhythm: Normal rate and regular rhythm.     Heart sounds: Normal heart sounds.  Pulmonary:     Effort: Pulmonary effort is normal.     Breath  sounds: Normal breath sounds.  Skin:    General: Skin is warm and dry.  Neurological:     Mental Status: He is alert and oriented to person, place, and time.  Psychiatric:        Behavior: Behavior normal.      Results for orders placed or performed in visit on 09/15/22  POCT glycosylated hemoglobin (Hb A1C)  Result Value Ref Range   Hemoglobin A1C 7.5 (A) 4.0 - 5.6 %   HbA1c POC (<> result, manual entry)     HbA1c, POC (prediabetic range)     HbA1c, POC (controlled diabetic range)        The ASCVD Risk score (Arnett DK, et al., 2019) failed to calculate for the following reasons:   The 2019 ASCVD risk score is only valid for ages 53 to 52   The patient has a prior MI or stroke diagnosis    Assessment & Plan:   Problem List Items Addressed This Visit       Cardiovascular and Mediastinum   Paroxysmal A-fib (HCC) - Primary    He is bradycardic today but asymptomatic.       Relevant Orders  Lipid Panel w/reflex Direct LDL   COMPLETE METABOLIC PANEL WITH GFR   CBC   Aortic atherosclerosis (HCC)    Continue daily statin.       Relevant Orders   Lipid Panel w/reflex Direct LDL   COMPLETE METABOLIC PANEL WITH GFR   CBC     Endocrine   Hyperlipidemia associated with type 2 diabetes mellitus (HCC)    Continue daily statin. Due to recheck lipids      Relevant Orders   Lipid Panel w/reflex Direct LDL   COMPLETE METABOLIC PANEL WITH GFR   CBC   Controlled diabetes mellitus type 2 with complications (HCC)    A1c went up to 7.5 today.  We discussed healthier food choices and decreasing his insulin at least by 2 units to see if that helps with hypoglycemia since he felt like dropping it by 5 units was too much.  We discussed that I rather have him run a little higher than having hypoglycemia.  And then trying to correct it and then overshooting with the glucose levels.      Relevant Orders   Lipid Panel w/reflex Direct LDL   COMPLETE METABOLIC PANEL WITH GFR   CBC    POCT glycosylated hemoglobin (Hb A1C) (Completed)   Other Visit Diagnoses     Vomiting without nausea, unspecified vomiting type       Relevant Orders   DG Abd 1 View      Vomiting-he really does not feel nauseated.  His bowels only move twice a week but he says it is normal for him.  But I am concerned that he might have some constipation.  Will get a KUB today.  Consider GI referral he denies feeling like food is getting stuck.  Return in about 2 months (around 11/15/2022) for Diabetes follow-up.    [name], MD

## 2022-09-15 NOTE — Assessment & Plan Note (Signed)
Continue daily statin. 

## 2022-09-16 LAB — COMPLETE METABOLIC PANEL WITH GFR
AG Ratio: 1.8 (calc) (ref 1.0–2.5)
ALT: 16 U/L (ref 9–46)
AST: 14 U/L (ref 10–35)
Albumin: 4 g/dL (ref 3.6–5.1)
Alkaline phosphatase (APISO): 66 U/L (ref 35–144)
BUN/Creatinine Ratio: 16 (calc) (ref 6–22)
BUN: 26 mg/dL — ABNORMAL HIGH (ref 7–25)
CO2: 25 mmol/L (ref 20–32)
Calcium: 9.3 mg/dL (ref 8.6–10.3)
Chloride: 102 mmol/L (ref 98–110)
Creat: 1.65 mg/dL — ABNORMAL HIGH (ref 0.70–1.22)
Globulin: 2.2 g/dL (calc) (ref 1.9–3.7)
Glucose, Bld: 210 mg/dL — ABNORMAL HIGH (ref 65–99)
Potassium: 4.3 mmol/L (ref 3.5–5.3)
Sodium: 138 mmol/L (ref 135–146)
Total Bilirubin: 1.1 mg/dL (ref 0.2–1.2)
Total Protein: 6.2 g/dL (ref 6.1–8.1)
eGFR: 42 mL/min/{1.73_m2} — ABNORMAL LOW (ref >=60)

## 2022-09-16 LAB — CBC
HCT: 42.4 % (ref 38.5–50.0)
Hemoglobin: 13.8 g/dL (ref 13.2–17.1)
MCH: 28.2 pg (ref 27.0–33.0)
MCHC: 32.5 g/dL (ref 32.0–36.0)
MCV: 86.7 fL (ref 80.0–100.0)
MPV: 12.1 fL (ref 7.5–12.5)
Platelets: 220 10*3/uL (ref 140–400)
RBC: 4.89 10*6/uL (ref 4.20–5.80)
RDW: 13 % (ref 11.0–15.0)
WBC: 7.7 10*3/uL (ref 3.8–10.8)

## 2022-09-16 LAB — LIPID PANEL W/REFLEX DIRECT LDL
Cholesterol: 116 mg/dL (ref <200)
HDL: 37 mg/dL — ABNORMAL LOW (ref >=40)
LDL Cholesterol (Calc): 52 mg/dL (calc)
Non-HDL Cholesterol (Calc): 79 mg/dL (calc) (ref <130)
Total CHOL/HDL Ratio: 3.1 (calc) (ref <5.0)
Triglycerides: 195 mg/dL — ABNORMAL HIGH (ref <150)

## 2022-09-16 NOTE — Progress Notes (Signed)
Hi Tristan Mayer, kidney function went up slightly to 1.6 your normally between 1.3 and 1.5.  I do and keep a close eye on that plan to recheck your kidney function in about 6 weeks.  Make sure you are hydrating well liver looks good.  LDL cholesterol at goal.  Your triglycerides shot up.  They looked much better last year so just make sure you are continuing to work on low-carb diet so cutting back on the breads pastas rice and sweets.  Blood count looks good.  Please let us know you get your eye exam.  I am not sure if Tristan Mayer had a chance to ask you when you came in the other day.  But we do need to get an up-to-date copy of your eye exam the last 1 have on file was from 2022.  You may have had 2023 and maybe we just did not get a copy.

## 2022-09-20 ENCOUNTER — Other Ambulatory Visit: Payer: Self-pay | Admitting: *Deleted

## 2022-09-20 ENCOUNTER — Telehealth: Payer: Self-pay | Admitting: Family Medicine

## 2022-09-20 DIAGNOSIS — R7989 Other specified abnormal findings of blood chemistry: Secondary | ICD-10-CM

## 2022-09-20 NOTE — Progress Notes (Signed)
Hi Tristan Mayer, x-ray overall of the abdomen looks good no concerning findings no sign of obstruction which is reassuring.  We can always consider referral to GI for further workup of the intermittent vomiting since I do not have a great explanation.

## 2022-09-22 ENCOUNTER — Other Ambulatory Visit: Payer: Self-pay

## 2022-09-22 DIAGNOSIS — R799 Abnormal finding of blood chemistry, unspecified: Secondary | ICD-10-CM

## 2022-10-01 ENCOUNTER — Other Ambulatory Visit: Payer: Self-pay | Admitting: Sports Medicine

## 2022-10-01 DIAGNOSIS — M170 Bilateral primary osteoarthritis of knee: Secondary | ICD-10-CM

## 2022-10-12 DIAGNOSIS — M170 Bilateral primary osteoarthritis of knee: Secondary | ICD-10-CM | POA: Diagnosis not present

## 2022-10-12 DIAGNOSIS — G894 Chronic pain syndrome: Secondary | ICD-10-CM | POA: Diagnosis not present

## 2022-10-14 ENCOUNTER — Encounter: Payer: Self-pay | Admitting: Family Medicine

## 2022-10-25 ENCOUNTER — Telehealth: Payer: Self-pay | Admitting: Family Medicine

## 2022-10-25 DIAGNOSIS — E118 Type 2 diabetes mellitus with unspecified complications: Secondary | ICD-10-CM

## 2022-10-25 NOTE — Telephone Encounter (Signed)
Tristan Mayer stopped in this morning requesting a new script for Onetouch verio Reflect w/device kit and test strips. Pharmacy told him that they no longer carry this type, it is too old. Pharmacy is [facility] on [address] Phone number on file is correct.

## 2022-10-31 MED ORDER — LANCETS MISC. MISC
1.0000 | Freq: Three times a day (TID) | 99 refills | Status: AC
Start: 2022-10-31 — End: 2022-11-30

## 2022-10-31 MED ORDER — BLOOD GLUCOSE MONITORING SUPPL DEVI
1.0000 | Freq: Three times a day (TID) | 0 refills | Status: DC
Start: 2022-10-31 — End: 2023-11-16

## 2022-10-31 MED ORDER — BLOOD GLUCOSE TEST VI STRP
1.0000 | ORAL_STRIP | Freq: Three times a day (TID) | 99 refills | Status: AC
Start: 2022-10-31 — End: 2022-11-30

## 2022-10-31 MED ORDER — LANCET DEVICE MISC
1.0000 | Freq: Three times a day (TID) | 99 refills | Status: AC
Start: 2022-10-31 — End: 2022-11-30

## 2022-10-31 NOTE — Addendum Note (Signed)
Addended by: [name] D on: 10/31/2022 05:03 PM   Modules accepted: Orders

## 2022-10-31 NOTE — Telephone Encounter (Signed)
Meds ordered this encounter  Medications   Blood Glucose Monitoring Suppl DEVI    Sig: 1 each by Does not apply route in the morning, at noon, and at bedtime. May substitute to any manufacturer covered by patient's insurance.    Dispense:  1 each    Refill:  0   Glucose Blood (BLOOD GLUCOSE TEST STRIPS) STRP    Sig: 1 each by In Vitro route in the morning, at noon, and at bedtime. May substitute to any manufacturer covered by patient's insurance.    Dispense:  100 strip    Refill:  PRN   Lancet Device MISC    Sig: 1 each by Does not apply route in the morning, at noon, and at bedtime. May substitute to any manufacturer covered by patient's insurance.    Dispense:  1 each    Refill:  PRN   Lancets Misc. MISC    Sig: 1 each by Does not apply route in the morning, at noon, and at bedtime. May substitute to any manufacturer covered by patient's insurance.    Dispense:  100 each    Refill:  PRN

## 2022-11-01 ENCOUNTER — Other Ambulatory Visit: Payer: Self-pay | Admitting: Family Medicine

## 2022-11-01 DIAGNOSIS — Z794 Long term (current) use of insulin: Secondary | ICD-10-CM

## 2022-11-01 NOTE — Telephone Encounter (Signed)
Patient informed. 

## 2022-11-17 ENCOUNTER — Other Ambulatory Visit: Payer: Self-pay | Admitting: Family Medicine

## 2022-11-22 ENCOUNTER — Ambulatory Visit (INDEPENDENT_AMBULATORY_CARE_PROVIDER_SITE_OTHER): Payer: Medicare Other | Admitting: Family Medicine

## 2022-11-22 VITALS — BP 126/67 | HR 54 | Ht 70.0 in | Wt 221.0 lb

## 2022-11-22 DIAGNOSIS — Z794 Long term (current) use of insulin: Secondary | ICD-10-CM | POA: Diagnosis not present

## 2022-11-22 DIAGNOSIS — E118 Type 2 diabetes mellitus with unspecified complications: Secondary | ICD-10-CM

## 2022-11-22 DIAGNOSIS — I10 Essential (primary) hypertension: Secondary | ICD-10-CM | POA: Diagnosis not present

## 2022-11-22 DIAGNOSIS — I480 Paroxysmal atrial fibrillation: Secondary | ICD-10-CM | POA: Diagnosis not present

## 2022-11-22 LAB — POCT GLYCOSYLATED HEMOGLOBIN (HGB A1C): Hemoglobin A1C: 7.5 % — AB (ref 4.0–5.6)

## 2022-11-22 NOTE — Progress Notes (Signed)
Established Patient Office Visit  Subjective   Patient ID: Tristan Mayer, male    DOB: 04/12/1941  Age: 82 y.o. MRN: [id number]  Chief Complaint  Patient presents with   Diabetes   Hypertension    HPI  Diabetes - no hypoglycemic events. No wounds or sores that are not healing well. No increased thirst or urination. Checking glucose at home. Taking medications as prescribed without any side effects.he was having intermittent random vomiting when I last saw him.  On 45 units of Tresiba.  Does go golfing about 3 days/week but no in other activities.  Hypertension- Pt denies chest pain, SOB, dizziness, or heart palpitations.  Taking meds as directed w/o problems.  Denies medication side effects.    F/U afib -   no changes.  No chest pain.  Still occ vomiting randomly.  Not often but it just comes on suddenly without any nausea.  Has not been able to detect any patterns such as certain foods medications etc.     ROS    Objective:     BP 126/67   Pulse (!) 54   Ht 5\' 10"  (1.778 m)   Wt 221 lb (100.2 kg)   SpO2 99%   BMI 31.71 kg/m    Physical Exam Constitutional:      Appearance: He is well-developed.  HENT:     Head: Normocephalic and atraumatic.  Cardiovascular:     Rate and Rhythm: Normal rate and regular rhythm.     Heart sounds: Normal heart sounds.  Pulmonary:     Effort: Pulmonary effort is normal.     Breath sounds: Normal breath sounds.  Skin:    General: Skin is warm and dry.  Neurological:     Mental Status: He is alert and oriented to person, place, and time.  Psychiatric:        Behavior: Behavior normal.     Results for orders placed or performed in visit on 11/22/22  POCT glycosylated hemoglobin (Hb A1C)  Result Value Ref Range   Hemoglobin A1C 7.5 (A) 4.0 - 5.6 %   HbA1c POC (<> result, manual entry)     HbA1c, POC (prediabetic range)     HbA1c, POC (controlled diabetic range)        The ASCVD Risk score (Arnett DK, et al., 2019) failed  to calculate for the following reasons:   The 2019 ASCVD risk score is only valid for ages 9 to 38   The patient has a prior MI or stroke diagnosis    Assessment & Plan:   Problem List Items Addressed This Visit       Cardiovascular and Mediastinum   Paroxysmal A-fib (HCC)   Benign essential hypertension    Pressure looks great today.        Endocrine   Controlled diabetes mellitus type 2 with complications (HCC) - Primary    On the Ozempic.  He did not tolerate 2 mg so we will not be able to go up though I think that would be ideal.  We did discuss maybe next year switching him to [facility] to see if maybe he can tolerate it better so that we can really push his dose.  Just really encouraged him to continue to work on his diet.  He has noticed that when he eats chips and dips that it does drive his sugars up.  He has noticed that decreasing his Tresiba to 45 units has helped decrease some of the lows that he  was previously experiencing.  Plan to follow back up in 6 to 7 weeks.      Relevant Orders   POCT glycosylated hemoglobin (Hb A1C) (Completed)    Has not had his donut hole yet but likely will in the next couple of months though he is quite concerned about getting his medications covered during that time.  I did encourage him to let us know if he hits his Medicare gap and we can go from there.  Return in about 6 weeks (around 01/03/2023) for Diabetes follow-up.    [name], MD

## 2022-11-22 NOTE — Assessment & Plan Note (Signed)
On the Ozempic.  He did not tolerate 2 mg so we will not be able to go up though I think that would be ideal.  We did discuss maybe next year switching him to [facility] to see if maybe he can tolerate it better so that we can really push his dose.  Just really encouraged him to continue to work on his diet.  He has noticed that when he eats chips and dips that it does drive his sugars up.  He has noticed that decreasing his Tresiba to 45 units has helped decrease some of the lows that he was previously experiencing.  Plan to follow back up in 6 to 7 weeks.

## 2022-11-22 NOTE — Assessment & Plan Note (Signed)
Pressure looks great today. 

## 2022-11-25 ENCOUNTER — Other Ambulatory Visit: Payer: Self-pay | Admitting: Family Medicine

## 2022-11-25 DIAGNOSIS — N401 Enlarged prostate with lower urinary tract symptoms: Secondary | ICD-10-CM

## 2022-11-26 DIAGNOSIS — R319 Hematuria, unspecified: Secondary | ICD-10-CM | POA: Diagnosis not present

## 2022-11-26 DIAGNOSIS — N23 Unspecified renal colic: Secondary | ICD-10-CM | POA: Diagnosis not present

## 2022-11-26 DIAGNOSIS — N1330 Unspecified hydronephrosis: Secondary | ICD-10-CM | POA: Diagnosis not present

## 2022-11-26 DIAGNOSIS — R935 Abnormal findings on diagnostic imaging of other abdominal regions, including retroperitoneum: Secondary | ICD-10-CM | POA: Diagnosis not present

## 2022-11-26 DIAGNOSIS — K8020 Calculus of gallbladder without cholecystitis without obstruction: Secondary | ICD-10-CM | POA: Diagnosis not present

## 2022-11-26 DIAGNOSIS — R7989 Other specified abnormal findings of blood chemistry: Secondary | ICD-10-CM | POA: Diagnosis not present

## 2022-11-26 DIAGNOSIS — N210 Calculus in bladder: Secondary | ICD-10-CM | POA: Diagnosis not present

## 2022-11-26 DIAGNOSIS — R072 Precordial pain: Secondary | ICD-10-CM | POA: Diagnosis not present

## 2022-11-26 DIAGNOSIS — N132 Hydronephrosis with renal and ureteral calculous obstruction: Secondary | ICD-10-CM | POA: Diagnosis not present

## 2022-12-04 ENCOUNTER — Other Ambulatory Visit: Payer: Self-pay | Admitting: Family Medicine

## 2022-12-04 DIAGNOSIS — N401 Enlarged prostate with lower urinary tract symptoms: Secondary | ICD-10-CM

## 2022-12-18 ENCOUNTER — Other Ambulatory Visit: Payer: Self-pay | Admitting: Family Medicine

## 2022-12-18 DIAGNOSIS — Z794 Long term (current) use of insulin: Secondary | ICD-10-CM

## 2022-12-18 DIAGNOSIS — E118 Type 2 diabetes mellitus with unspecified complications: Secondary | ICD-10-CM

## 2022-12-27 DIAGNOSIS — M170 Bilateral primary osteoarthritis of knee: Secondary | ICD-10-CM | POA: Diagnosis not present

## 2022-12-27 DIAGNOSIS — M792 Neuralgia and neuritis, unspecified: Secondary | ICD-10-CM | POA: Diagnosis not present

## 2023-01-03 ENCOUNTER — Ambulatory Visit (INDEPENDENT_AMBULATORY_CARE_PROVIDER_SITE_OTHER): Payer: Medicare Other | Admitting: Family Medicine

## 2023-01-03 ENCOUNTER — Encounter: Payer: Self-pay | Admitting: Family Medicine

## 2023-01-03 VITALS — BP 112/62 | HR 65 | Ht 70.0 in | Wt 217.0 lb

## 2023-01-03 DIAGNOSIS — N1832 Chronic kidney disease, stage 3b: Secondary | ICD-10-CM | POA: Insufficient documentation

## 2023-01-03 DIAGNOSIS — I513 Intracardiac thrombosis, not elsewhere classified: Secondary | ICD-10-CM

## 2023-01-03 DIAGNOSIS — Z794 Long term (current) use of insulin: Secondary | ICD-10-CM | POA: Diagnosis not present

## 2023-01-03 DIAGNOSIS — Z23 Encounter for immunization: Secondary | ICD-10-CM | POA: Diagnosis not present

## 2023-01-03 DIAGNOSIS — E118 Type 2 diabetes mellitus with unspecified complications: Secondary | ICD-10-CM

## 2023-01-03 DIAGNOSIS — R7989 Other specified abnormal findings of blood chemistry: Secondary | ICD-10-CM | POA: Diagnosis not present

## 2023-01-03 DIAGNOSIS — Z6831 Body mass index (BMI) 31.0-31.9, adult: Secondary | ICD-10-CM

## 2023-01-03 DIAGNOSIS — I10 Essential (primary) hypertension: Secondary | ICD-10-CM | POA: Diagnosis not present

## 2023-01-03 NOTE — Assessment & Plan Note (Signed)
C went up just slightly from 7.5-7.8.  He knows he really needs to do better with his diet.  He actually has lost a little bit with the weight.  He is very hesitant to go up on his Ozempic because when he previously took 2 mg he did not feel well so for now we will continue with current regimen just really encouraged him to get back on track with diet and really work on cutting back on bread which is his weakness.  And follow-up in 3 months.

## 2023-01-03 NOTE — Progress Notes (Signed)
Established Patient Office Visit  Subjective   Patient ID: Tsering Mardirossian, male    DOB: [birth year]  Age: 82 y.o. MRN: [id number]  Chief Complaint  Patient presents with   Diabetes    HPI Diabetes - no hypoglycemic events. No wounds or sores that are not healing well. No increased thirst or urination. Checking glucose at home. Taking medications as prescribed without any side effects. He says he knows he needs to bette with his diet.    Hypertension- Pt denies chest pain, SOB, dizziness, or heart palpitations.  Taking meds as directed w/o problems.  Denies medication side effects.    Comprehensive Metabolic Panel - [facility]  Order: [id number] Component Ref Range & Units 1 mo ago  Na 136 - 146 mmol/L 139  Potassium 3.7 - 5.4 mmol/L 4.2  Cl 97 - 108 mmol/L 104  CO2 20 - 32 mmol/L 25  AGAP 7 - 16 mmol/L 10  Glucose 65 - 99 mg/dL 563 High   BUN 8 - 27 mg/dL 29 High   Creatinine 8.75 - 1.27 mg/dL 6.43 High   Ca 8.6 - 32.9 mg/dL 9.6  ALK PHOS 25 - 518 U/L 82  T Bili 0.00 - 1.20 mg/dL 8.41  Total Protein 6.0 - 8.5 gm/dL 6.3  Alb 3.5 - 4.7 gm/dL 4.2  GLOBULIN 1.5 - 4.5 gm/dL 2.1  ALBUMIN/GLOBULIN RATIO 1.1 - 2.5 2.0  BUN/CREAT RATIO 11.0 - 26.0 17.2  ALT 0 - 55 U/L 24  AST 0 - 40 U/L 23  eGFR mL/min/1.37m2 41       ROS    Objective:     BP 112/62   Pulse 65   Ht 5\' 10"  (1.778 m)   Wt 217 lb (98.4 kg)   SpO2 94%   BMI 31.14 kg/m    Physical Exam Vitals and nursing note reviewed.  Constitutional:      Appearance: Normal appearance.  HENT:     Head: Normocephalic and atraumatic.  Eyes:     Conjunctiva/sclera: Conjunctivae normal.  Cardiovascular:     Rate and Rhythm: Normal rate and regular rhythm.  Pulmonary:     Effort: Pulmonary effort is normal.     Breath sounds: Normal breath sounds.  Skin:    General: Skin is warm and dry.  Neurological:     Mental Status: He is alert.  Psychiatric:        Mood and Affect: Mood normal.       No results found for any visits on 01/03/23.    The ASCVD Risk score (Arnett DK, et al., 2019) failed to calculate for the following reasons:   The 2019 ASCVD risk score is only valid for ages 18 to 26   The patient has a prior MI or stroke diagnosis    Assessment & Plan:   Problem List Items Addressed This Visit       Cardiovascular and Mediastinum   Thrombsis of left atrial appendage without antecedent myocardial infarction    Continue low-dose carvedilol for now.      Benign essential hypertension    Pressure looks great but on the lower end of normal.  He says sometimes it goes 4.  Just encouraged him to make sure that he is staying well-hydrated especially when he is outside golfing.  And try to eat a protein snack not just Gatorade.  And also make sure just drinking plenty of regular water.        Endocrine   Controlled diabetes mellitus  type 2 with complications (HCC) - Primary    C went up just slightly from 7.5-7.8.  He knows he really needs to do better with his diet.  He actually has lost a little bit with the weight.  He is very hesitant to go up on his Ozempic because when he previously took 2 mg he did not feel well so for now we will continue with current regimen just really encouraged him to get back on track with diet and really work on cutting back on bread which is his weakness.  And follow-up in 3 months.        Genitourinary   CKD stage G3b/A2, GFR 30-44 and albumin creatinine ratio 30-299 mg/g (HCC)    Given we will continue to monitor renal function. Serum creatinine has been bouncing around between 1.4 and 1.6.  He did have some blood work done outside of our office recently.  So for now we will continue to monitor.  Would recommend avoiding NSAIDs make she can go ahead and add it to his allergy list so that we do not accidentally prescribe that for him and worsen renal function.        Other   BMI 31.0-31.9,adult    MI is now down to 31 which  is great he has lost some weight and is doing better in that regard.  I do think he is consuming less calories overall but is still eating a fairly high sugar diet.      Other Visit Diagnoses     Elevated serum creatinine       Encounter for immunization       Relevant Orders   Flu Vaccine Trivalent High Dose (Fluad) (Completed)      Serum creatinine has been bouncing around between 1.4 and 1.6.  He did have some blood work done outside of our office recently.  So for now we will continue to monitor.  Return in about 3 months (around 04/04/2023) for Diabetes follow-up.    [name], MD

## 2023-01-03 NOTE — Assessment & Plan Note (Signed)
Given we will continue to monitor renal function. Serum creatinine has been bouncing around between 1.4 and 1.6.  He did have some blood work done outside of our office recently.  So for now we will continue to monitor.  Would recommend avoiding NSAIDs make she can go ahead and add it to his allergy list so that we do not accidentally prescribe that for him and worsen renal function.

## 2023-01-03 NOTE — Assessment & Plan Note (Signed)
Pressure looks great but on the lower end of normal.  He says sometimes it goes 4.  Just encouraged him to make sure that he is staying well-hydrated especially when he is outside golfing.  And try to eat a protein snack not just Gatorade.  And also make sure just drinking plenty of regular water.

## 2023-01-03 NOTE — Assessment & Plan Note (Signed)
MI is now down to 46 which is great he has lost some weight and is doing better in that regard.  I do think he is consuming less calories overall but is still eating a fairly high sugar diet.

## 2023-01-03 NOTE — Assessment & Plan Note (Signed)
Continue low-dose carvedilol for now.

## 2023-01-04 ENCOUNTER — Ambulatory Visit (INDEPENDENT_AMBULATORY_CARE_PROVIDER_SITE_OTHER): Payer: Medicare Other | Admitting: Family Medicine

## 2023-01-04 DIAGNOSIS — Z0000 Encounter for general adult medical examination without abnormal findings: Secondary | ICD-10-CM | POA: Diagnosis not present

## 2023-01-04 NOTE — Patient Instructions (Addendum)
MEDICARE ANNUAL WELLNESS VISIT Health Maintenance Summary and Written Plan of Care  [name] ,  Thank you for allowing me to perform your Medicare Annual Wellness Visit and for your ongoing commitment to your health.   Health Maintenance & Immunization History Health Maintenance  Topic Date Due   DTaP/Tdap/Td (2 - Td or Tdap) 05/03/2019   COVID-19 Vaccine (3 - 2023-24 season) 01/20/2023 (Originally 01/01/2023)   Zoster Vaccines- Shingrix (1 of 2) 08/22/2023 (Originally 07/28/1992)   Diabetic kidney evaluation - Urine ACR  04/30/2023   OPHTHALMOLOGY EXAM  05/07/2023   HEMOGLOBIN A1C  05/25/2023   Diabetic kidney evaluation - eGFR measurement  09/15/2023   FOOT EXAM  09/15/2023   Medicare Annual Wellness (AWV)  01/04/2024   Pneumonia Vaccine 35+ Years old  Completed   INFLUENZA VACCINE  Completed   HPV VACCINES  Aged Out   Hepatitis C Screening  Discontinued   Immunization History  Administered Date(s) Administered   Fluad Quad(high Dose 65+) 02/25/2019, 01/14/2021, 01/17/2022   Fluad Trivalent(High Dose 65+) 01/03/2023   Influenza, High Dose Seasonal PF 02/18/2015, 02/06/2017, 01/12/2018, 03/03/2020   Influenza, Seasonal, Injecte, Preservative Fre 02/08/2016   Influenza-Unspecified 02/08/2016, 03/02/2017   Janssen (J&J) SARS-COV-2 Vaccination 07/13/2019, 05/18/2020   PPD Test 03/02/2017   Pneumococcal Conjugate-13 01/19/2016   Pneumococcal Polysaccharide-23 09/30/2000, 02/28/2013   Tdap 05/02/2009    These are the patient goals that we discussed:  Goals Addressed               This Visit's Progress     Patient Stated (pt-stated)        Patient stated that he would like to maintain his weight loss.         This is a list of Health Maintenance Items that are overdue or due now: Health Maintenance Due  Topic Date Due   DTaP/Tdap/Td (2 - Td or Tdap) 05/03/2019   Td vaccine Shingles vaccine  Orders/Referrals Placed Today: No orders of the defined types were  placed in this encounter.  (Contact our referral department at [phone] if you have not spoken with someone about your referral appointment within the next 5 days)    Follow-up Plan Follow-up with Agapito Games, MD as planned Schedule tetanus shot and shingles vaccine at the pharmacy. Medicare wellness visit in one year.  Patient will access AVS on my chart.      Health Maintenance, Male Adopting a healthy lifestyle and getting preventive care are important in promoting health and wellness. Ask your health care provider about: The right schedule for you to have regular tests and exams. Things you can do on your own to prevent diseases and keep yourself healthy. What should I know about diet, weight, and exercise? Eat a healthy diet  Eat a diet that includes plenty of vegetables, fruits, low-fat dairy products, and lean protein. Do not eat a lot of foods that are high in solid fats, added sugars, or sodium. Maintain a healthy weight Body mass index (BMI) is a measurement that can be used to identify possible weight problems. It estimates body fat based on height and weight. Your health care provider can help determine your BMI and help you achieve or maintain a healthy weight. Get regular exercise Get regular exercise. This is one of the most important things you can do for your health. Most adults should: Exercise for at least 150 minutes each week. The exercise should increase your heart rate and make you sweat (moderate-intensity exercise). Do strengthening  exercises at least twice a week. This is in addition to the moderate-intensity exercise. Spend less time sitting. Even light physical activity can be beneficial. Watch cholesterol and blood lipids Have your blood tested for lipids and cholesterol at 80 years of age, then have this test every 5 years. You may need to have your cholesterol levels checked more often if: Your lipid or cholesterol levels are high. You  are older than 80 years of age. You are at high risk for heart disease. What should I know about cancer screening? Many types of cancers can be detected early and may often be prevented. Depending on your health history and family history, you may need to have cancer screening at various ages. This may include screening for: Colorectal cancer. Prostate cancer. Skin cancer. Lung cancer. What should I know about heart disease, diabetes, and high blood pressure? Blood pressure and heart disease High blood pressure causes heart disease and increases the risk of stroke. This is more likely to develop in people who have high blood pressure readings or are overweight. Talk with your health care provider about your target blood pressure readings. Have your blood pressure checked: Every 3-5 years if you are 32-76 years of age. Every year if you are 31 years old or older. If you are between the ages of 19 and 60 and are a current or former smoker, ask your health care provider if you should have a one-time screening for abdominal aortic aneurysm (AAA). Diabetes Have regular diabetes screenings. This checks your fasting blood sugar level. Have the screening done: Once every three years after age 91 if you are at a normal weight and have a low risk for diabetes. More often and at a younger age if you are overweight or have a high risk for diabetes. What should I know about preventing infection? Hepatitis B If you have a higher risk for hepatitis B, you should be screened for this virus. Talk with your health care provider to find out if you are at risk for hepatitis B infection. Hepatitis C Blood testing is recommended for: Everyone born from [birth year] through 1963. Anyone with known risk factors for hepatitis C. Sexually transmitted infections (STIs) You should be screened each year for STIs, including gonorrhea and chlamydia, if: You are sexually active and are younger than 80 years of age. You are  older than 80 years of age and your health care provider tells you that you are at risk for this type of infection. Your sexual activity has changed since you were last screened, and you are at increased risk for chlamydia or gonorrhea. Ask your health care provider if you are at risk. Ask your health care provider about whether you are at high risk for HIV. Your health care provider may recommend a prescription medicine to help prevent HIV infection. If you choose to take medicine to prevent HIV, you should first get tested for HIV. You should then be tested every 3 months for as long as you are taking the medicine. Follow these instructions at home: Alcohol use Do not drink alcohol if your health care provider tells you not to drink. If you drink alcohol: Limit how much you have to 0-2 drinks a day. Know how much alcohol is in your drink. In the [place] one drink equals one 12 oz bottle of beer (355 mL), one 5 oz glass of wine (148 mL), or one 1 oz glass of hard liquor (44 mL). Lifestyle Do not use any products  that contain nicotine or tobacco. These products include cigarettes, chewing tobacco, and vaping devices, such as e-cigarettes. If you need help quitting, ask your health care provider. Do not use street drugs. Do not share needles. Ask your health care provider for help if you need support or information about quitting drugs. General instructions Schedule regular health, dental, and eye exams. Stay current with your vaccines. Tell your health care provider if: You often feel depressed. You have ever been abused or do not feel safe at home. Summary Adopting a healthy lifestyle and getting preventive care are important in promoting health and wellness. Follow your health care provider's instructions about healthy diet, exercising, and getting tested or screened for diseases. Follow your health care provider's instructions on monitoring your cholesterol and blood pressure. This  information is not intended to replace advice given to you by your health care provider. Make sure you discuss any questions you have with your health care provider. Document Revised: 09/07/2020 Document Reviewed: 09/07/2020 Elsevier Patient Education  2024 [employer].

## 2023-01-04 NOTE — Progress Notes (Signed)
MEDICARE ANNUAL WELLNESS VISIT  01/04/2023  Telephone Visit Disclaimer This Medicare AWV was conducted by telephone due to national recommendations for restrictions regarding the COVID-19 Pandemic (e.g. social distancing).  I verified, using two identifiers, that I am speaking with Tristan Mayer or their authorized healthcare agent. I discussed the limitations, risks, security, and privacy concerns of performing an evaluation and management service by telephone and the potential availability of an in-person appointment in the future. The patient expressed understanding and agreed to proceed.  Location of Patient: Home Location of Provider (nurse):  In the office.  Subjective:    Tristan Mayer is a 82 y.o. male patient of Tristan Mayer, Tristan Ehlers, MD who had a Medicare Annual Wellness Visit today via telephone. Tristan Mayer is Retired and lives with their spouse. he has 3 children. he reports that he is socially active and does interact with friends/family regularly. he is moderately physically active and enjoys playing golf three times a week.  Patient Care Team: Agapito Games, MD as PCP - General (Family Medicine) Gabriel Carina, [facility] as Pharmacist (Pharmacist) Cain Saupe, MD as Referring Physician (Ophthalmology)     01/04/2023    8:02 AM 12/29/2021    8:08 AM 07/27/2020    9:18 AM 01/08/2018    2:06 PM 07/03/2017   11:18 AM  Advanced Directives  Does Patient Have a Medical Advance Directive? Yes Yes Yes Yes Yes  Type of Advance Directive Living will;Healthcare Power of Attorney Living will Healthcare Power of [place];Living will Healthcare Power of [place];Living will Healthcare Power of [place];Living will  Does patient want to make changes to medical advance directive? No - Patient declined No - Patient declined No - Patient declined  No - Patient declined  Copy of Healthcare Power of Attorney in Chart? No - copy requested  No - copy requested Yes No - copy requested     Hospital Utilization Over the Past 12 Months: # of hospitalizations or ER visits: 1 # of surgeries: 1  Review of Systems    Patient reports that his overall health is unchanged compared to last year.  History obtained from chart review and the patient  Patient Reported Readings (BP, Pulse, CBG, Weight, etc) none Per patient no change in vitals since last visit, unable to obtain new vitals due to telehealth visit  Pain Assessment Pain : No/denies pain     Current Medications & Allergies (verified) Allergies as of 01/04/2023       Reactions   Statins Other (See Comments)   Other reaction(s): Other All statin drugs Leg cramps when he was on atorvastatin and fenofibrate simultaneously. Discussed with his PCP Dr. Gary Mayer on the phone [phone]) All statin drugs Leg cramps when he was on atorvastatin and fenofibrate simultaneously. Discussed with his PCP Dr. Gary Mayer on the phone [phone]) All statin drugs   Diltiazem Other (See Comments)   Chest pain   Eliquis [apixaban] Other (See Comments)   Feels weak    Lisinopril Other (See Comments)   Weakness   Nsaids Other (See Comments)   Renal function         Medication List        Accurate as of January 04, 2023  8:12 AM. If you have any questions, ask your nurse or doctor.          Accu-Chek Guide Control Liqd   Blood Glucose Monitoring Suppl Devi 1 each by Does not apply route in the morning, at noon, and at bedtime. May substitute  to any manufacturer covered by patient's insurance.   carvedilol 3.125 MG tablet Commonly known as: COREG TAKE 1 TABLET BY MOUTH TWICE DAILY WITH A MEAL What changed: when to take this   Co-Enzyme Q-10 30 MG Caps Take 400 mg by mouth daily.   finasteride 5 MG tablet Commonly known as: PROSCAR TAKE 1 TABLET BY MOUTH AT BEDTIME   gabapentin 300 MG capsule Commonly known as: NEURONTIN TAKE 2 CAPSULES BY MOUTH IN THE MORNING AND 3 IN THE EVENING   losartan 25 MG  tablet Commonly known as: COZAAR Take 1 tablet by mouth once daily   metFORMIN 1000 MG tablet Commonly known as: GLUCOPHAGE TAKE 1 TABLET BY MOUTH TWICE DAILY WITH MEALS   Ozempic (1 MG/DOSE) 4 MG/3ML Sopn Generic drug: Semaglutide (1 MG/DOSE) INJECT 1 MG SUBCUTANEOUSLY ONCE A WEEK   rosuvastatin 40 MG tablet Commonly known as: CRESTOR Take 1 tablet (40 mg total) by mouth at bedtime.   sertraline 100 MG tablet Commonly known as: ZOLOFT Take 1 tablet by mouth once daily   tamsulosin 0.4 MG Caps capsule Commonly known as: FLOMAX Take 1 capsule by mouth twice daily   traMADol 50 MG tablet Commonly known as: ULTRAM TAKE 1 TABLET BY MOUTH EVERY 8 HOURS AS NEEDED   Tresiba FlexTouch 100 UNIT/ML FlexTouch Pen Generic drug: insulin degludec INJECT 40 TO 46 UNITS SUBCUTANEOUSLY ONCE DAILY   Ultra Thin Lancets 31G Misc   Xarelto 20 MG Tabs tablet Generic drug: rivaroxaban TAKE 1 TABLET BY MOUTH ONCE DAILY WITH SUPPER        History (reviewed): Past Medical History:  Diagnosis Date   Atrial fibrillation (HCC)    CVA (cerebral vascular accident) (HCC)    Diabetes mellitus (HCC)    GERD (gastroesophageal reflux disease)    Hyperlipidemia    PUD (peptic ulcer disease)    Past Surgical History:  Procedure Laterality Date   CATARACT EXTRACTION W/ INTRAOCULAR LENS IMPLANT Right    Family History  Problem Relation Age of Onset   Prostate cancer Father 53   Stroke Father    Diabetes Brother    Diabetes Brother    Atrial fibrillation Mother    Social History   Socioeconomic History   Marital status: Married    Spouse name: Tristan Mayer   Number of children: 3   Years of education: 12th Grade   Highest education level: 12th grade  Occupational History   Occupation: Retired  Tobacco Use   Smoking status: Some Days    Types: Cigars   Smokeless tobacco: Never   Tobacco comments:    Cigar only when playing golf   Vaping Use   Vaping status: Never Used  Substance and  Sexual Activity   Alcohol use: Yes    Comment: Occasional   Drug use: Never   Sexual activity: Not on file  Other Topics Concern   Not on file  Social History Narrative   Lives with his wife. He enjoys playing golf three times a week.   Social Determinants of Health   Financial Resource Strain: Low Risk  (01/04/2023)   Overall Financial Resource Strain (CARDIA)    Difficulty of Paying Living Expenses: Not hard at all  Food Insecurity: No Food Insecurity (01/04/2023)   Hunger Vital Sign    Worried About Running Out of Food in the Last Year: Never true    Ran Out of Food in the Last Year: Never true  Transportation Needs: No Transportation Needs (01/04/2023)   PRAPARE -  Administrator, Civil Service (Medical): No    Lack of Transportation (Non-Medical): No  Physical Activity: Sufficiently Active (01/04/2023)   Exercise Vital Sign    Days of Exercise per Week: 3 days    Minutes of Exercise per Session: 150+ min  Stress: No Stress Concern Present (01/04/2023)   [employer] of Occupational Health - Occupational Stress Questionnaire    Feeling of Stress : Not at all  Social Connections: Moderately Integrated (01/04/2023)   Social Connection and Isolation Panel [NHANES]    Frequency of Communication with Friends and Family: More than three times a week    Frequency of Social Gatherings with Friends and Family: Three times a week    Attends Religious Services: More than 4 times per year    Active Member of Clubs or Organizations: No    Attends Banker Meetings: Never    Marital Status: Married    Activities of Daily Living    01/04/2023    8:06 AM  In your present state of health, do you have any difficulty performing the following activities:  Hearing? 0  Comment wears bilateral hearing aids  Vision? 0  Comment wears glasses  Difficulty concentrating or making decisions? 0  Walking or climbing stairs? 0  Dressing or bathing? 0  Doing errands, shopping? 0   Preparing Food and eating ? N  Using the Toilet? N  In the past six months, have you accidently leaked urine? N  Do you have problems with loss of bowel control? N  Managing your Medications? N  Managing your Finances? N  Housekeeping or managing your Housekeeping? N    Patient Education/ Literacy How often do you need to have someone help you when you read instructions, pamphlets, or other written materials from your doctor or pharmacy?: 1 - Never What is the last grade level you completed in school?: 12th grade  Exercise    Diet Patient reports consuming 3 meals a day and 0 snack(s) a day Patient reports that his primary diet is: Regular Patient reports that she does have regular access to food.   Depression Screen    01/04/2023    8:03 AM 11/22/2022   10:04 AM 01/17/2022    9:41 AM 12/29/2021    8:09 AM 10/05/2021    3:50 PM 06/02/2021   10:10 AM 07/27/2020    9:19 AM  PHQ 2/9 Scores  PHQ - 2 Score 0 0 0 0 0 0 0     Fall Risk    01/04/2023    8:03 AM 11/22/2022   10:10 AM 11/22/2022   10:04 AM 09/15/2022   10:18 AM 05/23/2022    9:31 AM  Fall Risk   Falls in the past year? 1 1 0 1 1  Number falls in past yr: 1 1 0 1 0  Injury with Fall? 0 0 0 0 0  Risk for fall due to : History of fall(s) History of fall(s) No Fall Risks Impaired balance/gait History of fall(s);No Fall Risks  Follow up Falls evaluation completed;Education provided;Falls prevention discussed Falls evaluation completed Falls evaluation completed Falls evaluation completed Falls evaluation completed;Falls prevention discussed     Objective:  Haoran Starwalt seemed alert and oriented and he participated appropriately during our telephone visit.  Blood Pressure Weight BMI  BP Readings from Last 3 Encounters:  01/03/23 112/62  11/22/22 126/67  09/15/22 (!) 135/57   Wt Readings from Last 3 Encounters:  01/03/23 217 lb (98.4 kg)  11/22/22 221 lb (100.2 kg)  09/15/22 221 lb (100.2 kg)   BMI Readings from Last  1 Encounters:  01/03/23 31.14 kg/m    *Unable to obtain current vital signs, weight, and BMI due to telephone visit type  Hearing/Vision  Jomarie Longs did not seem to have difficulty with hearing/understanding during the telephone conversation Reports that he has had a formal eye exam by an eye care professional within the past year Reports that he has not had a formal hearing evaluation within the past year *Unable to fully assess hearing and vision during telephone visit type  Cognitive Function:    01/04/2023    8:07 AM 12/29/2021    8:13 AM 07/27/2020    9:24 AM  6CIT Screen  What Year? 0 points 0 points 0 points  What month? 0 points 0 points 0 points  What time? 0 points 0 points 0 points  Count back from 20 0 points 0 points 0 points  Months in reverse 0 points 2 points 0 points  Repeat phrase 2 points 2 points 0 points  Total Score 2 points 4 points 0 points   (Normal:0-7, Significant for Dysfunction: >8)  Normal Cognitive Function Screening: Yes   Immunization & Health Maintenance Record Immunization History  Administered Date(s) Administered   Fluad Quad(high Dose 65+) 02/25/2019, 01/14/2021, 01/17/2022   Fluad Trivalent(High Dose 65+) 01/03/2023   Influenza, High Dose Seasonal PF 02/18/2015, 02/06/2017, 01/12/2018, 03/03/2020   Influenza, Seasonal, Injecte, Preservative Fre 02/08/2016   Influenza-Unspecified 02/08/2016, 03/02/2017   Janssen (J&J) SARS-COV-2 Vaccination 07/13/2019, 05/18/2020   PPD Test 03/02/2017   Pneumococcal Conjugate-13 01/19/2016   Pneumococcal Polysaccharide-23 09/30/2000, 02/28/2013   Tdap 05/02/2009    Health Maintenance  Topic Date Due   DTaP/Tdap/Td (2 - Td or Tdap) 05/03/2019   COVID-19 Vaccine (3 - 2023-24 season) 01/20/2023 (Originally 01/01/2023)   Zoster Vaccines- Shingrix (1 of 2) 08/22/2023 (Originally 07/28/1992)   Diabetic kidney evaluation - Urine ACR  04/30/2023   OPHTHALMOLOGY EXAM  05/07/2023   HEMOGLOBIN A1C  05/25/2023    Diabetic kidney evaluation - eGFR measurement  09/15/2023   FOOT EXAM  09/15/2023   Medicare Annual Wellness (AWV)  01/04/2024   Pneumonia Vaccine 78+ Years old  Completed   INFLUENZA VACCINE  Completed   HPV VACCINES  Aged Out   Hepatitis C Screening  Discontinued       Assessment  This is a routine wellness examination for Tristan Mayer.  Health Maintenance: Due or Overdue Health Maintenance Due  Topic Date Due   DTaP/Tdap/Td (2 - Td or Tdap) 05/03/2019    Tristan Mayer does not need a referral for Community Assistance: Care Management:   no Social Work:    no Prescription Assistance:  no Nutrition/Diabetes Education:  no   Plan:  Personalized Goals  Goals Addressed               This Visit's Progress     Patient Stated (pt-stated)        Patient stated that he would like to maintain his weight loss.       Personalized Health Maintenance & Screening Recommendations  Td vaccine Shingles vaccine  Lung Cancer Screening Recommended: no (Low Dose CT Chest recommended if Age 80-80 years, 20 pack-year currently smoking OR have quit w/in past 15 years) Hepatitis C Screening recommended: no HIV Screening recommended: no  Advanced Directives: Written information was not prepared per patient's request.  Referrals & Orders No orders of the defined types were placed  in this encounter.   Follow-up Plan Follow-up with Agapito Games, MD as planned Schedule tetanus shot and shingles vaccine at the pharmacy. Medicare wellness visit in one year.  Patient will access AVS on my chart.   I have personally reviewed and noted the following in the patient's chart:   Medical and social history Use of alcohol, tobacco or illicit drugs  Current medications and supplements Functional ability and status Nutritional status Physical activity Advanced directives List of other physicians Hospitalizations, surgeries, and ER visits in previous 12 months Vitals Screenings  to include cognitive, depression, and falls Referrals and appointments  In addition, I have reviewed and discussed with Tristan Mayer certain preventive protocols, quality metrics, and best practice recommendations. A written personalized care plan for preventive services as well as general preventive health recommendations is available and can be mailed to the patient at his request.      Modesto Charon, RN BSN  01/04/2023

## 2023-01-11 ENCOUNTER — Other Ambulatory Visit: Payer: Self-pay | Admitting: Family Medicine

## 2023-01-13 DIAGNOSIS — Z87442 Personal history of urinary calculi: Secondary | ICD-10-CM | POA: Diagnosis not present

## 2023-01-13 DIAGNOSIS — N138 Other obstructive and reflux uropathy: Secondary | ICD-10-CM | POA: Diagnosis not present

## 2023-02-07 DIAGNOSIS — M170 Bilateral primary osteoarthritis of knee: Secondary | ICD-10-CM | POA: Diagnosis not present

## 2023-02-13 ENCOUNTER — Other Ambulatory Visit: Payer: Self-pay | Admitting: Family Medicine

## 2023-02-16 ENCOUNTER — Other Ambulatory Visit: Payer: Self-pay | Admitting: Family Medicine

## 2023-02-16 DIAGNOSIS — E118 Type 2 diabetes mellitus with unspecified complications: Secondary | ICD-10-CM

## 2023-02-19 ENCOUNTER — Other Ambulatory Visit: Payer: Self-pay | Admitting: Family Medicine

## 2023-02-28 ENCOUNTER — Other Ambulatory Visit: Payer: Self-pay | Admitting: Family Medicine

## 2023-02-28 DIAGNOSIS — N401 Enlarged prostate with lower urinary tract symptoms: Secondary | ICD-10-CM

## 2023-03-09 ENCOUNTER — Other Ambulatory Visit: Payer: Self-pay | Admitting: Family Medicine

## 2023-03-20 ENCOUNTER — Other Ambulatory Visit: Payer: Self-pay | Admitting: Family Medicine

## 2023-04-04 ENCOUNTER — Encounter: Payer: Self-pay | Admitting: Family Medicine

## 2023-04-04 ENCOUNTER — Ambulatory Visit: Payer: Medicare Other | Admitting: Family Medicine

## 2023-04-04 ENCOUNTER — Ambulatory Visit (INDEPENDENT_AMBULATORY_CARE_PROVIDER_SITE_OTHER): Payer: Medicare Other | Admitting: Family Medicine

## 2023-04-04 VITALS — BP 126/55 | HR 63 | Ht 70.0 in | Wt 214.0 lb

## 2023-04-04 DIAGNOSIS — M47816 Spondylosis without myelopathy or radiculopathy, lumbar region: Secondary | ICD-10-CM

## 2023-04-04 DIAGNOSIS — E785 Hyperlipidemia, unspecified: Secondary | ICD-10-CM

## 2023-04-04 DIAGNOSIS — E118 Type 2 diabetes mellitus with unspecified complications: Secondary | ICD-10-CM

## 2023-04-04 DIAGNOSIS — Z794 Long term (current) use of insulin: Secondary | ICD-10-CM

## 2023-04-04 DIAGNOSIS — I10 Essential (primary) hypertension: Secondary | ICD-10-CM

## 2023-04-04 DIAGNOSIS — Z9181 History of falling: Secondary | ICD-10-CM | POA: Diagnosis not present

## 2023-04-04 DIAGNOSIS — N1832 Chronic kidney disease, stage 3b: Secondary | ICD-10-CM

## 2023-04-04 DIAGNOSIS — Z8262 Family history of osteoporosis: Secondary | ICD-10-CM

## 2023-04-04 DIAGNOSIS — I739 Peripheral vascular disease, unspecified: Secondary | ICD-10-CM

## 2023-04-04 DIAGNOSIS — R2681 Unsteadiness on feet: Secondary | ICD-10-CM

## 2023-04-04 DIAGNOSIS — E1169 Type 2 diabetes mellitus with other specified complication: Secondary | ICD-10-CM | POA: Diagnosis not present

## 2023-04-04 DIAGNOSIS — R29898 Other symptoms and signs involving the musculoskeletal system: Secondary | ICD-10-CM | POA: Insufficient documentation

## 2023-04-04 DIAGNOSIS — G609 Hereditary and idiopathic neuropathy, unspecified: Secondary | ICD-10-CM

## 2023-04-04 LAB — POCT GLYCOSYLATED HEMOGLOBIN (HGB A1C): Hemoglobin A1C: 7.1 % — AB (ref 4.0–5.6)

## 2023-04-04 MED ORDER — FREESTYLE LIBRE 3 SENSOR MISC: 99 refills | Status: DC

## 2023-04-04 NOTE — Progress Notes (Unsigned)
   Established Patient Office Visit  Subjective   Patient ID: Tristan Mayer, male    DOB: 1940-07-12  Age: 82 y.o. MRN: [id number]  No chief complaint on file.   HPI   Diabetes - no hypoglycemic events. No wounds or sores that are not healing well. No increased thirst or urination. Checking glucose at home. Taking medications as prescribed without any side effects.  Hypertension- Pt denies chest pain, SOB, dizziness, or heart palpitations.  Taking meds as directed w/o problems.  Denies medication side effects.    F/U CKD 3 -   {History (Optional):23778}  ROS    Objective:     There were no vitals taken for this visit. {Vitals History (Optional):23777}  Physical Exam   No results found for any visits on 04/04/23.  {Labs (Optional):23779}  The ASCVD Risk score (Arnett DK, et al., 2019) failed to calculate for the following reasons:   The 2019 ASCVD risk score is only valid for ages 3 to 60   The patient has a prior MI or stroke diagnosis    Assessment & Plan:   Problem List Items Addressed This Visit       Cardiovascular and Mediastinum   Benign essential hypertension     Endocrine   Controlled diabetes mellitus type 2 with complications (HCC) - Primary     Genitourinary   CKD stage G3b/A2, GFR 30-44 and albumin creatinine ratio 30-299 mg/g (HCC)    No follow-ups on file.    [name], MD

## 2023-04-04 NOTE — Assessment & Plan Note (Signed)
Lab Results  Component Value Date   HGBA1C 7.1 (A) 04/04/2023   Improved.  Didn't tolerated the 2mg  dose.

## 2023-04-04 NOTE — Assessment & Plan Note (Signed)
Refer to PT to help with LE strengthening and gait.  Evaluate for vestibula issues.  Also consider spinal stenosis.

## 2023-04-04 NOTE — Progress Notes (Signed)
Established Patient Office Visit  Subjective   Patient ID: Tristan Mayer, male    DOB: 1941/02/01  Age: 82 y.o. MRN: [id number]  Chief Complaint  Patient presents with   Diabetes   Hypertension    HPI  Diabetes - no hypoglycemic events. No wounds or sores that are not healing well. No increased thirst or urination. Checking glucose at home. Taking medications as prescribed without any side effects.  Hypertension- Pt denies chest pain, SOB, dizziness, or heart palpitations.  Taking meds as directed w/o problems.  Denies medication side effects.    F/U CKD 3 -no recent changes.  He did want to discuss his balance.  He still really struggling with his balance.  He says sometimes he just feels like his legs are weak he does still try to go golfing 3 days a week and by the time he gets home he just feels completely exhausted.  He does have neuropathy but does not have a lot of severe pain that seems to come and go.  Occasionally has some low back pain but it is not persistent.  He is never been evaluated for peripheral vascular disease.  Does use a cane occasionally because of his balance.  He denies any room spinning or vertigo.  He also wanted to let me know that his brother recently fell and fractured his hip and his father in his 72s also had a hip fracture.     ROS    Objective:     BP (!) 126/55   Pulse 63   Ht 5\' 10"  (1.778 m)   Wt 214 lb (97.1 kg)   SpO2 98%   BMI 30.71 kg/m    Physical Exam Vitals and nursing note reviewed.  Constitutional:      Appearance: Normal appearance.  HENT:     Head: Normocephalic and atraumatic.  Eyes:     Conjunctiva/sclera: Conjunctivae normal.  Cardiovascular:     Rate and Rhythm: Normal rate and regular rhythm.  Pulmonary:     Effort: Pulmonary effort is normal.     Breath sounds: Normal breath sounds.  Skin:    General: Skin is warm and dry.  Neurological:     Mental Status: He is alert.  Psychiatric:        Mood and  Affect: Mood normal.      Results for orders placed or performed in visit on 04/04/23  POCT HgB A1C  Result Value Ref Range   Hemoglobin A1C 7.1 (A) 4.0 - 5.6 %   HbA1c POC (<> result, manual entry)     HbA1c, POC (prediabetic range)     HbA1c, POC (controlled diabetic range)        The ASCVD Risk score (Arnett DK, et al., 2019) failed to calculate for the following reasons:   The 2019 ASCVD risk score is only valid for ages 37 to 13   The patient has a prior MI or stroke diagnosis    Assessment & Plan:   Problem List Items Addressed This Visit       Endocrine   Hyperlipidemia associated with type 2 diabetes mellitus (HCC)   Controlled diabetes mellitus type 2 with complications (HCC)    Lab Results  Component Value Date   HGBA1C 7.1 (A) 04/04/2023   Improved.  Didn't tolerated the 2mg  dose.        Relevant Medications   Continuous Glucose Sensor (FREESTYLE LIBRE 3 SENSOR) MISC   Other Relevant Orders   POCT HgB A1C (  Completed)     Nervous and Auditory   Idiopathic peripheral neuropathy     Musculoskeletal and Integument   Lumbar spondylosis     Genitourinary   CKD stage G3b/A2, GFR 30-44 and albumin creatinine ratio 30-299 mg/g (HCC)    Due to recheck renal function       Relevant Orders   DG Bone Density     Other   Weakness of both lower extremities    Refer to PT to help with LE strengthening and gait.  Evaluate for vestibula issues.  Also consider spinal stenosis.        Relevant Orders   Ambulatory referral to Physical Therapy   Other Visit Diagnoses     Gait instability    -  Primary   Relevant Orders   Ambulatory referral to Physical Therapy   Intermittent claudication (HCC)       Relevant Orders   VAS [place] ABI WITH/WO TBI   Family history of osteoporosis       Relevant Orders   DG Bone Density   History of fall       Relevant Orders   Ambulatory referral to Physical Therapy       Return in about 3 months (around 07/03/2023) for  Diabetes follow-up.    [name], MD

## 2023-04-04 NOTE — Assessment & Plan Note (Signed)
Due to recheck renal function. 

## 2023-04-07 ENCOUNTER — Ambulatory Visit (HOSPITAL_COMMUNITY): Payer: Medicare Other | Attending: Family Medicine

## 2023-04-07 ENCOUNTER — Ambulatory Visit (HOSPITAL_COMMUNITY)
Admission: RE | Admit: 2023-04-07 | Discharge: 2023-04-07 | Disposition: A | Discharge status: Home / Self Care | Payer: Medicare Other | Source: Ambulatory Visit | Attending: Family Medicine | Admitting: Family Medicine

## 2023-04-07 ENCOUNTER — Telehealth (HOSPITAL_BASED_OUTPATIENT_CLINIC_OR_DEPARTMENT_OTHER): Payer: Self-pay | Admitting: Family Medicine

## 2023-04-07 DIAGNOSIS — I739 Peripheral vascular disease, unspecified: Secondary | ICD-10-CM | POA: Diagnosis not present

## 2023-04-07 LAB — VAS US ABI WITH/WO TBI

## 2023-04-11 ENCOUNTER — Encounter: Payer: Self-pay | Admitting: Physical Therapy

## 2023-04-11 ENCOUNTER — Other Ambulatory Visit: Payer: Self-pay

## 2023-04-11 ENCOUNTER — Ambulatory Visit: Payer: Medicare Other | Attending: Family Medicine | Admitting: Physical Therapy

## 2023-04-11 DIAGNOSIS — Z5189 Encounter for other specified aftercare: Secondary | ICD-10-CM | POA: Diagnosis not present

## 2023-04-11 DIAGNOSIS — R2689 Other abnormalities of gait and mobility: Secondary | ICD-10-CM | POA: Insufficient documentation

## 2023-04-11 DIAGNOSIS — M6281 Muscle weakness (generalized): Secondary | ICD-10-CM | POA: Insufficient documentation

## 2023-04-11 DIAGNOSIS — Z9189 Other specified personal risk factors, not elsewhere classified: Secondary | ICD-10-CM | POA: Diagnosis not present

## 2023-04-11 DIAGNOSIS — Z9181 History of falling: Secondary | ICD-10-CM | POA: Diagnosis present

## 2023-04-11 DIAGNOSIS — R269 Unspecified abnormalities of gait and mobility: Secondary | ICD-10-CM | POA: Diagnosis not present

## 2023-04-11 NOTE — Therapy (Signed)
OUTPATIENT PHYSICAL THERAPY NEURO EVALUATION   Patient Name: Tristan Mayer MRN: [id number] DOB:31-Jan-1941, 82 y.o., male Today's Date: 04/11/2023   PCP: Linford Arnold REFERRING PROVIDER: Linford Arnold  END OF SESSION:  PT End of Session - 04/11/23 1258     Visit Number 1    Number of Visits 16    Date for PT Re-Evaluation 06/06/23    Authorization Type [employer] medicare    Progress Note Due on Visit 10    PT Start Time 1100    PT Stop Time 1140    PT Time Calculation (min) 40 min    Activity Tolerance Patient tolerated treatment well    Behavior During Therapy WFL for tasks assessed/performed             Past Medical History:  Diagnosis Date   Atrial fibrillation (HCC)    CVA (cerebral vascular accident) (HCC)    Diabetes mellitus (HCC)    GERD (gastroesophageal reflux disease)    Hyperlipidemia    PUD (peptic ulcer disease)    Past Surgical History:  Procedure Laterality Date   CATARACT EXTRACTION W/ INTRAOCULAR LENS IMPLANT Right    Patient Active Problem List   Diagnosis Date Noted   Weakness of both lower extremities 04/04/2023   CKD stage G3b/A2, GFR 30-44 and albumin creatinine ratio 30-299 mg/g (HCC) 01/03/2023   Primary osteoarthritis of right hand 07/18/2022   Lumbar spondylosis 05/09/2022   Primary osteoarthritis of left hip 04/13/2022   AV block, 1st degree 07/05/2021   Idiopathic peripheral neuropathy 09/16/2019   BMI 31.0-31.9,adult 03/26/2019   Obesity, Class I, BMI 30.0-34.9 (see actual BMI) 08/22/2018   Aortic atherosclerosis (HCC) 03/12/2018   Primary osteoarthritis of both knees 01/03/2018   BPH (benign prostatic hyperplasia) 07/10/2017   INO (internuclear ophthalmoplegia), left 07/03/2017   Hearing loss 07/03/2017   ED (erectile dysfunction) 07/03/2017   Paroxysmal A-fib (HCC) 12/12/2016   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) 12/09/2016   Thrombsis of left atrial appendage without antecedent  myocardial infarction 12/09/2016   Controlled diabetes mellitus type 2 with complications (HCC) 08/14/2008   Hyperlipidemia associated with type 2 diabetes mellitus (HCC) 05/20/2008   Sleep apnea 07/19/2007   Allergic rhinitis 01/07/2007   Benign essential hypertension 01/07/2007   Gout, unspecified 01/07/2007    ONSET DATE: 04/2023 (MD appt)  REFERRING DIAG: falls, gait instability  THERAPY DIAG:  Muscle weakness (generalized)  Balance disorder  Rationale for Evaluation and Treatment: Rehabilitation  SUBJECTIVE:  SUBJECTIVE STATEMENT: Pt states that he starts every morning feeling stiff from his knees to his low back. He states when he wakes up he feels "like I've been beat with a stick". He also states "I wobble all the time". MD suspects balance deficits are due to LE weakness. Pt states after he plays golf he hurts so bad he can barely walk from his truck back into the house. He states he walks his dog 5 minutes at a time to improve endurance. He states he usually falls when bending forward to put his golf tee in the ground or when looking down Pt accompanied by: significant other  PERTINENT HISTORY: CVA 2016  PAIN:  Are you having pain? Yes: NPRS scale: 8/10 Pain location: both knees and hips Pain description: sore, achey Aggravating factors: standing, walking, golf Relieving factors: meds, heat  PRECAUTIONS: Fall  RED FLAGS: None   WEIGHT BEARING RESTRICTIONS: No  FALLS: Has patient fallen in last 6 months? Yes. Number of falls multiple. "I fell this morning getting ready"  LIVING ENVIRONMENT: Lives with: lives with their family Lives in: House/apartment Stairs: No Has following equipment at home: Single point cane and Walker - 2 wheeled  PLOF: Independent  PATIENT GOALS: not  be stiff in the morning, less pain, improve balance  OBJECTIVE:  Note: Objective measures were completed at Evaluation unless otherwise noted.    COGNITION: Overall cognitive status: Within functional limits for tasks assessed   SENSATION: Light touch: Impaired    LOWER EXTREMITY MMT:    MMT Right Eval Left Eval  Hip flexion 4- 4-  Hip extension    Hip abduction 3+ 3+  Hip adduction    Hip internal rotation    Hip external rotation    Knee flexion 4- 4  Knee extension 4 4  Ankle dorsiflexion    Ankle plantarflexion    Ankle inversion    Ankle eversion    (Blank rows = not tested)   GAIT: Gait pattern: decreased step length- Right, decreased step length- Left, and wide BOS Distance walked: 100' Assistive device utilized: None Level of assistance: Complete Independence Comments: wide BOS, drifts Rt  FUNCTIONAL TESTS:  5 times sit to stand: 20.41 seconds Timed up and go (TUG): 16.15 seconds Dynamic Gait Index: 8/24   TODAY'S TREATMENT:                                                                                                                              DATE: review results of DGI, TUG and 5 x STS Review initial HEP    PATIENT EDUCATION: Education details: PT POC and goals, HEP Person educated: Patient Education method: Explanation, Demonstration, and Handouts Education comprehension: verbalized understanding and returned demonstration  HOME EXERCISE PROGRAM: Access Code: [email or web address] URL: [email or web address] Date: 04/11/2023 Prepared by: Reggy Eye  Exercises - Standing Hip Abduction with Counter Support  - 1 x daily - 7 x weekly - 3 sets - 10 reps -  Standing Knee Flexion AROM with Chair Support  - 1 x daily - 7 x weekly - 3 sets - 10 reps - Heel Toe Raises with Counter Support  - 1 x daily - 7 x weekly - 3 sets - 10 reps - Sit to Stand  - 1 x daily - 7 x weekly - 3 sets - 5 reps  GOALS: Goals reviewed with patient?  Yes  SHORT TERM GOALS: Target date: 05/09/2023    Pt will be independent with initial HEP Baseline: Goal status: INITIAL  2.  Pt will demo improved LE strength with 5 x STS <= 17 seconds Baseline:  Goal status: INITIAL    LONG TERM GOALS: Target date: 06/06/2023    Pt will be independent with advanced HEP Baseline:  Goal status: INITIAL  2.  Pt will demo decreased fall risk with DGI >= 16/24 Baseline:  Goal status: INITIAL  3.  Pt will demo improved gait and balance with TUG <= 14 seconds Baseline:  Goal status: INITIAL  4.  Pt will report being able to put golf tee in the ground without falling on 10/10 trials Baseline:  Goal status: INITIAL  5.  Pt will report 50% reduction in "stiffness" when waking up in the morning Baseline:  Goal status: INITIAL   ASSESSMENT:  CLINICAL IMPRESSION: Patient is a 82 y.o. male who was seen today for physical therapy evaluation and treatment for gait instability and falls. He presents with decreased strength, increased fall risk, impaired balance and gait and will benefit from skilled PT to address deficits and improve functional mobility and reduce risk and frequency of falls.   OBJECTIVE IMPAIRMENTS: Abnormal gait, decreased activity tolerance, decreased balance, and decreased strength.   ACTIVITY LIMITATIONS: standing, transfers, and locomotion level  PARTICIPATION LIMITATIONS: community activity and yard work  PERSONAL FACTORS: Time since onset of injury/illness/exacerbation and 1 comorbidity: CVA  are also affecting patient's functional outcome.   REHAB POTENTIAL: Good  CLINICAL DECISION MAKING: Evolving/moderate complexity  EVALUATION COMPLEXITY: Moderate  PLAN:  PT FREQUENCY: 2x/week  PT DURATION: 8 weeks  PLANNED INTERVENTIONS: 97164- PT Re-evaluation, 97110-Therapeutic exercises, 97530- Therapeutic activity, [id number]- Neuromuscular re-education, 97535- Self Care, 16109- Manual therapy, [id number]- Aquatic Therapy,  97014- Electrical stimulation (unattended), 97033- Ionotophoresis 4mg /ml Dexamethasone, Patient/Family education, Balance training, Stair training, Taping, Dry Needling, Cryotherapy, and Moist heat  PLAN FOR NEXT SESSION: assess response to HEP. Static and dynamic balance. Add balance to HEP  Referring diagnosis? Falls, gait instability Treatment diagnosis? (if different than referring diagnosis) generalized muscle weakness, balance disorder What was this (referring dx) caused by? []  Surgery [x]  Fall [x]  Ongoing issue []  Arthritis []  Other: ____________  Laterality: []  Rt []  Lt [x]  Both  Check all possible CPT codes:  *CHOOSE 10 OR LESS*    See Planned Interventions listed in the Plan section of the Evaluation.   Lauren Aguayo, PT 04/11/2023, 12:59 PM

## 2023-04-12 NOTE — Progress Notes (Signed)
Hi Craig, the blood pressure sure is on your legs show noncompressible or hardened arteries in your lower legs.  I like to get you in with a vascular specialist so they can do some more detailed evaluation.  If you are okay with this then please let us know.

## 2023-04-13 DIAGNOSIS — G894 Chronic pain syndrome: Secondary | ICD-10-CM | POA: Diagnosis not present

## 2023-04-13 DIAGNOSIS — M170 Bilateral primary osteoarthritis of knee: Secondary | ICD-10-CM | POA: Diagnosis not present

## 2023-04-18 ENCOUNTER — Ambulatory Visit: Payer: Medicare Other | Admitting: Physical Therapy

## 2023-04-19 ENCOUNTER — Other Ambulatory Visit: Payer: Self-pay | Admitting: Family Medicine

## 2023-04-19 ENCOUNTER — Ambulatory Visit: Payer: Medicare Other

## 2023-04-19 DIAGNOSIS — Z794 Long term (current) use of insulin: Secondary | ICD-10-CM

## 2023-04-19 DIAGNOSIS — M810 Age-related osteoporosis without current pathological fracture: Secondary | ICD-10-CM | POA: Diagnosis not present

## 2023-04-19 DIAGNOSIS — N1832 Chronic kidney disease, stage 3b: Secondary | ICD-10-CM

## 2023-04-19 DIAGNOSIS — Z8262 Family history of osteoporosis: Secondary | ICD-10-CM

## 2023-04-20 ENCOUNTER — Other Ambulatory Visit: Payer: Self-pay | Admitting: Family Medicine

## 2023-04-20 ENCOUNTER — Ambulatory Visit: Payer: Medicare Other

## 2023-04-20 DIAGNOSIS — I739 Peripheral vascular disease, unspecified: Secondary | ICD-10-CM

## 2023-04-20 DIAGNOSIS — R2689 Other abnormalities of gait and mobility: Secondary | ICD-10-CM | POA: Diagnosis not present

## 2023-04-20 DIAGNOSIS — R269 Unspecified abnormalities of gait and mobility: Secondary | ICD-10-CM | POA: Diagnosis not present

## 2023-04-20 DIAGNOSIS — M6281 Muscle weakness (generalized): Secondary | ICD-10-CM | POA: Diagnosis not present

## 2023-04-20 DIAGNOSIS — Z9189 Other specified personal risk factors, not elsewhere classified: Secondary | ICD-10-CM | POA: Diagnosis not present

## 2023-04-20 DIAGNOSIS — Z5189 Encounter for other specified aftercare: Secondary | ICD-10-CM | POA: Diagnosis not present

## 2023-04-20 NOTE — Progress Notes (Signed)
Hi Craig, your bone density shows a T-score of -1.5 meaning that you have mildly thin bones.  Also known as osteopenia.   The current recommendation for osteopenia (mildly thin bones) treatment includes:   #1 calcium-total of 1200 mg of calcium daily.  If you eat a very calcium rich diet you may be able to obtain that without a supplement.  If not, then I recommend calcium 500 mg twice a day.  There are several products over-the-counter such as Caltrate D and Viactiv chews which are great options that contain calcium and vitamin D. #2 vitamin D-recommend 800 international units daily. #3 exercise-recommend 30 minutes of weightbearing exercise 3 days a week.  Resistance training ,such as doing bands and light weights, can be particularly helpful.

## 2023-04-20 NOTE — Progress Notes (Signed)
Order placed for referral.  

## 2023-04-20 NOTE — Progress Notes (Signed)
Orders Placed This Encounter  Procedures  . Ambulatory referral to Vascular Surgery    Referral Priority:   Routine    Referral Type:   Surgical    Referral Reason:   Specialty Services Required    Requested Specialty:   Vascular Surgery    Number of Visits Requested:   1

## 2023-04-20 NOTE — Therapy (Signed)
OUTPATIENT PHYSICAL THERAPY NEURO TREATMENT NOTE   Patient Name: Tristan Mayer MRN: [id number] DOB:May 16, 1940, 82 y.o., male Today's Date: 04/20/2023   PCP: Linford Arnold REFERRING PROVIDER: Linford Arnold  END OF SESSION:  PT End of Session - 04/20/23 1306     Visit Number 2    Number of Visits 16    Date for PT Re-Evaluation 06/06/23    Authorization Type [employer] medicare    Progress Note Due on Visit 10    PT Start Time 1018    PT Stop Time 1100    PT Time Calculation (min) 42 min    Activity Tolerance Patient tolerated treatment well    Behavior During Therapy WFL for tasks assessed/performed             Past Medical History:  Diagnosis Date   Atrial fibrillation (HCC)    CVA (cerebral vascular accident) (HCC)    Diabetes mellitus (HCC)    GERD (gastroesophageal reflux disease)    Hyperlipidemia    PUD (peptic ulcer disease)    Past Surgical History:  Procedure Laterality Date   CATARACT EXTRACTION W/ INTRAOCULAR LENS IMPLANT Right    Patient Active Problem List   Diagnosis Date Noted   Weakness of both lower extremities 04/04/2023   CKD stage G3b/A2, GFR 30-44 and albumin creatinine ratio 30-299 mg/g (HCC) 01/03/2023   Primary osteoarthritis of right hand 07/18/2022   Lumbar spondylosis 05/09/2022   Primary osteoarthritis of left hip 04/13/2022   AV block, 1st degree 07/05/2021   Idiopathic peripheral neuropathy 09/16/2019   BMI 31.0-31.9,adult 03/26/2019   Obesity, Class I, BMI 30.0-34.9 (see actual BMI) 08/22/2018   Aortic atherosclerosis (HCC) 03/12/2018   Primary osteoarthritis of both knees 01/03/2018   BPH (benign prostatic hyperplasia) 07/10/2017   INO (internuclear ophthalmoplegia), left 07/03/2017   Hearing loss 07/03/2017   ED (erectile dysfunction) 07/03/2017   Paroxysmal A-fib (HCC) 12/12/2016   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) 12/09/2016   Thrombsis of left atrial appendage without antecedent  myocardial infarction 12/09/2016   Controlled diabetes mellitus type 2 with complications (HCC) 08/14/2008   Hyperlipidemia associated with type 2 diabetes mellitus (HCC) 05/20/2008   Sleep apnea 07/19/2007   Allergic rhinitis 01/07/2007   Benign essential hypertension 01/07/2007   Gout, unspecified 01/07/2007    ONSET DATE: 04/2023 (MD appt)  REFERRING DIAG: falls, gait instability  THERAPY DIAG:  Muscle weakness (generalized)  Balance disorder  Rationale for Evaluation and Treatment: Rehabilitation  SUBJECTIVE:  SUBJECTIVE STATEMENT: Patient returned to the clinic and reviewed chief complaints. He noted that when he goes golfing, walking downhill is another problem for him where he feels like he is going to fall forward.  From eval: Pt states that he starts every morning feeling stiff from his knees to his low back. He states when he wakes up he feels "like I've been beat with a stick". He also states "I wobble all the time". MD suspects balance deficits are due to LE weakness. Pt states after he plays golf he hurts so bad he can barely walk from his truck back into the house. He states he walks his dog 5 minutes at a time to improve endurance. He states he usually falls when bending forward to put his golf tee in the ground or when looking down Pt accompanied by: significant other  PERTINENT HISTORY: CVA 2016  PAIN:  Are you having pain? Yes: NPRS scale: 8/10 Pain location: both knees and hips Pain description: sore, achey Aggravating factors: standing, walking, golf Relieving factors: meds, heat  PRECAUTIONS: Fall  RED FLAGS: None   WEIGHT BEARING RESTRICTIONS: No  FALLS: Has patient fallen in last 6 months? Yes. Number of falls multiple. "I fell this morning getting ready"  LIVING  ENVIRONMENT: Lives with: lives with their family Lives in: House/apartment Stairs: No Has following equipment at home: Single point cane and Walker - 2 wheeled  PLOF: Independent  PATIENT GOALS: not be stiff in the morning, less pain, improve balance  OBJECTIVE:  Note: Objective measures were completed at Evaluation unless otherwise noted.  COGNITION: Overall cognitive status: Within functional limits for tasks assessed   SENSATION: Light touch: Impaired    LOWER EXTREMITY MMT:    MMT Right Eval Left Eval  Hip flexion 4- 4-  Hip extension    Hip abduction 3+ 3+  Hip adduction    Hip internal rotation    Hip external rotation    Knee flexion 4- 4  Knee extension 4 4  Ankle dorsiflexion    Ankle plantarflexion    Ankle inversion    Ankle eversion    (Blank rows = not tested)   GAIT: Gait pattern: decreased step length- Right, decreased step length- Left, and wide BOS Distance walked: 100' Assistive device utilized: None Level of assistance: Complete Independence Comments: wide BOS, drifts Rt  FUNCTIONAL TESTS:  5 times sit to stand: 20.41 seconds Timed up and go (TUG): 16.15 seconds Dynamic Gait Index: 8/24  [facility] Adult PT Treatment:                                                DATE: 04/20/2023 Manual Therapy: Sidelying: General PA glides throughout spine and rib cage Therapeutic Exercise: Supine: Hip PROM, FABER and FADIR working in varying amounts of hip flexion Hamstring stretch, 90/90 position Seated: Fitter Board knee flexion, light band, 2 x 20 reps Fitter Board knee extension, light band, 2 x 20 reps Standing: Back against wall slider disc forward reach, x 10 per side Retro manual walking on treadmill, x 30 Cable column backward walk outs, 25 lbs, x 5 rounds, slow/controlled return Neuromuscular re-ed: Standing: Toe touches, toes elevated on wedge, x 10, contact guard assist Toe touches, heels elevated on wedge, x 10, contact guard  assist  TODAY'S TREATMENT:  DATE: review results of DGI, TUG and 5 x STS Review initial HEP    PATIENT EDUCATION: Education details: PT POC and goals, HEP Person educated: Patient Education method: Explanation, Demonstration, and Handouts Education comprehension: verbalized understanding and returned demonstration  HOME EXERCISE PROGRAM: Access Code: [email or web address] URL: [email or web address] Date: 04/11/2023 Prepared by: Reggy Eye  Exercises - Standing Hip Abduction with Counter Support  - 1 x daily - 7 x weekly - 3 sets - 10 reps - Standing Knee Flexion AROM with Chair Support  - 1 x daily - 7 x weekly - 3 sets - 10 reps - Heel Toe Raises with Counter Support  - 1 x daily - 7 x weekly - 3 sets - 10 reps - Sit to Stand  - 1 x daily - 7 x weekly - 3 sets - 5 reps  GOALS: Goals reviewed with patient? Yes  SHORT TERM GOALS: Target date: 05/09/2023  Pt will be independent with initial HEP Baseline: Goal status: INITIAL  2.  Pt will demo improved LE strength with 5 x STS <= 17 seconds Baseline:  Goal status: INITIAL  LONG TERM GOALS: Target date: 06/06/2023  Pt will be independent with advanced HEP Baseline:  Goal status: INITIAL  2.  Pt will demo decreased fall risk with DGI >= 16/24 Baseline:  Goal status: INITIAL  3.  Pt will demo improved gait and balance with TUG <= 14 seconds Baseline:  Goal status: INITIAL  4.  Pt will report being able to put golf tee in the ground without falling on 10/10 trials Baseline:  Goal status: INITIAL  5.  Pt will report 50% reduction in "stiffness" when waking up in the morning Baseline:  Goal status: INITIAL   ASSESSMENT:  CLINICAL IMPRESSION: Continued working on lower extremity strength, particularly quadriceps eccentric control and posterior chain to address unsteadiness bending forward  and with walking downhill. Some R anterior knee pain was noted with squatting-type tasks. Patient continues to be unsteady and requires guard with more advanced tasks.  From eval: Patient is a 82 y.o. male who was seen today for physical therapy evaluation and treatment for gait instability and falls. He presents with decreased strength, increased fall risk, impaired balance and gait and will benefit from skilled PT to address deficits and improve functional mobility and reduce risk and frequency of falls.   OBJECTIVE IMPAIRMENTS: Abnormal gait, decreased activity tolerance, decreased balance, and decreased strength.   ACTIVITY LIMITATIONS: standing, transfers, and locomotion level  PARTICIPATION LIMITATIONS: community activity and yard work  PERSONAL FACTORS: Time since onset of injury/illness/exacerbation and 1 comorbidity: CVA  are also affecting patient's functional outcome.   REHAB POTENTIAL: Good  CLINICAL DECISION MAKING: Evolving/moderate complexity  EVALUATION COMPLEXITY: Moderate  PLAN:  PT FREQUENCY: 2x/week  PT DURATION: 8 weeks  PLANNED INTERVENTIONS: 97164- PT Re-evaluation, 97110-Therapeutic exercises, 97530- Therapeutic activity, [id number]- Neuromuscular re-education, 97535- Self Care, 19147- Manual therapy, [id number]- Aquatic Therapy, 97014- Electrical stimulation (unattended), 97033- Ionotophoresis 4mg /ml Dexamethasone, Patient/Family education, Balance training, Stair training, Taping, Dry Needling, Cryotherapy, and Moist heat  PLAN FOR NEXT SESSION: assess response to HEP. Static and dynamic balance. Add balance to HEP  Referring diagnosis? Falls, gait instability Treatment diagnosis? (if different than referring diagnosis) generalized muscle weakness, balance disorder What was this (referring dx) caused by? []  Surgery [x]  Fall [x]  Ongoing issue []  Arthritis []  Other: ____________  Laterality: []  Rt []  Lt [x]  Both  Check all possible CPT codes:  *CHOOSE 10 OR  LESS*    See  Planned Interventions listed in the Plan section of the Evaluation.   Clelia Schaumann, PT 04/20/2023, 3:27 PM

## 2023-04-27 ENCOUNTER — Ambulatory Visit: Payer: Medicare Other

## 2023-04-27 DIAGNOSIS — Z9189 Other specified personal risk factors, not elsewhere classified: Secondary | ICD-10-CM | POA: Diagnosis not present

## 2023-04-27 DIAGNOSIS — M6281 Muscle weakness (generalized): Secondary | ICD-10-CM

## 2023-04-27 DIAGNOSIS — Z5189 Encounter for other specified aftercare: Secondary | ICD-10-CM | POA: Diagnosis not present

## 2023-04-27 DIAGNOSIS — R2689 Other abnormalities of gait and mobility: Secondary | ICD-10-CM | POA: Diagnosis not present

## 2023-04-27 DIAGNOSIS — R269 Unspecified abnormalities of gait and mobility: Secondary | ICD-10-CM | POA: Diagnosis not present

## 2023-04-27 NOTE — Therapy (Addendum)
OUTPATIENT PHYSICAL THERAPY NEURO TREATMENT NOTE   Patient Name: Tristan Mayer MRN: [id number] DOB:[birth year], 82 y.o., male Today's Date: 04/27/2023   PCP: Linford Arnold REFERRING PROVIDER: Linford Arnold  END OF SESSION:  PT End of Session - 04/27/23 1547     Visit Number 3    Number of Visits 16    Date for PT Re-Evaluation 06/06/23    Authorization Type [employer] medicare    Progress Note Due on Visit 10    PT Start Time 1400    PT Stop Time 1440    PT Time Calculation (min) 40 min    Equipment Utilized During Treatment Gait belt    Activity Tolerance Patient tolerated treatment well    Behavior During Therapy WFL for tasks assessed/performed              Past Medical History:  Diagnosis Date   Atrial fibrillation (HCC)    CVA (cerebral vascular accident) (HCC)    Diabetes mellitus (HCC)    GERD (gastroesophageal reflux disease)    Hyperlipidemia    PUD (peptic ulcer disease)    Past Surgical History:  Procedure Laterality Date   CATARACT EXTRACTION W/ INTRAOCULAR LENS IMPLANT Right    Patient Active Problem List   Diagnosis Date Noted   Weakness of both lower extremities 04/04/2023   CKD stage G3b/A2, GFR 30-44 and albumin creatinine ratio 30-299 mg/g (HCC) 01/03/2023   Primary osteoarthritis of right hand 07/18/2022   Lumbar spondylosis 05/09/2022   Primary osteoarthritis of left hip 04/13/2022   AV block, 1st degree 07/05/2021   Idiopathic peripheral neuropathy 09/16/2019   BMI 31.0-31.9,adult 03/26/2019   Obesity, Class I, BMI 30.0-34.9 (see actual BMI) 08/22/2018   Aortic atherosclerosis (HCC) 03/12/2018   Primary osteoarthritis of both knees 01/03/2018   BPH (benign prostatic hyperplasia) 07/10/2017   INO (internuclear ophthalmoplegia), left 07/03/2017   Hearing loss 07/03/2017   ED (erectile dysfunction) 07/03/2017   Paroxysmal A-fib (HCC) 12/12/2016   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) 12/09/2016    Thrombsis of left atrial appendage without antecedent myocardial infarction 12/09/2016   Controlled diabetes mellitus type 2 with complications (HCC) 08/14/2008   Hyperlipidemia associated with type 2 diabetes mellitus (HCC) 05/20/2008   Sleep apnea 07/19/2007   Allergic rhinitis 01/07/2007   Benign essential hypertension 01/07/2007   Gout, unspecified 01/07/2007    ONSET DATE: 04/2023 (MD appt)  REFERRING DIAG: falls, gait instability  THERAPY DIAG:  Muscle weakness (generalized)  Balance disorder  Rationale for Evaluation and Treatment: Rehabilitation  SUBJECTIVE:  SUBJECTIVE STATEMENT: Patient returned to the clinic stated that he did not experience any lingering soreness after last session. He has his usual pains in his hips and from the knees to his feet, but this is normal for him. Overall, he continues to experience balance difficulties, but does feel he is walking better even with just the couple of physical therapy sessions and his exercises at home.  From eval: Pt states that he starts every morning feeling stiff from his knees to his low back. He states when he wakes up he feels "like I've been beat with a stick". He also states "I wobble all the time". MD suspects balance deficits are due to LE weakness. Pt states after he plays golf he hurts so bad he can barely walk from his truck back into the house. He states he walks his dog 5 minutes at a time to improve endurance. He states he usually falls when bending forward to put his golf tee in the ground or when looking down Pt accompanied by: significant other  PERTINENT HISTORY: CVA 2016  PAIN:  Are you having pain? Yes: NPRS scale: 8/10 Pain location: both knees and hips Pain description: sore, achey Aggravating factors: standing,  walking, golf Relieving factors: meds, heat  PRECAUTIONS: Fall  RED FLAGS: None   WEIGHT BEARING RESTRICTIONS: No  FALLS: Has patient fallen in last 6 months? Yes. Number of falls multiple. "I fell this morning getting ready"  LIVING ENVIRONMENT: Lives with: lives with their family Lives in: House/apartment Stairs: No Has following equipment at home: Single point cane and Walker - 2 wheeled  PLOF: Independent  PATIENT GOALS: not be stiff in the morning, less pain, improve balance  OBJECTIVE:  Note: Objective measures were completed at Evaluation unless otherwise noted.  COGNITION: Overall cognitive status: Within functional limits for tasks assessed   SENSATION: Light touch: Impaired    LOWER EXTREMITY MMT:    MMT Right Eval Left Eval  Hip flexion 4- 4-  Hip extension    Hip abduction 3+ 3+  Hip adduction    Hip internal rotation    Hip external rotation    Knee flexion 4- 4  Knee extension 4 4  Ankle dorsiflexion    Ankle plantarflexion    Ankle inversion    Ankle eversion    (Blank rows = not tested)   GAIT: Gait pattern: decreased step length- Right, decreased step length- Left, and wide BOS Distance walked: 100' Assistive device utilized: None Level of assistance: Complete Independence Comments: wide BOS, drifts Rt  FUNCTIONAL TESTS:  5 times sit to stand: 20.41 seconds Timed up and go (TUG): 16.15 seconds Dynamic Gait Index: 8/24  [facility] Adult PT Treatment:                                                DATE: 04/27/2023 Therapeutic Exercise: Supine: Bent knee fallouts, R and L, x 10 each Hamstring stretch, 90/90 position, x 3 min per side Neuromuscular re-ed: Standing: Airex beam side steps, R and L, x 3 each Airex beam forward/backward weight shifts, x 10 each Narrow width forward (5) and backward (5) steps, x 3 sets Forward/backward weight shifts, 3 x 30 4" step toe taps R and L, 2 x 20 Band assisted forward/backward weight shifts, x 10  each  [facility] Adult PT Treatment:  DATE: 04/20/2023 Manual Therapy: Sidelying: General PA glides throughout spine and rib cage Therapeutic Exercise: Supine: Hip PROM, FABER and FADIR working in varying amounts of hip flexion Hamstring stretch, 90/90 position Seated: Fitter Board knee flexion, light band, 2 x 20 reps Fitter Board knee extension, light band, 2 x 20 reps Standing: Back against wall slider disc forward reach, x 10 per side Retro manual walking on treadmill, x 30 Cable column backward walk outs, 25 lbs, x 5 rounds, slow/controlled return Neuromuscular re-ed: Standing: Toe touches, toes elevated on wedge, x 10, contact guard assist Toe touches, heels elevated on wedge, x 10, contact guard assist  TODAY'S TREATMENT:                                                                                                                              DATE: review results of DGI, TUG and 5 x STS Review initial HEP    PATIENT EDUCATION: Education details: PT POC and goals, HEP Person educated: Patient Education method: Explanation, Demonstration, and Handouts Education comprehension: verbalized understanding and returned demonstration  HOME EXERCISE PROGRAM: Access Code: [email or web address] URL: [email or web address] Date: 04/27/2023 Prepared by: Edmonia Caprio  Exercises - Standing Hip Abduction with Counter Support  - 1 x daily - 7 x weekly - 3 sets - 10 reps - Standing Knee Flexion AROM with Chair Support  - 1 x daily - 7 x weekly - 3 sets - 10 reps - Heel Toe Raises with Counter Support  - 1 x daily - 7 x weekly - 3 sets - 10 reps - Sit to Stand  - 1 x daily - 7 x weekly - 3 sets - 5 reps - Forward Backward Weight Shift with Counter Support  - 1 x daily - 7 x weekly - 3 sets - 10 reps  GOALS: Goals reviewed with patient? Yes  SHORT TERM GOALS: Target date: 05/09/2023  Pt will be independent with initial HEP Baseline: Goal  status: INITIAL  2.  Pt will demo improved LE strength with 5 x STS <= 17 seconds Baseline:  Goal status: INITIAL  LONG TERM GOALS: Target date: 06/06/2023  Pt will be independent with advanced HEP Baseline:  Goal status: INITIAL  2.  Pt will demo decreased fall risk with DGI >= 16/24 Baseline:  Goal status: INITIAL  3.  Pt will demo improved gait and balance with TUG <= 14 seconds Baseline:  Goal status: INITIAL  4.  Pt will report being able to put golf tee in the ground without falling on 10/10 trials Baseline:  Goal status: INITIAL  5.  Pt will report 50% reduction in "stiffness" when waking up in the morning Baseline:  Goal status: INITIAL   ASSESSMENT:  CLINICAL IMPRESSION: Focused more on balance today. Patient was contact guard to min assist with all exercises as all exercises challenged patient to the point of loss of balance. Patient enjoyed the forward/backward weight shifting exercises and felt they addressed  what he needs to work on - added this exercise to home program but emphasized safety in set up and performance to prevent falls. Physical therapy remains indicated.  From eval: Patient is a 82 y.o. male who was seen today for physical therapy evaluation and treatment for gait instability and falls. He presents with decreased strength, increased fall risk, impaired balance and gait and will benefit from skilled PT to address deficits and improve functional mobility and reduce risk and frequency of falls.   OBJECTIVE IMPAIRMENTS: Abnormal gait, decreased activity tolerance, decreased balance, and decreased strength.   ACTIVITY LIMITATIONS: standing, transfers, and locomotion level  PARTICIPATION LIMITATIONS: community activity and yard work  PERSONAL FACTORS: Time since onset of injury/illness/exacerbation and 1 comorbidity: CVA  are also affecting patient's functional outcome.   REHAB POTENTIAL: Good  CLINICAL DECISION MAKING: Evolving/moderate  complexity  EVALUATION COMPLEXITY: Moderate  PLAN:  PT FREQUENCY: 2x/week  PT DURATION: 8 weeks  PLANNED INTERVENTIONS: 97164- PT Re-evaluation, 97110-Therapeutic exercises, 97530- Therapeutic activity, [id number]- Neuromuscular re-education, 97535- Self Care, 78469- Manual therapy, [id number]- Aquatic Therapy, 97014- Electrical stimulation (unattended), 97033- Ionotophoresis 4mg /ml Dexamethasone, Patient/Family education, Balance training, Stair training, Taping, Dry Needling, Cryotherapy, and Moist heat  PLAN FOR NEXT SESSION: assess response to HEP. Static and dynamic balance. Add balance to HEP  Referring diagnosis? Falls, gait instability Treatment diagnosis? (if different than referring diagnosis) generalized muscle weakness, balance disorder What was this (referring dx) caused by? []  Surgery [x]  Fall [x]  Ongoing issue []  Arthritis []  Other: ____________  Laterality: []  Rt []  Lt [x]  Both  Check all possible CPT codes:  *CHOOSE 10 OR LESS*    See Planned Interventions listed in the Plan section of the Evaluation.   Clelia Schaumann, PT 04/27/2023, 3:58 PM

## 2023-05-02 ENCOUNTER — Ambulatory Visit: Payer: Medicare Other

## 2023-05-02 DIAGNOSIS — M6281 Muscle weakness (generalized): Secondary | ICD-10-CM

## 2023-05-02 DIAGNOSIS — R269 Unspecified abnormalities of gait and mobility: Secondary | ICD-10-CM | POA: Diagnosis not present

## 2023-05-02 DIAGNOSIS — Z9189 Other specified personal risk factors, not elsewhere classified: Secondary | ICD-10-CM | POA: Diagnosis not present

## 2023-05-02 DIAGNOSIS — R2689 Other abnormalities of gait and mobility: Secondary | ICD-10-CM

## 2023-05-02 DIAGNOSIS — Z5189 Encounter for other specified aftercare: Secondary | ICD-10-CM | POA: Diagnosis not present

## 2023-05-02 NOTE — Therapy (Signed)
 OUTPATIENT PHYSICAL THERAPY NEURO TREATMENT NOTE   Patient Name: Tristan Mayer MRN: [id number] DOB:1941-04-28, 82 y.o., male Today's Date: 05/02/2023   PCP: Alvan REFERRING PROVIDER: Alvan  END OF SESSION:  PT End of Session - 05/02/23 1017     Visit Number 4    Number of Visits 16    Date for PT Re-Evaluation 06/06/23    Authorization Type [employer] medicare    Progress Note Due on Visit 10    PT Start Time 1018    PT Stop Time 1105    PT Time Calculation (min) 47 min    Activity Tolerance Patient tolerated treatment well    Behavior During Therapy WFL for tasks assessed/performed            Past Medical History:  Diagnosis Date   Atrial fibrillation (HCC)    CVA (cerebral vascular accident) (HCC)    Diabetes mellitus (HCC)    GERD (gastroesophageal reflux disease)    Hyperlipidemia    PUD (peptic ulcer disease)    Past Surgical History:  Procedure Laterality Date   CATARACT EXTRACTION W/ INTRAOCULAR LENS IMPLANT Right    Patient Active Problem List   Diagnosis Date Noted   Weakness of both lower extremities 04/04/2023   CKD stage G3b/A2, GFR 30-44 and albumin creatinine ratio 30-299 mg/g (HCC) 01/03/2023   Primary osteoarthritis of right hand 07/18/2022   Lumbar spondylosis 05/09/2022   Primary osteoarthritis of left hip 04/13/2022   AV block, 1st degree 07/05/2021   Idiopathic peripheral neuropathy 09/16/2019   BMI 31.0-31.9,adult 03/26/2019   Obesity, Class I, BMI 30.0-34.9 (see actual BMI) 08/22/2018   Aortic atherosclerosis (HCC) 03/12/2018   Primary osteoarthritis of both knees 01/03/2018   BPH (benign prostatic hyperplasia) 07/10/2017   INO (internuclear ophthalmoplegia), left 07/03/2017   Hearing loss 07/03/2017   ED (erectile dysfunction) 07/03/2017   Paroxysmal A-fib (HCC) 12/12/2016   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) 12/09/2016   Thrombsis of left atrial appendage without antecedent  myocardial infarction 12/09/2016   Controlled diabetes mellitus type 2 with complications (HCC) 08/14/2008   Hyperlipidemia associated with type 2 diabetes mellitus (HCC) 05/20/2008   Sleep apnea 07/19/2007   Allergic rhinitis 01/07/2007   Benign essential hypertension 01/07/2007   Gout, unspecified 01/07/2007    ONSET DATE: 04/2023 (MD appt)  REFERRING DIAG: falls, gait instability  THERAPY DIAG:  Muscle weakness (generalized)  Balance disorder  Rationale for Evaluation and Treatment: Rehabilitation  SUBJECTIVE:  SUBJECTIVE STATEMENT: Patient reports he tripped and fell [place]; patient states he continues to feel unsteady when leaning down and walking downhill. Patient states he trips over his feet when walking due to shuffling his feet.  From eval: Pt states that he starts every morning feeling stiff from his knees to his low back. He states when he wakes up he feels like I've been beat with a stick. He also states I wobble all the time. MD suspects balance deficits are due to LE weakness. Pt states after he plays golf he hurts so bad he can barely walk from his truck back into the house. He states he walks his dog 5 minutes at a time to improve endurance. He states he usually falls when bending forward to put his golf tee in the ground or when looking down Pt accompanied by: significant other  PERTINENT HISTORY: CVA 2016  PAIN:  Are you having pain? Yes: NPRS scale: 8/10 Pain location: both knees and hips Pain description: sore, achey Aggravating factors: standing, walking, golf Relieving factors: meds, heat  PRECAUTIONS: Fall  RED FLAGS: None   WEIGHT BEARING RESTRICTIONS: No  FALLS: Has patient fallen in last 6 months? Yes. Number of falls multiple.  I fell this morning getting ready  LIVING ENVIRONMENT: Lives with: lives with their family Lives in: House/apartment Stairs: No Has following equipment at home: Single point cane and Walker - 2 wheeled  PLOF: Independent  PATIENT GOALS: not be stiff in the morning, less pain, improve balance  OBJECTIVE:  Note: Objective measures were completed at Evaluation unless otherwise noted.  COGNITION: Overall cognitive status: Within functional limits for tasks assessed   SENSATION: Light touch: Impaired    LOWER EXTREMITY MMT:    MMT Right Eval Left Eval  Hip flexion 4- 4-  Hip extension    Hip abduction 3+ 3+  Hip adduction    Hip internal rotation    Hip external rotation    Knee flexion 4- 4  Knee extension 4 4  Ankle dorsiflexion    Ankle plantarflexion    Ankle inversion    Ankle eversion    (Blank rows = not tested)   GAIT: Gait pattern: decreased step length- Right, decreased step length- Left, and wide BOS Distance walked: 100' Assistive device utilized: None Level of assistance: Complete Independence Comments: wide BOS, drifts Rt  FUNCTIONAL TESTS:  5 times sit to stand: 20.41 seconds Timed up and go (TUG): 16.15 seconds Dynamic Gait Index: 8/24  [facility] Adult PT Treatment:                                                DATE: 05/02/2023 Therapeutic Exercise: Standing: Toe raises (discontinued d/t lower leg pain on R) Heel raises + 4 ball b/w ankles 3x10 Runner's lunge stretch (B) Staggered stance step fwd/bkwd over dumbbell Lateral step over in/out + yoga block (B)  Seated: Straight leg raises abd/add over yoga block on ground --> elevated on stepper (reps to fatigue) Resisted ankle DF + RTB 2x15 Seated --> standing toe raises (no change in R pain) Double leg woodpecker (A/P weight shifting Supine HS & ITB stretches with strap (hooklying) 2x30 each S/L resisted clamshells + RTB 2x10 (B)    [facility] Adult PT Treatment:  DATE: 04/27/2023 Therapeutic Exercise: Supine: Bent knee fallouts, R and L, x 10 each Hamstring stretch, 90/90 position, x 3 min per side Neuromuscular re-ed: Standing: Airex beam side steps, R and L, x 3 each Airex beam forward/backward weight shifts, x 10 each Narrow width forward (5) and backward (5) steps, x 3 sets Forward/backward weight shifts, 3 x 30 4 step toe taps R and L, 2 x 20 Band assisted forward/backward weight shifts, x 10 each   [facility] Adult PT Treatment:                                                DATE: 04/20/2023 Manual Therapy: Sidelying: General PA glides throughout spine and rib cage Therapeutic Exercise: Supine: Hip PROM, FABER and FADIR working in varying amounts of hip flexion Hamstring stretch, 90/90 position Seated: Fitter Board knee flexion, light band, 2 x 20 reps Fitter Board knee extension, light band, 2 x 20 reps Standing: Back against wall slider disc forward reach, x 10 per side Retro manual walking on treadmill, x 30 Cable column backward walk outs, 25 lbs, x 5 rounds, slow/controlled return Neuromuscular re-ed: Standing: Toe touches, toes elevated on wedge, x 10, contact guard assist Toe touches, heels elevated on wedge, x 10, contact guard assist   PATIENT EDUCATION: Education details: Updated HEP Person educated: Patient Education method: Explanation, Demonstration, and Handouts Education comprehension: verbalized understanding and returned demonstration  HOME EXERCISE PROGRAM: Access Code: [email or web address] URL: [email or web address] Date: 05/02/2023 Prepared by: Lamarr Price  Exercises - Standing Hip Abduction with Counter Support  - 1 x daily - 7 x weekly - 3 sets - 10 reps - Standing Knee Flexion AROM with Chair Support  - 1 x daily - 7 x weekly - 3 sets - 10 reps - Heel Toe Raises with Counter Support  - 1 x daily - 7 x weekly - 3 sets - 10 reps - Sit to Stand  - 1 x daily - 7 x weekly - 3 sets - 5  reps - Forward Backward Weight Shift with Counter Support  - 1 x daily - 7 x weekly - 3 sets - 10 reps - Clamshell with Resistance  - 1 x daily - 7 x weekly - 3 sets - 10 reps - Supine Bridge  - 1 x daily - 7 x weekly - 3 sets - 10 reps - Woodpeckers Two Legs  - 1 x daily - 7 x weekly - 3 sets - 10 reps  GOALS: Goals reviewed with patient? Yes  SHORT TERM GOALS: Target date: 05/09/2023  Pt will be independent with initial HEP Baseline: Goal status: INITIAL  2.  Pt will demo improved LE strength with 5 x STS <= 17 seconds Baseline:  Goal status: INITIAL  LONG TERM GOALS: Target date: 06/06/2023  Pt will be independent with advanced HEP Baseline:  Goal status: INITIAL  2.  Pt will demo decreased fall risk with DGI >= 16/24 Baseline:  Goal status: INITIAL  3.  Pt will demo improved gait and balance with TUG <= 14 seconds Baseline:  Goal status: INITIAL  4.  Pt will report being able to put golf tee in the ground without falling on 10/10 trials Baseline:  Goal status: INITIAL  5.  Pt will report 50% reduction in stiffness when waking up in the morning Baseline:  Goal status: INITIAL  ASSESSMENT:  CLINICAL IMPRESSION: Ankle strengthening incorporated with exercises to progress LE stability and dynamic balance; patient challenged with small range anterior weight shifting and maintaining proper postural control and functional ankle dorsiflexion stability. Hip strengthening progressed with added resisted clamshells.  From eval: Patient is a 82 y.o. male who was seen today for physical therapy evaluation and treatment for gait instability and falls. He presents with decreased strength, increased fall risk, impaired balance and gait and will benefit from skilled PT to address deficits and improve functional mobility and reduce risk and frequency of falls.   OBJECTIVE IMPAIRMENTS: Abnormal gait, decreased activity tolerance, decreased balance, and decreased strength.    ACTIVITY LIMITATIONS: standing, transfers, and locomotion level  PARTICIPATION LIMITATIONS: community activity and yard work  PERSONAL FACTORS: Time since onset of injury/illness/exacerbation and 1 comorbidity: CVA  are also affecting patient's functional outcome.   REHAB POTENTIAL: Good  CLINICAL DECISION MAKING: Evolving/moderate complexity  EVALUATION COMPLEXITY: Moderate  PLAN:  PT FREQUENCY: 2x/week  PT DURATION: 8 weeks  PLANNED INTERVENTIONS: 97164- PT Re-evaluation, 97110-Therapeutic exercises, 97530- Therapeutic activity, [id number]- Neuromuscular re-education, 97535- Self Care, [id number]- Manual therapy, [id number]- Aquatic Therapy, 97014- Electrical stimulation (unattended), 97033- Ionotophoresis 4mg /ml Dexamethasone, Patient/Family education, Balance training, Stair training, Taping, Dry Needling, Cryotherapy, and Moist heat  PLAN FOR NEXT SESSION: Ankle & hip strengthening. Static and dynamic balance. Add balance to HEP   Lamarr GORMAN Price, PTA 05/02/2023, 11:05 AM

## 2023-05-03 ENCOUNTER — Other Ambulatory Visit: Payer: Self-pay | Admitting: Family Medicine

## 2023-05-03 DIAGNOSIS — I700 Atherosclerosis of aorta: Secondary | ICD-10-CM

## 2023-05-04 ENCOUNTER — Encounter: Payer: Medicare Other | Admitting: Physical Therapy

## 2023-05-04 ENCOUNTER — Telehealth: Payer: Self-pay

## 2023-05-04 DIAGNOSIS — I739 Peripheral vascular disease, unspecified: Secondary | ICD-10-CM

## 2023-05-04 DIAGNOSIS — M79605 Pain in left leg: Secondary | ICD-10-CM | POA: Diagnosis not present

## 2023-05-04 DIAGNOSIS — M79661 Pain in right lower leg: Secondary | ICD-10-CM | POA: Diagnosis not present

## 2023-05-04 DIAGNOSIS — M79604 Pain in right leg: Secondary | ICD-10-CM | POA: Diagnosis not present

## 2023-05-04 DIAGNOSIS — I87393 Chronic venous hypertension (idiopathic) with other complications of bilateral lower extremity: Secondary | ICD-10-CM | POA: Diagnosis not present

## 2023-05-04 DIAGNOSIS — M79662 Pain in left lower leg: Secondary | ICD-10-CM | POA: Diagnosis not present

## 2023-05-04 NOTE — Telephone Encounter (Signed)
 Orders Placed This Encounter  Procedures   Ambulatory referral to Vascular Surgery    Referral Priority:   Routine    Referral Type:   Surgical    Referral Reason:   Specialty Services Required    Referred to Provider:   Serene Gaile ORN, MD    Requested Specialty:   Vascular Surgery    Number of Visits Requested:   1

## 2023-05-04 NOTE — Telephone Encounter (Signed)
 Copied from CRM [id number]. Topic: General - Call Back - No Documentation >> May 04, 2023 12:51 PM Mosetta Putt H wrote: Reason for CRM: brent a specialist needs a call back from nurse or doctor

## 2023-05-04 NOTE — Telephone Encounter (Signed)
 Patient was referred to Dr Consuela Mimes and he needs to be referred to a vascular surgeon, per Dr Ali Lowe office.   They suggested Dr Coral Else.   Pended new referral.

## 2023-05-06 ENCOUNTER — Other Ambulatory Visit: Payer: Self-pay | Admitting: Family Medicine

## 2023-05-09 ENCOUNTER — Ambulatory Visit: Payer: Medicare Other | Attending: Family Medicine | Admitting: Physical Therapy

## 2023-05-09 ENCOUNTER — Encounter: Payer: Self-pay | Admitting: Physical Therapy

## 2023-05-09 DIAGNOSIS — M6281 Muscle weakness (generalized): Secondary | ICD-10-CM | POA: Diagnosis not present

## 2023-05-09 DIAGNOSIS — R2689 Other abnormalities of gait and mobility: Secondary | ICD-10-CM | POA: Insufficient documentation

## 2023-05-09 NOTE — Therapy (Signed)
 OUTPATIENT PHYSICAL THERAPY NEURO TREATMENT NOTE   Patient Name: Odai Wimmer MRN: [id number] DOB:08-18-1940, 83 y.o., male Today's Date: 05/09/2023   PCP: Alvan REFERRING PROVIDER: Alvan  END OF SESSION:  PT End of Session - 05/09/23 1054     Visit Number 5    Number of Visits 16    Date for PT Re-Evaluation 06/06/23    Authorization Type [employer] medicare    Authorization - Visit Number 5    Progress Note Due on Visit 10    PT Start Time 1015    PT Stop Time 1057    PT Time Calculation (min) 42 min    Activity Tolerance Patient tolerated treatment well    Behavior During Therapy WFL for tasks assessed/performed             Past Medical History:  Diagnosis Date   Atrial fibrillation (HCC)    CVA (cerebral vascular accident) (HCC)    Diabetes mellitus (HCC)    GERD (gastroesophageal reflux disease)    Hyperlipidemia    PUD (peptic ulcer disease)    Past Surgical History:  Procedure Laterality Date   CATARACT EXTRACTION W/ INTRAOCULAR LENS IMPLANT Right    Patient Active Problem List   Diagnosis Date Noted   Weakness of both lower extremities 04/04/2023   CKD stage G3b/A2, GFR 30-44 and albumin creatinine ratio 30-299 mg/g (HCC) 01/03/2023   Primary osteoarthritis of right hand 07/18/2022   Lumbar spondylosis 05/09/2022   Primary osteoarthritis of left hip 04/13/2022   AV block, 1st degree 07/05/2021   Idiopathic peripheral neuropathy 09/16/2019   BMI 31.0-31.9,adult 03/26/2019   Obesity, Class I, BMI 30.0-34.9 (see actual BMI) 08/22/2018   Aortic atherosclerosis (HCC) 03/12/2018   Primary osteoarthritis of both knees 01/03/2018   BPH (benign prostatic hyperplasia) 07/10/2017   INO (internuclear ophthalmoplegia), left 07/03/2017   Hearing loss 07/03/2017   ED (erectile dysfunction) 07/03/2017   Paroxysmal A-fib (HCC) 12/12/2016   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) 12/09/2016   Thrombsis of left atrial  appendage without antecedent myocardial infarction 12/09/2016   Controlled diabetes mellitus type 2 with complications (HCC) 08/14/2008   Hyperlipidemia associated with type 2 diabetes mellitus (HCC) 05/20/2008   Sleep apnea 07/19/2007   Allergic rhinitis 01/07/2007   Benign essential hypertension 01/07/2007   Gout, unspecified 01/07/2007    ONSET DATE: 04/2023 (MD appt)  REFERRING DIAG: falls, gait instability  THERAPY DIAG:  Muscle weakness (generalized)  Balance disorder  Rationale for Evaluation and Treatment: Rehabilitation  SUBJECTIVE:  SUBJECTIVE STATEMENT: Pt states he still feels stiff in the morning. He has not been golfing due to the cold weather. He states he has been walking short distances  From eval: Pt states that he starts every morning feeling stiff from his knees to his low back. He states when he wakes up he feels like I've been beat with a stick. He also states I wobble all the time. MD suspects balance deficits are due to LE weakness. Pt states after he plays golf he hurts so bad he can barely walk from his truck back into the house. He states he walks his dog 5 minutes at a time to improve endurance. He states he usually falls when bending forward to put his golf tee in the ground or when looking down Pt accompanied by: significant other  PERTINENT HISTORY: CVA 2016  PAIN:  Are you having pain? Yes: NPRS scale: 6/10 Pain location: both knees and hips Pain description: sore, achey Aggravating factors: standing, walking, golf Relieving factors: meds, heat  PRECAUTIONS: Fall  RED FLAGS: None   WEIGHT BEARING RESTRICTIONS: No  FALLS: Has patient fallen in last 6 months? Yes. Number of falls multiple. I fell this morning getting ready  LIVING ENVIRONMENT: Lives  with: lives with their family Lives in: House/apartment Stairs: No Has following equipment at home: Single point cane and Walker - 2 wheeled  PLOF: Independent  PATIENT GOALS: not be stiff in the morning, less pain, improve balance  OBJECTIVE:  Note: Objective measures were completed at Evaluation unless otherwise noted.  COGNITION: Overall cognitive status: Within functional limits for tasks assessed   SENSATION: Light touch: Impaired    LOWER EXTREMITY MMT:    MMT Right Eval Left Eval  Hip flexion 4- 4-  Hip extension    Hip abduction 3+ 3+  Hip adduction    Hip internal rotation    Hip external rotation    Knee flexion 4- 4  Knee extension 4 4  Ankle dorsiflexion    Ankle plantarflexion    Ankle inversion    Ankle eversion    (Blank rows = not tested)   GAIT: Gait pattern: decreased step length- Right, decreased step length- Left, and wide BOS Distance walked: 100' Assistive device utilized: None Level of assistance: Complete Independence Comments: wide BOS, drifts Rt  FUNCTIONAL TESTS:  5 times sit to stand: 20.41 seconds Timed up and go (TUG): 16.15 seconds Dynamic Gait Index: 8/24  [facility] Adult PT Treatment:                                                DATE: 05/09/23 Therapeutic Exercise: 5 x STS: 20.28 seconds Heel raises 3 x 10 Runners lunge Stepping over obstacles laterally, A/P Side step on airex beam with UE support  A/P wt shifts on airex beam with UE support x 10 Rocker board A/P with UE support x 1 min Bending to reach items off of 8'' step --> off of ground level with CGA Supine HS stretch 3 x 30 sec bilat LTR x 10 bilat Resisted clam shells green TB 2 x 10 Bent knee fall outs x 10 bilat   [facility] Adult PT Treatment:  DATE: 05/02/2023 Therapeutic Exercise: Standing: Toe raises (discontinued d/t lower leg pain on R) Heel raises + 4 ball b/w ankles 3x10 Runner's lunge stretch (B) Staggered  stance step fwd/bkwd over dumbbell Lateral step over in/out + yoga block (B)  Seated: Straight leg raises abd/add over yoga block on ground --> elevated on stepper (reps to fatigue) Resisted ankle DF + RTB 2x15 Seated --> standing toe raises (no change in R pain) Double leg woodpecker (A/P weight shifting Supine HS & ITB stretches with strap (hooklying) 2x30 each S/L resisted clamshells + RTB 2x10 (B)    [facility] Adult PT Treatment:                                                DATE: 04/27/2023 Therapeutic Exercise: Supine: Bent knee fallouts, R and L, x 10 each Hamstring stretch, 90/90 position, x 3 min per side Neuromuscular re-ed: Standing: Airex beam side steps, R and L, x 3 each Airex beam forward/backward weight shifts, x 10 each Narrow width forward (5) and backward (5) steps, x 3 sets Forward/backward weight shifts, 3 x 30 4 step toe taps R and L, 2 x 20 Band assisted forward/backward weight shifts, x 10 each    PATIENT EDUCATION: Education details: Updated HEP Person educated: Patient Education method: Explanation, Demonstration, and Handouts Education comprehension: verbalized understanding and returned demonstration  HOME EXERCISE PROGRAM: Access Code: [email or web address] URL: [email or web address] Date: 05/02/2023 Prepared by: Lamarr Price  Exercises - Standing Hip Abduction with Counter Support  - 1 x daily - 7 x weekly - 3 sets - 10 reps - Standing Knee Flexion AROM with Chair Support  - 1 x daily - 7 x weekly - 3 sets - 10 reps - Heel Toe Raises with Counter Support  - 1 x daily - 7 x weekly - 3 sets - 10 reps - Sit to Stand  - 1 x daily - 7 x weekly - 3 sets - 5 reps - Forward Backward Weight Shift with Counter Support  - 1 x daily - 7 x weekly - 3 sets - 10 reps - Clamshell with Resistance  - 1 x daily - 7 x weekly - 3 sets - 10 reps - Supine Bridge  - 1 x daily - 7 x weekly - 3 sets - 10 reps - Woodpeckers Two Legs  - 1 x daily - 7 x weekly - 3  sets - 10 reps  GOALS: Goals reviewed with patient? Yes  SHORT TERM GOALS: Target date: 05/09/2023  Pt will be independent with initial HEP Baseline: Goal status: IN PROGRESS  2.  Pt will demo improved LE strength with 5 x STS <= 17 seconds Baseline:  Goal status: IN PROGRESS  LONG TERM GOALS: Target date: 06/06/2023  Pt will be independent with advanced HEP Baseline:  Goal status: INITIAL  2.  Pt will demo decreased fall risk with DGI >= 16/24 Baseline:  Goal status: INITIAL  3.  Pt will demo improved gait and balance with TUG <= 14 seconds Baseline:  Goal status: INITIAL  4.  Pt will report being able to put golf tee in the ground without falling on 10/10 trials Baseline:  Goal status: INITIAL  5.  Pt will report 50% reduction in stiffness when waking up in the morning Baseline:  Goal status: INITIAL   ASSESSMENT:  CLINICAL IMPRESSION: Pt with  no change in 5 x STS since eval. He states he is feeling less stiff in the mornings. He continues with decreased balance and high risk of falls. Good performance with picking up objects off of ground when cued to bend his knees  From eval: Patient is a 83 y.o. male who was seen today for physical therapy evaluation and treatment for gait instability and falls. He presents with decreased strength, increased fall risk, impaired balance and gait and will benefit from skilled PT to address deficits and improve functional mobility and reduce risk and frequency of falls.   OBJECTIVE IMPAIRMENTS: Abnormal gait, decreased activity tolerance, decreased balance, and decreased strength.    PLAN:  PT FREQUENCY: 2x/week  PT DURATION: 8 weeks  PLANNED INTERVENTIONS: 97164- PT Re-evaluation, 97110-Therapeutic exercises, 97530- Therapeutic activity, [id number]- Neuromuscular re-education, 97535- Self Care, 02859- Manual therapy, [id number]- Aquatic Therapy, 97014- Electrical stimulation (unattended), [phone]- Ionotophoresis 4mg /ml Dexamethasone,  Patient/Family education, Balance training, Stair training, Taping, Dry Needling, Cryotherapy, and Moist heat  PLAN FOR NEXT SESSION: Ankle & hip strengthening. Static and dynamic balance. Add balance to HEP   Evangelynn Lochridge, PT 05/09/2023, 10:55 AM

## 2023-05-11 ENCOUNTER — Ambulatory Visit: Payer: Medicare Other

## 2023-05-11 DIAGNOSIS — M6281 Muscle weakness (generalized): Secondary | ICD-10-CM | POA: Diagnosis not present

## 2023-05-11 DIAGNOSIS — R2689 Other abnormalities of gait and mobility: Secondary | ICD-10-CM

## 2023-05-11 NOTE — Therapy (Signed)
 OUTPATIENT PHYSICAL THERAPY NEURO TREATMENT NOTE   Patient Name: Tristan Mayer MRN: [id number] DOB:[birth year], 83 y.o., male Today's Date: 05/11/2023   PCP: Alvan REFERRING PROVIDER: Alvan  END OF SESSION:  PT End of Session - 05/11/23 1020     Visit Number 6    Number of Visits 16    Date for PT Re-Evaluation 06/06/23    Authorization Type [employer] medicare    Progress Note Due on Visit 10    PT Start Time 1020    PT Stop Time 1058    PT Time Calculation (min) 38 min    Activity Tolerance Patient tolerated treatment well    Behavior During Therapy WFL for tasks assessed/performed             Past Medical History:  Diagnosis Date   Atrial fibrillation (HCC)    CVA (cerebral vascular accident) (HCC)    Diabetes mellitus (HCC)    GERD (gastroesophageal reflux disease)    Hyperlipidemia    PUD (peptic ulcer disease)    Past Surgical History:  Procedure Laterality Date   CATARACT EXTRACTION W/ INTRAOCULAR LENS IMPLANT Right    Patient Active Problem List   Diagnosis Date Noted   Weakness of both lower extremities 04/04/2023   CKD stage G3b/A2, GFR 30-44 and albumin creatinine ratio 30-299 mg/g (HCC) 01/03/2023   Primary osteoarthritis of right hand 07/18/2022   Lumbar spondylosis 05/09/2022   Primary osteoarthritis of left hip 04/13/2022   AV block, 1st degree 07/05/2021   Idiopathic peripheral neuropathy 09/16/2019   BMI 31.0-31.9,adult 03/26/2019   Obesity, Class I, BMI 30.0-34.9 (see actual BMI) 08/22/2018   Aortic atherosclerosis (HCC) 03/12/2018   Primary osteoarthritis of both knees 01/03/2018   BPH (benign prostatic hyperplasia) 07/10/2017   INO (internuclear ophthalmoplegia), left 07/03/2017   Hearing loss 07/03/2017   ED (erectile dysfunction) 07/03/2017   Paroxysmal A-fib (HCC) 12/12/2016   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) 12/09/2016   Thrombsis of left atrial appendage without antecedent  myocardial infarction 12/09/2016   Controlled diabetes mellitus type 2 with complications (HCC) 08/14/2008   Hyperlipidemia associated with type 2 diabetes mellitus (HCC) 05/20/2008   Sleep apnea 07/19/2007   Allergic rhinitis 01/07/2007   Benign essential hypertension 01/07/2007   Gout, unspecified 01/07/2007    ONSET DATE: 04/2023 (MD appt)  REFERRING DIAG: falls, gait instability  THERAPY DIAG:  Muscle weakness (generalized)  Balance disorder  Rationale for Evaluation and Treatment: Rehabilitation  SUBJECTIVE:  SUBJECTIVE STATEMENT: Pt states he still feels stiff in the morning. He has not been golfing due to the cold weather. He states he has been walking short distances  From eval: Pt states that he starts every morning feeling stiff from his knees to his low back. He states when he wakes up he feels like I've been beat with a stick. He also states I wobble all the time. MD suspects balance deficits are due to LE weakness. Pt states after he plays golf he hurts so bad he can barely walk from his truck back into the house. He states he walks his dog 5 minutes at a time to improve endurance. He states he usually falls when bending forward to put his golf tee in the ground or when looking down Pt accompanied by: significant other  PERTINENT HISTORY: CVA 2016  PAIN:  Are you having pain? Yes: NPRS scale: 6/10 Pain location: both knees and hips Pain description: sore, achey Aggravating factors: standing, walking, golf Relieving factors: meds, heat  PRECAUTIONS: Fall  RED FLAGS: None   WEIGHT BEARING RESTRICTIONS: No  FALLS: Has patient fallen in last 6 months? Yes. Number of falls multiple. I fell this morning getting ready  LIVING ENVIRONMENT: Lives with: lives with their  family Lives in: House/apartment Stairs: No Has following equipment at home: Single point cane and Walker - 2 wheeled  PLOF: Independent  PATIENT GOALS: not be stiff in the morning, less pain, improve balance  OBJECTIVE:  Note: Objective measures were completed at Evaluation unless otherwise noted.  COGNITION: Overall cognitive status: Within functional limits for tasks assessed   SENSATION: Light touch: Impaired    LOWER EXTREMITY MMT:    MMT Right Eval Left Eval  Hip flexion 4- 4-  Hip extension    Hip abduction 3+ 3+  Hip adduction    Hip internal rotation    Hip external rotation    Knee flexion 4- 4  Knee extension 4 4  Ankle dorsiflexion    Ankle plantarflexion    Ankle inversion    Ankle eversion    (Blank rows = not tested)   GAIT: Gait pattern: decreased step length- Right, decreased step length- Left, and wide BOS Distance walked: 100' Assistive device utilized: None Level of assistance: Complete Independence Comments: wide BOS, drifts Rt  FUNCTIONAL TESTS:  5 times sit to stand: 20.41 seconds Timed up and go (TUG): 16.15 seconds Dynamic Gait Index: 8/24  [facility] Adult PT Treatment:                                                DATE: 05/11/2023 Therapeutic Exercise: Standing: Toe raises x20 Heel raises x20 Runner's lunge stretch 4x30 (B) 6 step up/down 2x1 min Hip abd 2x10 (B) Hip ext x10 (B) Marching x 45 Seated: Straight leg raises abd/add over 5#KB --> elevated on stepper x10, x12 (B) STS x10  Side Lying: Straight leg hip abd 2x10 Bent knee ext in abd x10 Supine:  HS & ITB stretches w/strap 2x30 (B)  [facility] Adult PT Treatment:                                                DATE: 05/09/23 Therapeutic Exercise: 5 x  STS: 20.28 seconds Heel raises 3 x 10 Runners lunge Stepping over obstacles laterally, A/P Side step on airex beam with UE support  A/P wt shifts on airex beam with UE support x 10 Rocker board A/P with UE support x 1  min Bending to reach items off of 8'' step --> off of ground level with CGA Supine HS stretch 3 x 30 sec bilat LTR x 10 bilat Resisted clam shells green TB 2 x 10 Bent knee fall outs x 10 bilat   [facility] Adult PT Treatment:                                                DATE: 05/02/2023 Therapeutic Exercise: Standing: Toe raises (discontinued d/t lower leg pain on R) Heel raises + 4 ball b/w ankles 3x10 Runner's lunge stretch (B) Staggered stance step fwd/bkwd over dumbbell Lateral step over in/out + yoga block (B)  Seated: Straight leg raises abd/add over yoga block on ground --> elevated on stepper (reps to fatigue) Resisted ankle DF + RTB 2x15 Seated --> standing toe raises (no change in R pain) Double leg woodpecker (A/P weight shifting Supine HS & ITB stretches with strap (hooklying) 2x30 each S/L resisted clamshells + RTB 2x10 (B)   PATIENT EDUCATION: Education details: Updated HEP Person educated: Patient Education method: Explanation, Demonstration, and Handouts Education comprehension: verbalized understanding and returned demonstration  HOME EXERCISE PROGRAM: Access Code: [email or web address] URL: [email or web address] Date: 05/11/2023 Prepared by: Lamarr Price  Exercises - Standing Hip Abduction with Counter Support  - 1 x daily - 7 x weekly - 3 sets - 10 reps - Standing Knee Flexion AROM with Chair Support  - 1 x daily - 7 x weekly - 3 sets - 10 reps - Heel Toe Raises with Counter Support  - 1 x daily - 7 x weekly - 3 sets - 10 reps - Sit to Stand  - 1 x daily - 7 x weekly - 3 sets - 5 reps - Forward Backward Weight Shift with Counter Support  - 1 x daily - 7 x weekly - 3 sets - 10 reps - Clamshell with Resistance  - 1 x daily - 7 x weekly - 3 sets - 10 reps - Supine Bridge  - 1 x daily - 7 x weekly - 3 sets - 10 reps - Woodpeckers Two Legs  - 1 x daily - 7 x weekly - 3 sets - 10 reps - Semi-Tandem Corner Balance With Eyes Open  - 1 x daily - 7 x weekly -  3 sets - 10 reps  GOALS: Goals reviewed with patient? Yes  SHORT TERM GOALS: Target date: 05/09/2023  Pt will be independent with initial HEP Baseline: Goal status: IN PROGRESS  2.  Pt will demo improved LE strength with 5 x STS <= 17 seconds Baseline:  Goal status: IN PROGRESS  LONG TERM GOALS: Target date: 06/06/2023  Pt will be independent with advanced HEP Baseline:  Goal status: INITIAL  2.  Pt will demo decreased fall risk with DGI >= 16/24 Baseline:  Goal status: INITIAL  3.  Pt will demo improved gait and balance with TUG <= 14 seconds Baseline:  Goal status: INITIAL  4.  Pt will report being able to put golf tee in the ground without falling on 10/10 trials Baseline:  Goal status: INITIAL  5.  Pt will report 50% reduction in stiffness when waking up in the morning Baseline:  Goal status: INITIAL   ASSESSMENT:  CLINICAL IMPRESSION: Noted LE weakness with increased fatigue during standing hip strengthening exercises. Increased instability in R LE weight bearing during step ups; patient cued to lead with L LE during stair navigation. Slow pace demonstrated with repeated sit to stand exercise, with patient reported increased bilateral knee pain towards end of set.   From eval: Patient is a 83 y.o. male who was seen today for physical therapy evaluation and treatment for gait instability and falls. He presents with decreased strength, increased fall risk, impaired balance and gait and will benefit from skilled PT to address deficits and improve functional mobility and reduce risk and frequency of falls.   OBJECTIVE IMPAIRMENTS: Abnormal gait, decreased activity tolerance, decreased balance, and decreased strength.    PLAN:  PT FREQUENCY: 2x/week  PT DURATION: 8 weeks  PLANNED INTERVENTIONS: 97164- PT Re-evaluation, 97110-Therapeutic exercises, 97530- Therapeutic activity, [id number]- Neuromuscular re-education, 97535- Self Care, 02859- Manual therapy, [id number]-  Aquatic Therapy, 97014- Electrical stimulation (unattended), [phone]- Ionotophoresis 4mg /ml Dexamethasone, Patient/Family education, Balance training, Stair training, Taping, Dry Needling, Cryotherapy, and Moist heat  PLAN FOR NEXT SESSION: Review new HEP exercise (staggered stance balance in corner). Ankle & hip strengthening. Static and dynamic balance. Update HEP with more balance  Lamarr GORMAN Price, PTA 05/11/2023, 10:59 AM

## 2023-05-14 ENCOUNTER — Other Ambulatory Visit: Payer: Self-pay | Admitting: Family Medicine

## 2023-05-16 ENCOUNTER — Encounter: Payer: Self-pay | Admitting: Physical Therapy

## 2023-05-16 ENCOUNTER — Ambulatory Visit: Payer: Medicare Other | Admitting: Physical Therapy

## 2023-05-16 ENCOUNTER — Other Ambulatory Visit: Payer: Self-pay | Admitting: Family Medicine

## 2023-05-16 DIAGNOSIS — R2689 Other abnormalities of gait and mobility: Secondary | ICD-10-CM | POA: Diagnosis not present

## 2023-05-16 DIAGNOSIS — M6281 Muscle weakness (generalized): Secondary | ICD-10-CM | POA: Diagnosis not present

## 2023-05-16 NOTE — Therapy (Signed)
 OUTPATIENT PHYSICAL THERAPY NEURO TREATMENT NOTE   Patient Name: Tristan Mayer MRN: [id number] DOB:04-28-1941, 83 y.o., male Today's Date: 05/16/2023   PCP: Alvan REFERRING PROVIDER: Alvan  END OF SESSION:  PT End of Session - 05/16/23 1049     Visit Number 7    Number of Visits 16    Date for PT Re-Evaluation 06/06/23    Authorization Type [employer] medicare    Authorization - Visit Number 7    Progress Note Due on Visit 10    PT Start Time 1015    PT Stop Time 1055    PT Time Calculation (min) 40 min    Equipment Utilized During Treatment Gait belt    Activity Tolerance Patient tolerated treatment well              Past Medical History:  Diagnosis Date   Atrial fibrillation (HCC)    CVA (cerebral vascular accident) (HCC)    Diabetes mellitus (HCC)    GERD (gastroesophageal reflux disease)    Hyperlipidemia    PUD (peptic ulcer disease)    Past Surgical History:  Procedure Laterality Date   CATARACT EXTRACTION W/ INTRAOCULAR LENS IMPLANT Right    Patient Active Problem List   Diagnosis Date Noted   Weakness of both lower extremities 04/04/2023   CKD stage G3b/A2, GFR 30-44 and albumin creatinine ratio 30-299 mg/g (HCC) 01/03/2023   Primary osteoarthritis of right hand 07/18/2022   Lumbar spondylosis 05/09/2022   Primary osteoarthritis of left hip 04/13/2022   AV block, 1st degree 07/05/2021   Idiopathic peripheral neuropathy 09/16/2019   BMI 31.0-31.9,adult 03/26/2019   Obesity, Class I, BMI 30.0-34.9 (see actual BMI) 08/22/2018   Aortic atherosclerosis (HCC) 03/12/2018   Primary osteoarthritis of both knees 01/03/2018   BPH (benign prostatic hyperplasia) 07/10/2017   INO (internuclear ophthalmoplegia), left 07/03/2017   Hearing loss 07/03/2017   ED (erectile dysfunction) 07/03/2017   Paroxysmal A-fib (HCC) 12/12/2016   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) 12/09/2016   Thrombsis of left atrial  appendage without antecedent myocardial infarction 12/09/2016   Controlled diabetes mellitus type 2 with complications (HCC) 08/14/2008   Hyperlipidemia associated with type 2 diabetes mellitus (HCC) 05/20/2008   Sleep apnea 07/19/2007   Allergic rhinitis 01/07/2007   Benign essential hypertension 01/07/2007   Gout, unspecified 01/07/2007    ONSET DATE: 04/2023 (MD appt)  REFERRING DIAG: falls, gait instability  THERAPY DIAG:  Muscle weakness (generalized)  Balance disorder  Rationale for Evaluation and Treatment: Rehabilitation  SUBJECTIVE:  SUBJECTIVE STATEMENT: Pt states he started to take his dog for a walk but it was too icy. He plans to try golfing on Friday.  From eval: Pt states that he starts every morning feeling stiff from his knees to his low back. He states when he wakes up he feels like I've been beat with a stick. He also states I wobble all the time. MD suspects balance deficits are due to LE weakness. Pt states after he plays golf he hurts so bad he can barely walk from his truck back into the house. He states he walks his dog 5 minutes at a time to improve endurance. He states he usually falls when bending forward to put his golf tee in the ground or when looking down Pt accompanied by: significant other  PERTINENT HISTORY: CVA 2016  PAIN:  Are you having pain? Yes: NPRS scale: 6/10 Pain location: both knees and hips Pain description: sore, achey Aggravating factors: standing, walking, golf Relieving factors: meds, heat  PRECAUTIONS: Fall  RED FLAGS: None   WEIGHT BEARING RESTRICTIONS: No  FALLS: Has patient fallen in last 6 months? Yes. Number of falls multiple. I fell this morning getting ready  LIVING ENVIRONMENT: Lives with: lives with their family Lives in:  House/apartment Stairs: No Has following equipment at home: Single point cane and Walker - 2 wheeled  PLOF: Independent  PATIENT GOALS: not be stiff in the morning, less pain, improve balance  OBJECTIVE:  Note: Objective measures were completed at Evaluation unless otherwise noted.  COGNITION: Overall cognitive status: Within functional limits for tasks assessed   SENSATION: Light touch: Impaired    LOWER EXTREMITY MMT:    MMT Right Eval Left Eval  Hip flexion 4- 4-  Hip extension    Hip abduction 3+ 3+  Hip adduction    Hip internal rotation    Hip external rotation    Knee flexion 4- 4  Knee extension 4 4  Ankle dorsiflexion    Ankle plantarflexion    Ankle inversion    Ankle eversion    (Blank rows = not tested)   GAIT: Gait pattern: decreased step length- Right, decreased step length- Left, and wide BOS Distance walked: 100' Assistive device utilized: None Level of assistance: Complete Independence Comments: wide BOS, drifts Rt  FUNCTIONAL TESTS:  5 times sit to stand: 20.41 seconds Timed up and go (TUG): 16.15 seconds Dynamic Gait Index: 8/24  [facility] Adult PT Treatment:                                                DATE: 05/16/23 Therapeutic Exercise: STS 2 x 5 Seated dead lift 10# KB x 10 Standing partial dead lift 10# KB x 10 Step ups 6'' 2 x 10 Toe raise x 20 Heel raise x 20 Bow and arrow green TB - requires min A for balance Resisted walking backward, laterally 10# - min A for balance Staggered stance balance 2 x 30 sec bilat Straight leg raises abd/add over 5#KB x 15 bilat   [facility] Adult PT Treatment:                                                DATE: 05/11/2023 Therapeutic Exercise: Standing:  Toe raises x20 Heel raises x20 Runner's lunge stretch 4x30 (B) 6 step up/down 2x1 min Hip abd 2x10 (B) Hip ext x10 (B) Marching x 45 Seated: Straight leg raises abd/add over 5#KB --> elevated on stepper x10, x12 (B) STS x10  Side  Lying: Straight leg hip abd 2x10 Bent knee ext in abd x10 Supine:  HS & ITB stretches w/strap 2x30 (B)  [facility] Adult PT Treatment:                                                DATE: 05/09/23 Therapeutic Exercise: 5 x STS: 20.28 seconds Heel raises 3 x 10 Runners lunge Stepping over obstacles laterally, A/P Side step on airex beam with UE support  A/P wt shifts on airex beam with UE support x 10 Rocker board A/P with UE support x 1 min Bending to reach items off of 8'' step --> off of ground level with CGA Supine HS stretch 3 x 30 sec bilat LTR x 10 bilat Resisted clam shells green TB 2 x 10 Bent knee fall outs x 10 bilat   PATIENT EDUCATION: Education details: Updated HEP Person educated: Patient Education method: Explanation, Demonstration, and Handouts Education comprehension: verbalized understanding and returned demonstration  HOME EXERCISE PROGRAM: Access Code: [email or web address] URL: [email or web address] Date: 05/11/2023 Prepared by: Lamarr Price  Exercises - Standing Hip Abduction with Counter Support  - 1 x daily - 7 x weekly - 3 sets - 10 reps - Standing Knee Flexion AROM with Chair Support  - 1 x daily - 7 x weekly - 3 sets - 10 reps - Heel Toe Raises with Counter Support  - 1 x daily - 7 x weekly - 3 sets - 10 reps - Sit to Stand  - 1 x daily - 7 x weekly - 3 sets - 5 reps - Forward Backward Weight Shift with Counter Support  - 1 x daily - 7 x weekly - 3 sets - 10 reps - Clamshell with Resistance  - 1 x daily - 7 x weekly - 3 sets - 10 reps - Supine Bridge  - 1 x daily - 7 x weekly - 3 sets - 10 reps - Woodpeckers Two Legs  - 1 x daily - 7 x weekly - 3 sets - 10 reps - Semi-Tandem Corner Balance With Eyes Open  - 1 x daily - 7 x weekly - 3 sets - 10 reps  GOALS: Goals reviewed with patient? Yes  SHORT TERM GOALS: Target date: 05/09/2023  Pt will be independent with initial HEP Baseline: Goal status: IN PROGRESS  2.  Pt will demo improved LE  strength with 5 x STS <= 17 seconds Baseline:  Goal status: IN PROGRESS  LONG TERM GOALS: Target date: 06/06/2023  Pt will be independent with advanced HEP Baseline:  Goal status: INITIAL  2.  Pt will demo decreased fall risk with DGI >= 16/24 Baseline:  Goal status: INITIAL  3.  Pt will demo improved gait and balance with TUG <= 14 seconds Baseline:  Goal status: INITIAL  4.  Pt will report being able to put golf tee in the ground without falling on 10/10 trials Baseline:  Goal status: INITIAL  5.  Pt will report 50% reduction in stiffness when waking up in the morning Baseline:  Goal status: INITIAL   ASSESSMENT:  CLINICAL IMPRESSION: Added  posterior chain strengthening as pt had decreased strength and balance here. He requires min A for bow and arrow and backward resisted walking and will continue to benefit from strength and balance training  From eval: Patient is a 83 y.o. male who was seen today for physical therapy evaluation and treatment for gait instability and falls. He presents with decreased strength, increased fall risk, impaired balance and gait and will benefit from skilled PT to address deficits and improve functional mobility and reduce risk and frequency of falls.   OBJECTIVE IMPAIRMENTS: Abnormal gait, decreased activity tolerance, decreased balance, and decreased strength.    PLAN:  PT FREQUENCY: 2x/week  PT DURATION: 8 weeks  PLANNED INTERVENTIONS: 97164- PT Re-evaluation, 97110-Therapeutic exercises, 97530- Therapeutic activity, [id number]- Neuromuscular re-education, 97535- Self Care, [id number]- Manual therapy, [id number]- Aquatic Therapy, 97014- Electrical stimulation (unattended), [id number]- Ionotophoresis 4mg /ml Dexamethasone, Patient/Family education, Balance training, Stair training, Taping, Dry Needling, Cryotherapy, and Moist heat  PLAN FOR NEXT SESSION: posterior chain strength and balance, Ankle & hip strengthening. Static and dynamic balance. Update HEP  with more balance  Ambrose Wile, PT 05/16/2023, 10:50 AM

## 2023-05-22 NOTE — Progress Notes (Unsigned)
Patient ID: Tristan Mayer, male   DOB: 04/06/1941, 83 y.o.   MRN: [id number]  Reason for Consult: No chief complaint on file.   Referred by [employer], *  Subjective:     HPI  Tristan Mayer is a 83 y.o. male presenting for leg weakness.  Screening ABI was obtained which demonstrated noncompressible vessels.  He reports he struggles with his balance and just feels tired and his legs feel weak.  He golfs about 3 days a week and afterwards just feels exhausted.  Denies claudication, rest pain or nonhealing wounds.  Past Medical History:  Diagnosis Date   Atrial fibrillation (HCC)    CVA (cerebral vascular accident) (HCC)    Diabetes mellitus (HCC)    GERD (gastroesophageal reflux disease)    Hyperlipidemia    PUD (peptic ulcer disease)    Family History  Problem Relation Age of Onset   Prostate cancer Father 19   Stroke Father    Diabetes Brother    Diabetes Brother    Atrial fibrillation Mother    Past Surgical History:  Procedure Laterality Date   CATARACT EXTRACTION W/ INTRAOCULAR LENS IMPLANT Right     Short Social History:  Social History   Tobacco Use   Smoking status: Some Days    Types: Cigars   Smokeless tobacco: Never   Tobacco comments:    Cigar only when playing golf   Substance Use Topics   Alcohol use: Yes    Comment: Occasional    Allergies  Allergen Reactions   Statins Other (See Comments)    Other reaction(s): Other All statin drugs Leg cramps when he was on atorvastatin and fenofibrate simultaneously. Discussed with his PCP Dr. Gary Fleet on the phone [phone]) All statin drugs Leg cramps when he was on atorvastatin and fenofibrate simultaneously. Discussed with his PCP Dr. Gary Fleet on the phone [phone]) All statin drugs    Diltiazem Other (See Comments)    Chest pain   Eliquis [Apixaban] Other (See Comments)    Feels weak    Lisinopril Other (See Comments)    Weakness   Nsaids Other (See Comments)    Renal function      Current Outpatient Medications  Medication Sig Dispense Refill   Blood Glucose Calibration (ACCU-CHEK GUIDE CONTROL) LIQD      Blood Glucose Monitoring Suppl DEVI 1 each by Does not apply route in the morning, at noon, and at bedtime. May substitute to any manufacturer covered by patient's insurance. 1 each 0   carvedilol (COREG) 3.125 MG tablet TAKE 1 TABLET BY MOUTH TWICE DAILY WITH A MEAL (Patient taking differently: Take 3.125 mg by mouth daily with supper.) 180 tablet 1   Co-Enzyme Q-10 30 MG CAPS Take 400 mg by mouth daily.     Continuous Glucose Sensor (FREESTYLE LIBRE 3 SENSOR) MISC Place 1 sensor on the skin every 14 days. Use to check glucose continuously 6 each PRN   finasteride (PROSCAR) 5 MG tablet TAKE 1 TABLET BY MOUTH AT BEDTIME 90 tablet 0   gabapentin (NEURONTIN) 300 MG capsule TAKE 2 CAPSULES BY MOUTH IN THE MORNING AND 3 BY MOUTH IN THE EVENING 450 capsule 0   losartan (COZAAR) 25 MG tablet Take 1 tablet by mouth once daily 90 tablet 0   metFORMIN (GLUCOPHAGE) 1000 MG tablet TAKE 1 TABLET BY MOUTH TWICE DAILY WITH MEALS 180 tablet 0   OZEMPIC, 1 MG/DOSE, 4 MG/3ML SOPN INJECT 1 MG SUBCUTANEOUSLY ONCE A WEEK 3 mL 0  rivaroxaban (XARELTO) 20 MG TABS tablet TAKE 1 TABLET BY MOUTH ONCE DAILY WITH SUPPER 90 tablet 1   rosuvastatin (CRESTOR) 40 MG tablet TAKE 1 TABLET BY MOUTH AT BEDTIME 90 tablet 0   sertraline (ZOLOFT) 100 MG tablet Take 1 tablet by mouth once daily 90 tablet 0   tamsulosin (FLOMAX) 0.4 MG CAPS capsule Take 1 capsule by mouth twice daily 180 capsule 0   traMADol (ULTRAM) 50 MG tablet TAKE 1 TABLET BY MOUTH EVERY 8 HOURS AS NEEDED 30 tablet 0   TRESIBA FLEXTOUCH 100 UNIT/ML FlexTouch Pen INJECT 40 TO 46 UNITS SUBCUTANEOUSLY ONCE DAILY 24 mL 0   Ultra Thin Lancets 31G MISC      No current facility-administered medications for this visit.    REVIEW OF SYSTEMS  Positive for ***  All other systems were reviewed and are negative     Objective:  Objective    There were no vitals filed for this visit. There is no height or weight on file to calculate BMI.  Physical Exam General: no acute distress Cardiac: hemodynamically stable Pulm: normal work of breathing Abdomen: non-tender, no pulsatile mass*** Neuro: alert, no focal deficit Extremities: no edema, cyanosis or wounds*** Vascular:   Right: ***  Left: ***   Data: ABI +---------+------------------+-----+-----------+--------+  Right   Rt Pressure (mmHg)IndexWaveform   Comment   +---------+------------------+-----+-----------+--------+  Brachial 139                                         +---------+------------------+-----+-----------+--------+  PTA     207               1.43 triphasic            +---------+------------------+-----+-----------+--------+  DP      184               1.27 multiphasic          +---------+------------------+-----+-----------+--------+  Great Toe92                0.63 Normal               +---------+------------------+-----+-----------+--------+   +---------+------------------+-----+---------+-------+  Left    Lt Pressure (mmHg)IndexWaveform Comment  +---------+------------------+-----+---------+-------+  Brachial 145                                      +---------+------------------+-----+---------+-------+  PTA     254               1.75 triphasic         +---------+------------------+-----+---------+-------+  DP      254               1.75 triphasic         +---------+------------------+-----+---------+-------+  Great Toe107               0.74 Normal            +---------+------------------+-----+---------+-------+   CMP reviewed, creatinine 1.69  CBC reviewed     Assessment/Plan:     Tristan Mayer is a 83 y.o. male with PAD.  I explained that he has calcific disease causing noncompressibility in his ABI although he has palpable pulses and toe pressures greater than 90 bilaterally.   I also explained that weakness is unlikely to be related to PAD and he  is essentially asymptomatic since he denies pain with ambulation.  Follow-up as needed     Daria Pastures MD [facility]

## 2023-05-23 ENCOUNTER — Encounter: Payer: Self-pay | Admitting: Vascular Surgery

## 2023-05-23 ENCOUNTER — Ambulatory Visit: Payer: Medicare Other | Admitting: Physical Therapy

## 2023-05-23 ENCOUNTER — Ambulatory Visit: Payer: Medicare Other | Admitting: Vascular Surgery

## 2023-05-23 VITALS — BP 127/63 | HR 58 | Temp 97.9°F | Resp 20 | Ht 70.0 in | Wt 218.0 lb

## 2023-05-23 DIAGNOSIS — I739 Peripheral vascular disease, unspecified: Secondary | ICD-10-CM | POA: Diagnosis not present

## 2023-05-24 NOTE — Therapy (Signed)
OUTPATIENT PHYSICAL THERAPY NEURO TREATMENT NOTE   Patient Name: Tristan Mayer MRN: [id number] DOB:1941/02/18, 83 y.o., male Today's Date: 05/25/2023   PCP: Linford Arnold REFERRING PROVIDER: Linford Arnold  END OF SESSION:  PT End of Session - 05/25/23 1015     Visit Number 8    Number of Visits 16    Date for PT Re-Evaluation 06/06/23    Authorization Type [employer] medicare    Authorization Time Period 16 VISITS ARE APPROVED THROUGH [employer] 04/11/2023-06/06/2023    Authorization - Visit Number 8    Authorization - Number of Visits 16    Progress Note Due on Visit 10    PT Start Time 1016    PT Stop Time 1101    PT Time Calculation (min) 45 min    Activity Tolerance Patient tolerated treatment well               Past Medical History:  Diagnosis Date   Atrial fibrillation (HCC)    CVA (cerebral vascular accident) (HCC)    Diabetes mellitus (HCC)    GERD (gastroesophageal reflux disease)    Hyperlipidemia    PUD (peptic ulcer disease)    Past Surgical History:  Procedure Laterality Date   CATARACT EXTRACTION W/ INTRAOCULAR LENS IMPLANT Right    Patient Active Problem List   Diagnosis Date Noted   Weakness of both lower extremities 04/04/2023   CKD stage G3b/A2, GFR 30-44 and albumin creatinine ratio 30-299 mg/g (HCC) 01/03/2023   Primary osteoarthritis of right hand 07/18/2022   Lumbar spondylosis 05/09/2022   Primary osteoarthritis of left hip 04/13/2022   AV block, 1st degree 07/05/2021   Idiopathic peripheral neuropathy 09/16/2019   BMI 31.0-31.9,adult 03/26/2019   Obesity, Class I, BMI 30.0-34.9 (see actual BMI) 08/22/2018   Aortic atherosclerosis (HCC) 03/12/2018   Primary osteoarthritis of both knees 01/03/2018   BPH (benign prostatic hyperplasia) 07/10/2017   INO (internuclear ophthalmoplegia), left 07/03/2017   Hearing loss 07/03/2017   ED (erectile dysfunction) 07/03/2017   Paroxysmal A-fib (HCC) 12/12/2016   Acute ischemic vertebrobasilar artery brainstem  stroke involving left-sided vessel (HCC) 12/09/2016   Thrombsis of left atrial appendage without antecedent myocardial infarction 12/09/2016   Controlled diabetes mellitus type 2 with complications (HCC) 08/14/2008   Hyperlipidemia associated with type 2 diabetes mellitus (HCC) 05/20/2008   Sleep apnea 07/19/2007   Allergic rhinitis 01/07/2007   Benign essential hypertension 01/07/2007   Gout, unspecified 01/07/2007    ONSET DATE: 04/2023 (MD appt)  REFERRING DIAG: falls, gait instability  THERAPY DIAG:  Muscle weakness (generalized)  Balance disorder  Rationale for Evaluation and Treatment: Rehabilitation  SUBJECTIVE:  SUBJECTIVE STATEMENT: 05/25/2023 pt states he feels about the same overall but does feel his balance has improved some, has tried walking his dog more. Notes it is worst after prolonged sitting/positioning. States he feels okay after PT sessions but does get a little fatigued. HEP has been a bit sporadic per pt.   From eval: Pt states that he starts every morning feeling stiff from his knees to his low back. He states when he wakes up he feels "like I've been beat with a stick". He also states "I wobble all the time". MD suspects balance deficits are due to LE weakness. Pt states after he plays golf he hurts so bad he can barely walk from his truck back into the house. He states he walks his dog 5 minutes at a time to improve endurance. He states he usually falls when bending forward to put his golf tee in the ground or when looking down Pt accompanied by: significant other  PERTINENT HISTORY: CVA 2016  PAIN:  Are you having pain? None today Pain location: both knees and hips Pain description: sore, achey Aggravating factors: standing, walking, golf Relieving factors: meds,  heat  PRECAUTIONS: Fall  RED FLAGS: None   WEIGHT BEARING RESTRICTIONS: No  FALLS: Has patient fallen in last 6 months? Yes. Number of falls multiple. "I fell this morning getting ready"  LIVING ENVIRONMENT: Lives with: lives with their family Lives in: House/apartment Stairs: No Has following equipment at home: Single point cane and Walker - 2 wheeled  PLOF: Independent  PATIENT GOALS: not be stiff in the morning, less pain, improve balance  OBJECTIVE:  Note: Objective measures were completed at Evaluation unless otherwise noted.  COGNITION: Overall cognitive status: Within functional limits for tasks assessed   SENSATION: Light touch: Impaired    LOWER EXTREMITY MMT:    MMT Right Eval Left Eval  Hip flexion 4- 4-  Hip extension    Hip abduction 3+ 3+  Hip adduction    Hip internal rotation    Hip external rotation    Knee flexion 4- 4  Knee extension 4 4  Ankle dorsiflexion    Ankle plantarflexion    Ankle inversion    Ankle eversion    (Blank rows = not tested)   GAIT: Gait pattern: decreased step length- Right, decreased step length- Left, and wide BOS Distance walked: 100' Assistive device utilized: None Level of assistance: Complete Independence Comments: wide BOS, drifts Rt  FUNCTIONAL TESTS:  5 times sit to stand: 20.41 seconds Timed up and go (TUG): 16.15 seconds Dynamic Gait Index: 8/24    [facility] Adult PT Treatment:                                                DATE: 05/25/23 Therapeutic Exercise: STS x8 lowest mat, no UE support, cues for posture STS 5# 2x5 lowest mat Mini lunge onto airex x8 BIL, UE support Bosu TKE seated, x8 BIL LE  Neuromuscular re-ed: Fwd/retro walking weaning UE support CGA 4 laps along counter Tandem (R foot fwd) hinge 2x8 CGA-minA for safety, tactile cues to maintain COM within LOS Dual task practice; gait + low amplitude vertical ball toss, 3 laps w/ CGA-minA tactile cues to limit trunk sway, intermittent  instability towards R but no overt LOB, cues to maintain pace of both gait and ball toss   [facility]  Adult PT Treatment:                                                DATE: 05/16/23 Therapeutic Exercise: STS 2 x 5 Seated dead lift 10# KB x 10 Standing partial dead lift 10# KB x 10 Step ups 6'' 2 x 10 Toe raise x 20 Heel raise x 20 Bow and arrow green TB - requires min A for balance Resisted walking backward, laterally 10# - min A for balance Staggered stance balance 2 x 30 sec bilat Straight leg raises abd/add over 5#KB x 15 bilat   [facility] Adult PT Treatment:                                                DATE: 05/11/2023 Therapeutic Exercise: Standing: Toe raises x20 Heel raises x20 Runner's lunge stretch 4x30" (B) 6" step up/down 2x1 min Hip abd 2x10 (B) Hip ext x10 (B) Marching x 45" Seated: Straight leg raises abd/add over 5#KB --> elevated on stepper x10, x12 (B) STS x10  Side Lying: Straight leg hip abd 2x10 Bent knee ext in abd x10 Supine:  HS & ITB stretches w/strap 2x30" (B)  [facility] Adult PT Treatment:                                                DATE: 05/09/23 Therapeutic Exercise: 5 x STS: 20.28 seconds Heel raises 3 x 10 Runners lunge Stepping over obstacles laterally, A/P Side step on airex beam with UE support  A/P wt shifts on airex beam with UE support x 10 Rocker board A/P with UE support x 1 min Bending to reach items off of 8'' step --> off of ground level with CGA Supine HS stretch 3 x 30 sec bilat LTR x 10 bilat Resisted clam shells green TB 2 x 10 Bent knee fall outs x 10 bilat   PATIENT EDUCATION: Education details: HEP, rationale for interventions Person educated: Patient Education method: Explanation, Demonstration, and Handouts Education comprehension: verbalized understanding and returned demonstration  HOME EXERCISE PROGRAM: Access Code: [email or web address] URL: [email or web address] Date: 05/11/2023 Prepared by: Carlynn Herald  Exercises - Standing Hip Abduction with Counter Support  - 1 x daily - 7 x weekly - 3 sets - 10 reps - Standing Knee Flexion AROM with Chair Support  - 1 x daily - 7 x weekly - 3 sets - 10 reps - Heel Toe Raises with Counter Support  - 1 x daily - 7 x weekly - 3 sets - 10 reps - Sit to Stand  - 1 x daily - 7 x weekly - 3 sets - 5 reps - Forward Backward Weight Shift with Counter Support  - 1 x daily - 7 x weekly - 3 sets - 10 reps - Clamshell with Resistance  - 1 x daily - 7 x weekly - 3 sets - 10 reps - Supine Bridge  - 1 x daily - 7 x weekly - 3 sets - 10 reps - Woodpeckers Two Legs  - 1 x daily - 7 x weekly - 3 sets -  10 reps - Semi-Tandem Corner Balance With Eyes Open  - 1 x daily - 7 x weekly - 3 sets - 10 reps  GOALS: Goals reviewed with patient? Yes  SHORT TERM GOALS: Target date: 05/09/2023  Pt will be independent with initial HEP Baseline: Goal status: IN PROGRESS  2.  Pt will demo improved LE strength with 5 x STS <= 17 seconds Baseline:  Goal status: IN PROGRESS  LONG TERM GOALS: Target date: 06/06/2023  Pt will be independent with advanced HEP Baseline:  Goal status: INITIAL  2.  Pt will demo decreased fall risk with DGI >= 16/24 Baseline:  Goal status: INITIAL  3.  Pt will demo improved gait and balance with TUG <= 14 seconds Baseline:  Goal status: INITIAL  4.  Pt will report being able to put golf tee in the ground without falling on 10/10 trials Baseline:  Goal status: INITIAL  5.  Pt will report 50% reduction in "stiffness" when waking up in the morning Baseline:  Goal status: INITIAL   ASSESSMENT:  CLINICAL IMPRESSION: 05/25/2023 Pt arrives w/o pain, no issues since last session. Noted pt has difficulty with dual tasking (tendency to stop walking while talking), additional practice for this added today with good tolerance. Also adding tandem partial hinge to work on postural stability mimicking challenging tasks (I.e picking up golf ball),  initial instability but improves w/ repetition. CGA-minA throughout when postural stability challenged. No adverse events, tolerates today's activities well. Recommend continuing along current POC in order to address relevant deficits and improve functional tolerance. Pt departs today's session in no acute distress, all voiced questions/concerns addressed appropriately from PT perspective.    From eval: Patient is a 82 y.o. male who was seen today for physical therapy evaluation and treatment for gait instability and falls. He presents with decreased strength, increased fall risk, impaired balance and gait and will benefit from skilled PT to address deficits and improve functional mobility and reduce risk and frequency of falls.   OBJECTIVE IMPAIRMENTS: Abnormal gait, decreased activity tolerance, decreased balance, and decreased strength.    PLAN:  PT FREQUENCY: 2x/week  PT DURATION: 8 weeks  PLANNED INTERVENTIONS: 97164- PT Re-evaluation, 97110-Therapeutic exercises, 97530- Therapeutic activity, [id number]- Neuromuscular re-education, 97535- Self Care, [id number]- Manual therapy, [id number]- Aquatic Therapy, 97014- Electrical stimulation (unattended), [phone]- Ionotophoresis 4mg /ml Dexamethasone, Patient/Family education, Balance training, Stair training, Taping, Dry Needling, Cryotherapy, and Moist heat  PLAN FOR NEXT SESSION: HEP review/update. Continue working on postural stability. Posterior chain strength, pt also reports ongoing history of R knee buckling that may benefit from further quad strengthening.   Ashley Murrain PT, DPT 05/25/2023 11:10 AM

## 2023-05-25 ENCOUNTER — Ambulatory Visit: Payer: Medicare Other | Admitting: Physical Therapy

## 2023-05-25 ENCOUNTER — Encounter: Payer: Self-pay | Admitting: Physical Therapy

## 2023-05-25 DIAGNOSIS — R2689 Other abnormalities of gait and mobility: Secondary | ICD-10-CM

## 2023-05-25 DIAGNOSIS — M6281 Muscle weakness (generalized): Secondary | ICD-10-CM

## 2023-05-25 NOTE — Progress Notes (Deleted)
 HPI: H/O paroxysmal atrial fibrillation. Patient had CVA March 2018 and was treated at [facility] [place].  Cardiac CTA March 2018 showed ejection fraction 49%.  There was moderate stenosis in the LAD, moderate stenosis in the ramus and moderate stenosis in the right coronary artery.  Echocardiogram showed normal LV function.  Cardiac MRI May 2018 showed normal LV function, no significant valvular abnormalities. Abdominal ultrasound May 2019 showed no aneurysm.  ABIs December 2024 noncompressible bilaterally with abnormal toe brachial index on the right and normal left.  Since last seen,   Current Outpatient Medications  Medication Sig Dispense Refill   Blood Glucose Calibration (ACCU-CHEK GUIDE CONTROL) LIQD      Blood Glucose Monitoring Suppl DEVI 1 each by Does not apply route in the morning, at noon, and at bedtime. May substitute to any manufacturer covered by patient's insurance. 1 each 0   carvedilol (COREG) 3.125 MG tablet TAKE 1 TABLET BY MOUTH TWICE DAILY WITH A MEAL (Patient taking differently: Take 3.125 mg by mouth daily with supper.) 180 tablet 1   Co-Enzyme Q-10 30 MG CAPS Take 400 mg by mouth daily.     Continuous Glucose Sensor (FREESTYLE LIBRE 3 SENSOR) MISC Place 1 sensor on the skin every 14 days. Use to check glucose continuously 6 each PRN   finasteride (PROSCAR) 5 MG tablet TAKE 1 TABLET BY MOUTH AT BEDTIME 90 tablet 0   gabapentin (NEURONTIN) 300 MG capsule TAKE 2 CAPSULES BY MOUTH IN THE MORNING AND 3 BY MOUTH IN THE EVENING 450 capsule 0   losartan (COZAAR) 25 MG tablet Take 1 tablet by mouth once daily 90 tablet 0   metFORMIN (GLUCOPHAGE) 1000 MG tablet TAKE 1 TABLET BY MOUTH TWICE DAILY WITH MEALS 180 tablet 0   OZEMPIC, 1 MG/DOSE, 4 MG/3ML SOPN INJECT 1 MG SUBCUTANEOUSLY ONCE A WEEK 3 mL 0   rivaroxaban (XARELTO) 20 MG TABS tablet TAKE 1 TABLET BY MOUTH ONCE DAILY WITH SUPPER 90 tablet 1   rosuvastatin (CRESTOR) 40 MG tablet TAKE 1 TABLET BY MOUTH  AT BEDTIME 90 tablet 0   sertraline (ZOLOFT) 100 MG tablet Take 1 tablet by mouth once daily 90 tablet 0   tamsulosin (FLOMAX) 0.4 MG CAPS capsule Take 1 capsule by mouth twice daily 180 capsule 0   traMADol (ULTRAM) 50 MG tablet TAKE 1 TABLET BY MOUTH EVERY 8 HOURS AS NEEDED 30 tablet 0   TRESIBA FLEXTOUCH 100 UNIT/ML FlexTouch Pen INJECT 40 TO 46 UNITS SUBCUTANEOUSLY ONCE DAILY 24 mL 0   Ultra Thin Lancets 31G MISC      No current facility-administered medications for this visit.     Past Medical History:  Diagnosis Date   Atrial fibrillation (HCC)    CVA (cerebral vascular accident) (HCC)    Diabetes mellitus (HCC)    GERD (gastroesophageal reflux disease)    Hyperlipidemia    PUD (peptic ulcer disease)     Past Surgical History:  Procedure Laterality Date   CATARACT EXTRACTION W/ INTRAOCULAR LENS IMPLANT Right     Social History   Socioeconomic History   Marital status: Married    Spouse name: [name]   Number of children: 3   Years of education: 12th Grade   Highest education level: 12th grade  Occupational History   Occupation: Retired  Tobacco Use   Smoking status: Some Days    Types: Cigars   Smokeless tobacco: Never   Tobacco comments:    Cigar only when playing  golf   Vaping Use   Vaping status: Never Used  Substance and Sexual Activity   Alcohol use: Yes    Comment: Occasional   Drug use: Never   Sexual activity: Not on file  Other Topics Concern   Not on file  Social History Narrative   Lives with his wife. He enjoys playing golf three times a week.   Social Drivers of Corporate investment banker Strain: Low Risk  (01/04/2023)   Overall Financial Resource Strain (CARDIA)    Difficulty of Paying Living Expenses: Not hard at all  Food Insecurity: No Food Insecurity (01/04/2023)   Hunger Vital Sign    Worried About Running Out of Food in the Last Year: Never true    Ran Out of Food in the Last Year: Never true  Transportation Needs: No Transportation  Needs (01/04/2023)   PRAPARE - Administrator, Civil Service (Medical): No    Lack of Transportation (Non-Medical): No  Physical Activity: Sufficiently Active (01/04/2023)   Exercise Vital Sign    Days of Exercise per Week: 3 days    Minutes of Exercise per Session: 150+ min  Stress: No Stress Concern Present (01/04/2023)   [employer] of Occupational Health - Occupational Stress Questionnaire    Feeling of Stress : Not at all  Social Connections: Moderately Integrated (01/04/2023)   Social Connection and Isolation Panel [NHANES]    Frequency of Communication with Friends and Family: More than three times a week    Frequency of Social Gatherings with Friends and Family: Three times a week    Attends Religious Services: More than 4 times per year    Active Member of Clubs or Organizations: No    Attends Banker Meetings: Never    Marital Status: Married  Catering manager Violence: Not At Risk (01/04/2023)   Humiliation, Afraid, Rape, and Kick questionnaire    Fear of Current or Ex-Partner: No    Emotionally Abused: No    Physically Abused: No    Sexually Abused: No    Family History  Problem Relation Age of Onset   Prostate cancer Father 47   Stroke Father    Diabetes Brother    Diabetes Brother    Atrial fibrillation Mother     ROS: no fevers or chills, productive cough, hemoptysis, dysphasia, odynophagia, melena, hematochezia, dysuria, hematuria, rash, seizure activity, orthopnea, PND, pedal edema, claudication. Remaining systems are negative.  Physical Exam: Well-developed well-nourished in no acute distress.  Skin is warm and dry.  HEENT is normal.  Neck is supple.  Chest is clear to auscultation with normal expansion.  Cardiovascular exam is regular rate and rhythm.  Abdominal exam nontender or distended. No masses palpated. Extremities show no edema. neuro grossly intact  ECG- personally reviewed  A/P  1 paroxysmal atrial  fibrillation-patient remains in sinus rhythm.  Continue Xarelto and Cardizem at present dose.  2 history of moderate coronary artery disease-documented on previous coronary CTA.  He is not having chest pain.  Continue statin.  He is not on aspirin given need for Xarelto.  3 hyperlipidemia-continue statin.  Olga Millers, MD

## 2023-05-30 ENCOUNTER — Ambulatory Visit: Payer: Medicare Other | Admitting: Physical Therapy

## 2023-05-30 ENCOUNTER — Encounter: Payer: Self-pay | Admitting: Physical Therapy

## 2023-05-30 DIAGNOSIS — M6281 Muscle weakness (generalized): Secondary | ICD-10-CM | POA: Diagnosis not present

## 2023-05-30 DIAGNOSIS — R2689 Other abnormalities of gait and mobility: Secondary | ICD-10-CM | POA: Diagnosis not present

## 2023-05-30 NOTE — Therapy (Signed)
OUTPATIENT PHYSICAL THERAPY NEURO TREATMENT NOTE   Patient Name: Tristan Mayer MRN: [id number] DOB:09/09/1940, 83 y.o., male Today's Date: 05/30/2023   PCP: Linford Arnold REFERRING PROVIDER: Linford Arnold  END OF SESSION:  PT End of Session - 05/30/23 1056     Visit Number 9    Number of Visits 16    Date for PT Re-Evaluation 06/06/23    Authorization Type [employer] medicare    Authorization Time Period 16 VISITS ARE APPROVED THROUGH [employer] 04/11/2023-06/06/2023    Authorization - Visit Number 9    Authorization - Number of Visits 16    Progress Note Due on Visit 10    PT Start Time 1012    PT Stop Time 1052    PT Time Calculation (min) 40 min    Equipment Utilized During Treatment Gait belt    Activity Tolerance Patient tolerated treatment well    Behavior During Therapy WFL for tasks assessed/performed                Past Medical History:  Diagnosis Date   Atrial fibrillation (HCC)    CVA (cerebral vascular accident) (HCC)    Diabetes mellitus (HCC)    GERD (gastroesophageal reflux disease)    Hyperlipidemia    PUD (peptic ulcer disease)    Past Surgical History:  Procedure Laterality Date   CATARACT EXTRACTION W/ INTRAOCULAR LENS IMPLANT Right    Patient Active Problem List   Diagnosis Date Noted   Weakness of both lower extremities 04/04/2023   CKD stage G3b/A2, GFR 30-44 and albumin creatinine ratio 30-299 mg/g (HCC) 01/03/2023   Primary osteoarthritis of right hand 07/18/2022   Lumbar spondylosis 05/09/2022   Primary osteoarthritis of left hip 04/13/2022   AV block, 1st degree 07/05/2021   Idiopathic peripheral neuropathy 09/16/2019   BMI 31.0-31.9,adult 03/26/2019   Obesity, Class I, BMI 30.0-34.9 (see actual BMI) 08/22/2018   Aortic atherosclerosis (HCC) 03/12/2018   Primary osteoarthritis of both knees 01/03/2018   BPH (benign prostatic hyperplasia) 07/10/2017   INO (internuclear ophthalmoplegia), left 07/03/2017   Hearing loss 07/03/2017   ED  (erectile dysfunction) 07/03/2017   Paroxysmal A-fib (HCC) 12/12/2016   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) 12/09/2016   Thrombsis of left atrial appendage without antecedent myocardial infarction 12/09/2016   Controlled diabetes mellitus type 2 with complications (HCC) 08/14/2008   Hyperlipidemia associated with type 2 diabetes mellitus (HCC) 05/20/2008   Sleep apnea 07/19/2007   Allergic rhinitis 01/07/2007   Benign essential hypertension 01/07/2007   Gout, unspecified 01/07/2007    ONSET DATE: 04/2023 (MD appt)  REFERRING DIAG: falls, gait instability  THERAPY DIAG:  Muscle weakness (generalized)  Balance disorder  Rationale for Evaluation and Treatment: Rehabilitation  SUBJECTIVE:  SUBJECTIVE STATEMENT: 05/30/2023 Pt states he played golf last week and fell on the second hole while reaching down to pick up his ball. No injury and he was able to get up on his own  From eval: Pt states that he starts every morning feeling stiff from his knees to his low back. He states when he wakes up he feels "like I've been beat with a stick". He also states "I wobble all the time". MD suspects balance deficits are due to LE weakness. Pt states after he plays golf he hurts so bad he can barely walk from his truck back into the house. He states he walks his dog 5 minutes at a time to improve endurance. He states he usually falls when bending forward to put his golf tee in the ground or when looking down Pt accompanied by: significant other  PERTINENT HISTORY: CVA 2016  PAIN:  Are you having pain? None today Pain location: both knees and hips Pain description: sore, achey Aggravating factors: standing, walking, golf Relieving factors: meds,  heat  PRECAUTIONS: Fall  RED FLAGS: None   WEIGHT BEARING RESTRICTIONS: No  FALLS: Has patient fallen in last 6 months? Yes. Number of falls multiple. "I fell this morning getting ready"  LIVING ENVIRONMENT: Lives with: lives with their family Lives in: House/apartment Stairs: No Has following equipment at home: Single point cane and Walker - 2 wheeled  PLOF: Independent  PATIENT GOALS: not be stiff in the morning, less pain, improve balance  OBJECTIVE:  Note: Objective measures were completed at Evaluation unless otherwise noted.  COGNITION: Overall cognitive status: Within functional limits for tasks assessed   SENSATION: Light touch: Impaired    LOWER EXTREMITY MMT:    MMT Right Eval Left Eval  Hip flexion 4- 4-  Hip extension    Hip abduction 3+ 3+  Hip adduction    Hip internal rotation    Hip external rotation    Knee flexion 4- 4  Knee extension 4 4  Ankle dorsiflexion    Ankle plantarflexion    Ankle inversion    Ankle eversion    (Blank rows = not tested)   GAIT: Gait pattern: decreased step length- Right, decreased step length- Left, and wide BOS Distance walked: 100' Assistive device utilized: None Level of assistance: Complete Independence Comments: wide BOS, drifts Rt  FUNCTIONAL TESTS:  5 times sit to stand: 20.41 seconds Timed up and go (TUG): 16.15 seconds Dynamic Gait Index: 8/24   [facility] Adult PT Treatment:                                                DATE: 05/30/23 Therapeutic Exercise: STS 2 x 5 (low mat table) Mini Lunge onto bosu x 10 bilat LAQ with 3 sec hold, 2# AW x 10 bilat SLR with hip abd/add over 5# KB, 2# AW bilat Seated march 2# AW 2 x 10 Heel raise x 20 Toe raise x 20  Neuromuscular re-ed: Standing on slant board squat at tap cone 2 x 10 Rocker board A/P light UE support x 90 seconds Dual task gait tossing ball with min A Fwd/bkwd gait min A Tandem stance 2 x 30 seconds - CGA Tandem stance with head turns 2  x 30 sec - CGA   [facility] Adult PT Treatment:  DATE: 05/25/23 Therapeutic Exercise: STS x8 lowest mat, no UE support, cues for posture STS 5# 2x5 lowest mat Mini lunge onto airex x8 BIL, UE support Bosu TKE seated, x8 BIL LE  Neuromuscular re-ed: Fwd/retro walking weaning UE support CGA 4 laps along counter Tandem (R foot fwd) hinge 2x8 CGA-minA for safety, tactile cues to maintain COM within LOS Dual task practice; gait + low amplitude vertical ball toss, 3 laps w/ CGA-minA tactile cues to limit trunk sway, intermittent instability towards R but no overt LOB, cues to maintain pace of both gait and ball toss   [facility] Adult PT Treatment:                                                DATE: 05/16/23 Therapeutic Exercise: STS 2 x 5 Seated dead lift 10# KB x 10 Standing partial dead lift 10# KB x 10 Step ups 6'' 2 x 10 Toe raise x 20 Heel raise x 20 Bow and arrow green TB - requires min A for balance Resisted walking backward, laterally 10# - min A for balance Staggered stance balance 2 x 30 sec bilat Straight leg raises abd/add over 5#KB x 15 bilat    PATIENT EDUCATION: Education details: HEP, rationale for interventions Person educated: Patient Education method: Explanation, Demonstration, and Handouts Education comprehension: verbalized understanding and returned demonstration  HOME EXERCISE PROGRAM: Access Code: [email or web address] URL: [email or web address] Date: 05/11/2023 Prepared by: Carlynn Herald  Exercises - Standing Hip Abduction with Counter Support  - 1 x daily - 7 x weekly - 3 sets - 10 reps - Standing Knee Flexion AROM with Chair Support  - 1 x daily - 7 x weekly - 3 sets - 10 reps - Heel Toe Raises with Counter Support  - 1 x daily - 7 x weekly - 3 sets - 10 reps - Sit to Stand  - 1 x daily - 7 x weekly - 3 sets - 5 reps - Forward Backward Weight Shift with Counter Support  - 1 x daily - 7 x weekly - 3 sets - 10  reps - Clamshell with Resistance  - 1 x daily - 7 x weekly - 3 sets - 10 reps - Supine Bridge  - 1 x daily - 7 x weekly - 3 sets - 10 reps - Woodpeckers Two Legs  - 1 x daily - 7 x weekly - 3 sets - 10 reps - Semi-Tandem Corner Balance With Eyes Open  - 1 x daily - 7 x weekly - 3 sets - 10 reps  GOALS: Goals reviewed with patient? Yes  SHORT TERM GOALS: Target date: 05/09/2023  Pt will be independent with initial HEP Baseline: Goal status: IN PROGRESS  2.  Pt will demo improved LE strength with 5 x STS <= 17 seconds Baseline:  Goal status: IN PROGRESS  LONG TERM GOALS: Target date: 06/06/2023  Pt will be independent with advanced HEP Baseline:  Goal status: INITIAL  2.  Pt will demo decreased fall risk with DGI >= 16/24 Baseline:  Goal status: INITIAL  3.  Pt will demo improved gait and balance with TUG <= 14 seconds Baseline:  Goal status: INITIAL  4.  Pt will report being able to put golf tee in the ground without falling on 10/10 trials Baseline:  Goal status: INITIAL  5.  Pt will report 50% reduction  in "stiffness" when waking up in the morning Baseline:  Goal status: INITIAL   ASSESSMENT:  CLINICAL IMPRESSION: 05/30/2023 pt is limited by Lt hip/groin pain with sit <> stand and mini squats today. Added hip flexor strengthening in seated to progress tolerance to activity. Pt continues to require min A for balance with dual task and dynamic gait activities. He will benefit from continued hip and ankle strengthening and work on ankle strategy to maintain balance  From eval: Patient is a 83 y.o. male who was seen today for physical therapy evaluation and treatment for gait instability and falls. He presents with decreased strength, increased fall risk, impaired balance and gait and will benefit from skilled PT to address deficits and improve functional mobility and reduce risk and frequency of falls.   OBJECTIVE IMPAIRMENTS: Abnormal gait, decreased activity tolerance,  decreased balance, and decreased strength.    PLAN:  PT FREQUENCY: 2x/week  PT DURATION: 8 weeks  PLANNED INTERVENTIONS: 97164- PT Re-evaluation, 97110-Therapeutic exercises, 97530- Therapeutic activity, [id number]- Neuromuscular re-education, 97535- Self Care, 78469- Manual therapy, [id number]- Aquatic Therapy, 97014- Electrical stimulation (unattended), [phone]- Ionotophoresis 4mg /ml Dexamethasone, Patient/Family education, Balance training, Stair training, Taping, Dry Needling, Cryotherapy, and Moist heat  PLAN FOR NEXT SESSION: CHECK GOALS, UPDATE HEP, HEP review/update. Continue working on postural stability. Posterior chain strength, pt also reports ongoing history of R knee buckling that may benefit from further quad strengthening.   Reggy Eye, PT,DPT01/28/2510:57 AM

## 2023-05-31 NOTE — Therapy (Signed)
OUTPATIENT PHYSICAL THERAPY NEURO TREATMENT NOTE   Patient Name: Tristan Mayer MRN: [id number] DOB:[birth year], 83 y.o., male Today's Date: 05/31/2023   PCP: Linford Arnold REFERRING PROVIDER: Linford Arnold  END OF SESSION:       Past Medical History:  Diagnosis Date   Atrial fibrillation (HCC)    CVA (cerebral vascular accident) (HCC)    Diabetes mellitus (HCC)    GERD (gastroesophageal reflux disease)    Hyperlipidemia    PUD (peptic ulcer disease)    Past Surgical History:  Procedure Laterality Date   CATARACT EXTRACTION W/ INTRAOCULAR LENS IMPLANT Right    Patient Active Problem List   Diagnosis Date Noted   Weakness of both lower extremities 04/04/2023   CKD stage G3b/A2, GFR 30-44 and albumin creatinine ratio 30-299 mg/g (HCC) 01/03/2023   Primary osteoarthritis of right hand 07/18/2022   Lumbar spondylosis 05/09/2022   Primary osteoarthritis of left hip 04/13/2022   AV block, 1st degree 07/05/2021   Idiopathic peripheral neuropathy 09/16/2019   BMI 31.0-31.9,adult 03/26/2019   Obesity, Class I, BMI 30.0-34.9 (see actual BMI) 08/22/2018   Aortic atherosclerosis (HCC) 03/12/2018   Primary osteoarthritis of both knees 01/03/2018   BPH (benign prostatic hyperplasia) 07/10/2017   INO (internuclear ophthalmoplegia), left 07/03/2017   Hearing loss 07/03/2017   ED (erectile dysfunction) 07/03/2017   Paroxysmal A-fib (HCC) 12/12/2016   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) 12/09/2016   Thrombsis of left atrial appendage without antecedent myocardial infarction 12/09/2016   Controlled diabetes mellitus type 2 with complications (HCC) 08/14/2008   Hyperlipidemia associated with type 2 diabetes mellitus (HCC) 05/20/2008   Sleep apnea 07/19/2007   Allergic rhinitis 01/07/2007   Benign essential hypertension 01/07/2007   Gout, unspecified 01/07/2007    ONSET DATE: 04/2023 (MD appt)  REFERRING DIAG: falls, gait  instability  THERAPY DIAG:  No diagnosis found.  Rationale for Evaluation and Treatment: Rehabilitation  SUBJECTIVE:                                                                                                                                                                                             SUBJECTIVE STATEMENT: 05/31/2023 ***  *** Pt states he played golf last week and fell on the second hole while reaching down to pick up his ball. No injury and he was able to get up on his own  From eval: Pt states that he starts every morning feeling stiff from his knees to his low back. He states when he wakes up he feels "like I've been beat with a stick". He also states "I wobble all the time". MD suspects  balance deficits are due to LE weakness. Pt states after he plays golf he hurts so bad he can barely walk from his truck back into the house. He states he walks his dog 5 minutes at a time to improve endurance. He states he usually falls when bending forward to put his golf tee in the ground or when looking down Pt accompanied by: significant other  PERTINENT HISTORY: CVA 2016  PAIN:  Are you having pain? None today Pain location: both knees and hips Pain description: sore, achey Aggravating factors: standing, walking, golf Relieving factors: meds, heat  PRECAUTIONS: Fall  RED FLAGS: None   WEIGHT BEARING RESTRICTIONS: No  FALLS: Has patient fallen in last 6 months? Yes. Number of falls multiple. "I fell this morning getting ready"  LIVING ENVIRONMENT: Lives with: lives with their family Lives in: House/apartment Stairs: No Has following equipment at home: Single point cane and Walker - 2 wheeled  PLOF: Independent  PATIENT GOALS: not be stiff in the morning, less pain, improve balance  OBJECTIVE:  Note: Objective measures were completed at Evaluation unless otherwise noted.  COGNITION: Overall cognitive status: Within functional limits for tasks  assessed   SENSATION: Light touch: Impaired    LOWER EXTREMITY MMT:    MMT Right Eval Left Eval  Hip flexion 4- 4-  Hip extension    Hip abduction 3+ 3+  Hip adduction    Hip internal rotation    Hip external rotation    Knee flexion 4- 4  Knee extension 4 4  Ankle dorsiflexion    Ankle plantarflexion    Ankle inversion    Ankle eversion    (Blank rows = not tested)   GAIT: Gait pattern: decreased step length- Right, decreased step length- Left, and wide BOS Distance walked: 100' Assistive device utilized: None Level of assistance: Complete Independence Comments: wide BOS, drifts Rt  FUNCTIONAL TESTS:  5 times sit to stand: 20.41 seconds Timed up and go (TUG): 16.15 seconds Dynamic Gait Index: 8/24   [facility] Adult PT Treatment:                                                DATE: 06/01/23 Therapeutic Exercise: *** Manual Therapy: *** Neuromuscular re-ed: *** Therapeutic Activity: *** Modalities: *** Self Care: [facility] Adult PT Treatment:                                                DATE: 05/30/23 Therapeutic Exercise: STS 2 x 5 (low mat table) Mini Lunge onto bosu x 10 bilat LAQ with 3 sec hold, 2# AW x 10 bilat SLR with hip abd/add over 5# KB, 2# AW bilat Seated march 2# AW 2 x 10 Heel raise x 20 Toe raise x 20  Neuromuscular re-ed: Standing on slant board squat at tap cone 2 x 10 Rocker board A/P light UE support x 90 seconds Dual task gait tossing ball with min A Fwd/bkwd gait min A Tandem stance 2 x 30 seconds - CGA Tandem stance with head turns 2 x 30 sec - CGA   [facility] Adult PT Treatment:  DATE: 05/25/23 Therapeutic Exercise: STS x8 lowest mat, no UE support, cues for posture STS 5# 2x5 lowest mat Mini lunge onto airex x8 BIL, UE support Bosu TKE seated, x8 BIL LE  Neuromuscular re-ed: Fwd/retro walking weaning UE support CGA 4 laps along counter Tandem (R foot fwd) hinge 2x8 CGA-minA  for safety, tactile cues to maintain COM within LOS Dual task practice; gait + low amplitude vertical ball toss, 3 laps w/ CGA-minA tactile cues to limit trunk sway, intermittent instability towards R but no overt LOB, cues to maintain pace of both gait and ball toss   [facility] Adult PT Treatment:                                                DATE: 05/16/23 Therapeutic Exercise: STS 2 x 5 Seated dead lift 10# KB x 10 Standing partial dead lift 10# KB x 10 Step ups 6'' 2 x 10 Toe raise x 20 Heel raise x 20 Bow and arrow green TB - requires min A for balance Resisted walking backward, laterally 10# - min A for balance Staggered stance balance 2 x 30 sec bilat Straight leg raises abd/add over 5#KB x 15 bilat    PATIENT EDUCATION: Education details: HEP, rationale for interventions Person educated: Patient Education method: Explanation, Demonstration, and Handouts Education comprehension: verbalized understanding and returned demonstration  HOME EXERCISE PROGRAM: Access Code: [email or web address] URL: [email or web address] Date: 05/11/2023 Prepared by: Carlynn Herald  Exercises - Standing Hip Abduction with Counter Support  - 1 x daily - 7 x weekly - 3 sets - 10 reps - Standing Knee Flexion AROM with Chair Support  - 1 x daily - 7 x weekly - 3 sets - 10 reps - Heel Toe Raises with Counter Support  - 1 x daily - 7 x weekly - 3 sets - 10 reps - Sit to Stand  - 1 x daily - 7 x weekly - 3 sets - 5 reps - Forward Backward Weight Shift with Counter Support  - 1 x daily - 7 x weekly - 3 sets - 10 reps - Clamshell with Resistance  - 1 x daily - 7 x weekly - 3 sets - 10 reps - Supine Bridge  - 1 x daily - 7 x weekly - 3 sets - 10 reps - Woodpeckers Two Legs  - 1 x daily - 7 x weekly - 3 sets - 10 reps - Semi-Tandem Corner Balance With Eyes Open  - 1 x daily - 7 x weekly - 3 sets - 10 reps  GOALS: Goals reviewed with patient? Yes  SHORT TERM GOALS: Target date: 05/09/2023  Pt will be  independent with initial HEP Baseline: Goal status: IN PROGRESS  2.  Pt will demo improved LE strength with 5 x STS <= 17 seconds Baseline:  Goal status: IN PROGRESS  LONG TERM GOALS: Target date: 06/06/2023  Pt will be independent with advanced HEP Baseline:  Goal status: INITIAL  2.  Pt will demo decreased fall risk with DGI >= 16/24 Baseline:  Goal status: INITIAL  3.  Pt will demo improved gait and balance with TUG <= 14 seconds Baseline:  Goal status: INITIAL  4.  Pt will report being able to put golf tee in the ground without falling on 10/10 trials Baseline:  Goal status: INITIAL  5.  Pt will report 50%  reduction in "stiffness" when waking up in the morning Baseline:  Goal status: INITIAL   ASSESSMENT:  CLINICAL IMPRESSION: 05/31/2023 ***  *** pt is limited by Lt hip/groin pain with sit <> stand and mini squats today. Added hip flexor strengthening in seated to progress tolerance to activity. Pt continues to require min A for balance with dual task and dynamic gait activities. He will benefit from continued hip and ankle strengthening and work on ankle strategy to maintain balance  From eval: Patient is a 83 y.o. male who was seen today for physical therapy evaluation and treatment for gait instability and falls. He presents with decreased strength, increased fall risk, impaired balance and gait and will benefit from skilled PT to address deficits and improve functional mobility and reduce risk and frequency of falls.   OBJECTIVE IMPAIRMENTS: Abnormal gait, decreased activity tolerance, decreased balance, and decreased strength.    PLAN:  PT FREQUENCY: 2x/week  PT DURATION: 8 weeks  PLANNED INTERVENTIONS: 97164- PT Re-evaluation, 97110-Therapeutic exercises, 97530- Therapeutic activity, [id number]- Neuromuscular re-education, 97535- Self Care, 04540- Manual therapy, [id number]- Aquatic Therapy, 97014- Electrical stimulation (unattended), 97033- Ionotophoresis 4mg /ml  Dexamethasone, Patient/Family education, Balance training, Stair training, Taping, Dry Needling, Cryotherapy, and Moist heat  PLAN FOR NEXT SESSION: CHECK GOALS, UPDATE HEP, HEP review/update. Continue working on postural stability. Posterior chain strength, pt also reports ongoing history of R knee buckling that may benefit from further quad strengthening.    Ashley Murrain PT, DPT 05/31/2023 11:30 AM

## 2023-06-01 ENCOUNTER — Encounter: Payer: Self-pay | Admitting: Physical Therapy

## 2023-06-01 ENCOUNTER — Ambulatory Visit: Payer: Medicare Other | Admitting: Physical Therapy

## 2023-06-01 ENCOUNTER — Other Ambulatory Visit: Payer: Self-pay | Admitting: Family Medicine

## 2023-06-01 DIAGNOSIS — R2689 Other abnormalities of gait and mobility: Secondary | ICD-10-CM | POA: Diagnosis not present

## 2023-06-01 DIAGNOSIS — M6281 Muscle weakness (generalized): Secondary | ICD-10-CM

## 2023-06-02 ENCOUNTER — Telehealth: Payer: Self-pay

## 2023-06-02 NOTE — Telephone Encounter (Signed)
He will need to tell us when he needs to refill. We don't have a way to do it automaticlaly unless he wants [place] to send new rx now

## 2023-06-02 NOTE — Telephone Encounter (Signed)
Copied from CRM [id number]. Topic: General - Other >> Jun 02, 2023  3:04 PM Alcus Dad H wrote: Reason for CRM: Patient called because he said his insurance needs all prescriptions moving forward to be written for a 90 day supply + 10 days and they will cover it.

## 2023-06-05 ENCOUNTER — Other Ambulatory Visit: Payer: Self-pay | Admitting: Family Medicine

## 2023-06-05 ENCOUNTER — Ambulatory Visit: Payer: Medicare Other | Admitting: Cardiology

## 2023-06-05 DIAGNOSIS — I10 Essential (primary) hypertension: Secondary | ICD-10-CM

## 2023-06-05 DIAGNOSIS — N401 Enlarged prostate with lower urinary tract symptoms: Secondary | ICD-10-CM

## 2023-06-05 NOTE — Telephone Encounter (Signed)
 Attempted call to patient. Left a voice mail message requesting a return call.

## 2023-06-05 NOTE — Telephone Encounter (Unsigned)
Copied from CRM [id number]. Topic: Clinical - Prescription Issue >> Jun 05, 2023  3:10 PM Eunice Blase wrote: Reason for CRM: Pt returning call stated that insurance said they can extend all medications for 10 days which means at 100 days but PCP must inform the pharmacy. Please call pt [phone].

## 2023-06-06 ENCOUNTER — Encounter: Payer: Medicare Other | Admitting: Physical Therapy

## 2023-06-06 MED ORDER — CARVEDILOL 3.125 MG PO TABS: 3.1250 mg | ORAL_TABLET | Freq: Every day | ORAL | 1 refills | Status: DC

## 2023-06-06 MED ORDER — SERTRALINE HCL 100 MG PO TABS: 100.0000 mg | ORAL_TABLET | Freq: Every day | ORAL | 1 refills | Status: DC

## 2023-06-06 MED ORDER — LOSARTAN POTASSIUM 25 MG PO TABS: 25.0000 mg | ORAL_TABLET | Freq: Every day | ORAL | 1 refills | Status: DC

## 2023-06-06 MED ORDER — FINASTERIDE 5 MG PO TABS: 5.0000 mg | ORAL_TABLET | Freq: Every day | ORAL | 3 refills | Status: AC

## 2023-06-06 NOTE — Telephone Encounter (Signed)
 Meds ordered this encounter  Medications   DISCONTD: carvedilol  (COREG ) 3.125 MG tablet    Sig: Take 1 tablet (3.125 mg total) by mouth daily with supper.    Dispense:  90 tablet    Refill:  0   DISCONTD: sertraline  (ZOLOFT ) 100 MG tablet    Sig: Take 1 tablet by mouth once daily    Dispense:  90 tablet    Refill:  0   DISCONTD: finasteride  (PROSCAR ) 5 MG tablet    Sig: TAKE 1 TABLET BY MOUTH AT BEDTIME    Dispense:  90 tablet    Refill:  0   carvedilol  (COREG ) 3.125 MG tablet    Sig: Take 1 tablet (3.125 mg total) by mouth daily with supper.    Dispense:  100 tablet    Refill:  1   finasteride  (PROSCAR ) 5 MG tablet    Sig: Take 1 tablet (5 mg total) by mouth at bedtime.    Dispense:  100 tablet    Refill:  3   losartan  (COZAAR ) 25 MG tablet    Sig: Take 1 tablet (25 mg total) by mouth daily.    Dispense:  100 tablet    Refill:  1   sertraline  (ZOLOFT ) 100 MG tablet    Sig: Take 1 tablet (100 mg total) by mouth daily.    Dispense:  100 tablet    Refill:  1

## 2023-06-06 NOTE — Addendum Note (Signed)
Addended by: [name] D on: 06/06/2023 03:45 PM   Modules accepted: Orders

## 2023-06-06 NOTE — Telephone Encounter (Signed)
 Copied from CRM [id number]. Topic: Clinical - Prescription Issue >> Jun 05, 2023  3:10 PM Eunice Blase wrote: Reason for CRM: Pt returning call stated that insurance said they can extend all medications for 10 days which means at 100 days but PCP must inform the pharmacy. Please call pt [phone].

## 2023-06-07 ENCOUNTER — Telehealth: Payer: Self-pay

## 2023-06-07 NOTE — Telephone Encounter (Signed)
 Per patient chart.  Provider has sent over refills of carvedilol ,  Finasteride , losartan , sertraline   as refilled for qty #100  yesterday.  Sent patient a Mychart message as above

## 2023-06-07 NOTE — Telephone Encounter (Signed)
 Copied from CRM [id number]. Topic: Clinical - Medication Question >> Jun 07, 2023  3:04 PM Deleta HERO wrote: Reason for CRM: PT returning missed call from Luke, informed the patient the message left by Luke. Patient states he is needing all of his current medications refilled to [facility] in [place].

## 2023-06-07 NOTE — Telephone Encounter (Signed)
 Again attempted to contact patient. Needing to know if he is wanting to send refills now and if so for which medications are refills currently needed. Left a voice mail message requesting a return call.

## 2023-06-08 ENCOUNTER — Ambulatory Visit: Payer: Medicare Other

## 2023-06-09 ENCOUNTER — Other Ambulatory Visit: Payer: Self-pay | Admitting: Family Medicine

## 2023-06-09 ENCOUNTER — Telehealth: Payer: Self-pay | Admitting: Family Medicine

## 2023-06-09 DIAGNOSIS — Z794 Long term (current) use of insulin: Secondary | ICD-10-CM

## 2023-06-09 MED ORDER — OSELTAMIVIR PHOSPHATE 75 MG PO CAPS: 75.0000 mg | ORAL_CAPSULE | Freq: Every day | ORAL | 0 refills | Status: DC

## 2023-06-09 NOTE — Telephone Encounter (Signed)
 Meds ordered this encounter  Medications   oseltamivir  (TAMIFLU ) 75 MG capsule    Sig: Take 1 capsule (75 mg total) by mouth daily.    Dispense:  10 capsule    Refill:  0   Tested positive for influenza A here in our office today.  She would like prophylaxis sent for her husband.

## 2023-06-13 ENCOUNTER — Other Ambulatory Visit: Payer: Self-pay | Admitting: Family Medicine

## 2023-06-13 NOTE — Telephone Encounter (Signed)
Chart shows medications refills already sent as Qty #100 in patient chart.

## 2023-06-14 NOTE — Therapy (Signed)
 OUTPATIENT PHYSICAL THERAPY NEURO TREATMENT   Patient Name: Braxtyn Bojarski MRN: [id number] DOB:May 08, 1940, 83 y.o., male Today's Date: 06/15/2023     PCP: Linford Arnold REFERRING PROVIDER: Linford Arnold  END OF SESSION:  PT End of Session - 06/15/23 1147     Visit Number 11    Number of Visits 16    Date for PT Re-Evaluation 06/22/23    Authorization Type [employer] medicare    Authorization Time Period 16 VISITS ARE APPROVED THROUGH [employer] 04/11/2023-06/06/2023    Authorization - Visit Number 11    Authorization - Number of Visits 16    Progress Note Due on Visit 20    PT Start Time 1148    PT Stop Time 1227    PT Time Calculation (min) 39 min    Activity Tolerance Patient tolerated treatment well                  Past Medical History:  Diagnosis Date   Atrial fibrillation (HCC)    CVA (cerebral vascular accident) (HCC)    Diabetes mellitus (HCC)    GERD (gastroesophageal reflux disease)    Hyperlipidemia    PUD (peptic ulcer disease)    Past Surgical History:  Procedure Laterality Date   CATARACT EXTRACTION W/ INTRAOCULAR LENS IMPLANT Right    Patient Active Problem List   Diagnosis Date Noted   Weakness of both lower extremities 04/04/2023   CKD stage G3b/A2, GFR 30-44 and albumin creatinine ratio 30-299 mg/g (HCC) 01/03/2023   Primary osteoarthritis of right hand 07/18/2022   Lumbar spondylosis 05/09/2022   Primary osteoarthritis of left hip 04/13/2022   AV block, 1st degree 07/05/2021   Idiopathic peripheral neuropathy 09/16/2019   BMI 31.0-31.9,adult 03/26/2019   Obesity, Class I, BMI 30.0-34.9 (see actual BMI) 08/22/2018   Aortic atherosclerosis (HCC) 03/12/2018   Primary osteoarthritis of both knees 01/03/2018   BPH (benign prostatic hyperplasia) 07/10/2017   INO (internuclear ophthalmoplegia), left 07/03/2017   Hearing loss 07/03/2017   ED (erectile dysfunction) 07/03/2017   Paroxysmal A-fib (HCC) 12/12/2016   Acute ischemic vertebrobasilar artery  brainstem stroke involving left-sided vessel (HCC) 12/09/2016   Thrombsis of left atrial appendage without antecedent myocardial infarction 12/09/2016   Controlled diabetes mellitus type 2 with complications (HCC) 08/14/2008   Hyperlipidemia associated with type 2 diabetes mellitus (HCC) 05/20/2008   Sleep apnea 07/19/2007   Allergic rhinitis 01/07/2007   Benign essential hypertension 01/07/2007   Gout, unspecified 01/07/2007    ONSET DATE: 04/2023 (MD appt)  REFERRING DIAG: falls, gait instability  THERAPY DIAG:  Muscle weakness (generalized)  Balance disorder  Rationale for Evaluation and Treatment: Rehabilitation  SUBJECTIVE:  SUBJECTIVE STATEMENT: 06/15/2023 Pt states he hasn't been able to make PT due to illness in family, knees bothering him. Discussed extending dates in POC to accommodate remaining visits, pt states he would like to just continue with the two he has scheduled. Feels his standing tolerance is improving.     From eval: Pt states that he starts every morning feeling stiff from his knees to his low back. He states when he wakes up he feels "like I've been beat with a stick". He also states "I wobble all the time". MD suspects balance deficits are due to LE weakness. Pt states after he plays golf he hurts so bad he can barely walk from his truck back into the house. He states he walks his dog 5 minutes at a time to improve endurance. He states he usually falls when bending forward to put his golf tee in the ground or when looking down Pt accompanied by: significant other  PERTINENT HISTORY: CVA 2016  PAIN:  Are you having pain? 4/10 BIL knees Pain location: both knees and hips Pain description: sore, achey Aggravating factors: standing, walking, golf Relieving factors: meds,  heat  PRECAUTIONS: Fall  RED FLAGS: None   WEIGHT BEARING RESTRICTIONS: No  FALLS: Has patient fallen in last 6 months? Yes. Number of falls multiple. "I fell this morning getting ready"  LIVING ENVIRONMENT: Lives with: lives with their family Lives in: House/apartment Stairs: No Has following equipment at home: Single point cane and Walker - 2 wheeled  PLOF: Independent  PATIENT GOALS: not be stiff in the morning, less pain, improve balance  OBJECTIVE:  Note: Objective measures were completed at Evaluation unless otherwise noted.  COGNITION: Overall cognitive status: Within functional limits for tasks assessed   SENSATION: Light touch: Impaired    LOWER EXTREMITY MMT:    MMT Right Eval Left Eval R/L 06/01/23    Hip flexion 4- 4- 4+/4  Hip extension     Hip abduction 3+ 3+   Hip adduction     Hip internal rotation     Hip external rotation     Knee flexion 4- 4 4+/4+  Knee extension 4 4 4+/4  Ankle dorsiflexion     Ankle plantarflexion     Ankle inversion     Ankle eversion     (Blank rows = not tested)   GAIT: Gait pattern: decreased step length- Right, decreased step length- Left, and wide BOS Distance walked: 100' Assistive device utilized: None Level of assistance: Complete Independence Comments: wide BOS, drifts Rt  FUNCTIONAL TESTS:  5 times sit to stand: 20.41 seconds Timed up and go (TUG): 16.15 seconds Dynamic Gait Index: 8/24  06/01/23: - 5xSTS: 15.79sec w UE support from thighs ; 14.37sec no UE support, knee pain - TUG: 12.80 sec no AD - DGI:   1. Gait Level Surface; 1  2. Change in Gait Speed; 2  3. Gait With Horizontal Head Turns; 1  4. Gait With Vertical Head Turns; 1  5. Gait and Pivot Turn;  1  6. Step Over Obstacle; 1  7. Step Around Obstacles; 2  8. Steps; 1 (self report)   Grading scale: 3 No impairment; 2 Mild impairment/AD use; 1 Moderate impairment; 0 Severe impairment or unable to perform   Total score: 10/24  Cutoff  score for fall risk: </=19/24     [facility] Adult PT Treatment:  DATE: 06/15/23 Therapeutic Exercise: Standing heel raise 2x15 w/ UE support Standing march, 2x8 BIL seated rest 2# LAQ 2x10 BIL HEP update+ education/handout, emphasis on safe setup and fall risk reduction w/ addition of tandem walk at counter w/ UE support  Neuromuscular re-ed: Tandem walk at counter 4 laps CGA Step + weight shift 2x6 BIL  Dual tasking, gait + vertical ball toss with cognitive load (naming objects w/ starting letter) CGA-minA 4 laps  Reactive balance training (stop/go) 1 lap w/ seated rest  Reactive balance training (stop/go/fast/slow) 1 lap w/ seated rest    [facility] Adult PT Treatment:                                                DATE: 06/01/23 Neuromuscular re-ed: Dual tasking vertical ball toss 2x40ft minA Dual tasking vertical ball toss + cognitive load (naming objects, math) 3x59ft minA  Tandem walking along counter x 3 laps CGA-minA 2 LOB to R requiring PT assist  Therapeutic Activity: Education/discussion re: progress with PT, symptom behavior as it affects activity tolerance, PT goals/POC  5xSTS + education  TUG + education  DGI + education      [facility] Adult PT Treatment:                                                DATE: 05/30/23 Therapeutic Exercise: STS 2 x 5 (low mat table) Mini Lunge onto bosu x 10 bilat LAQ with 3 sec hold, 2# AW x 10 bilat SLR with hip abd/add over 5# KB, 2# AW bilat Seated march 2# AW 2 x 10 Heel raise x 20 Toe raise x 20  Neuromuscular re-ed: Standing on slant board squat at tap cone 2 x 10 Rocker board A/P light UE support x 90 seconds Dual task gait tossing ball with min A Fwd/bkwd gait min A Tandem stance 2 x 30 seconds - CGA Tandem stance with head turns 2 x 30 sec - CGA   [facility] Adult PT Treatment:                                                DATE: 05/25/23 Therapeutic Exercise: STS x8 lowest mat, no UE  support, cues for posture STS 5# 2x5 lowest mat Mini lunge onto airex x8 BIL, UE support Bosu TKE seated, x8 BIL LE  Neuromuscular re-ed: Fwd/retro walking weaning UE support CGA 4 laps along counter Tandem (R foot fwd) hinge 2x8 CGA-minA for safety, tactile cues to maintain COM within LOS Dual task practice; gait + low amplitude vertical ball toss, 3 laps w/ CGA-minA tactile cues to limit trunk sway, intermittent instability towards R but no overt LOB, cues to maintain pace of both gait and ball toss   [facility] Adult PT Treatment:                                                DATE: 05/16/23 Therapeutic Exercise: STS 2 x 5 Seated dead  lift 10# KB x 10 Standing partial dead lift 10# KB x 10 Step ups 6'' 2 x 10 Toe raise x 20 Heel raise x 20 Bow and arrow green TB - requires min A for balance Resisted walking backward, laterally 10# - min A for balance Staggered stance balance 2 x 30 sec bilat Straight leg raises abd/add over 5#KB x 15 bilat    PATIENT EDUCATION: Education details: HEP, rationale for interventions Person educated: Patient Education method: Explanation, Demonstration, and Handouts Education comprehension: verbalized understanding and returned demonstration  HOME EXERCISE PROGRAM: Access Code: [email or web address] URL: [email or web address] Date: 06/15/2023 Prepared by: Fransisco Hertz  Exercises - Standing Hip Abduction with Counter Support  - 1 x daily - 7 x weekly - 3 sets - 12 reps - Standing Knee Flexion AROM with Chair Support  - 1 x daily - 7 x weekly - 3 sets - 12 reps - Heel Toe Raises with Counter Support  - 1 x daily - 7 x weekly - 2 sets - 15 reps - Sit to Stand  - 1 x daily - 7 x weekly - 2 sets - 8 reps - Forward Backward Weight Shift with Counter Support  - 1 x daily - 7 x weekly - 3 sets - 10 reps - Clamshell with Resistance  - 1 x daily - 7 x weekly - 3 sets - 10 reps - Supine Bridge  - 1 x daily - 7 x weekly - 3 sets - 10 reps - Woodpeckers Two  Legs  - 1 x daily - 7 x weekly - 3 sets - 10 reps - Semi-Tandem Corner Balance With Eyes Open  - 1 x daily - 7 x weekly - 3 sets - 10 reps - Tandem Walking with Counter Support  - 1 x daily - 7 x weekly - 2 sets - 10 reps  GOALS: Goals reviewed with patient? Yes  SHORT TERM GOALS: Target date: 05/09/2023  Pt will be independent with initial HEP Baseline: 06/01/23: reports intermittent HEP performance 06/15/23: no changes since last session Goal status: ONGOING  2.  Pt will demo improved LE strength with 5 x STS <= 17 seconds Baseline:  06/01/23: 14sec no UE support Goal status: MET  LONG TERM GOALS: Target date: 06/22/2023  (updated 06/01/23)  Pt will be independent with advanced HEP Baseline:  06/01/23: reports intermittent HEP adherence 06/15/23: no changes since last session Goal status: ONGOING  2.  Pt will demo decreased fall risk with DGI >= 16/24 Baseline:  06/01/23: 10/24 Goal status: ONGOING  3.  Pt will demo improved gait and balance with TUG <= 14 seconds Baseline:  06/01/23: 12 sec Goal status: MET  4.  Pt will report being able to put golf tee in the ground without falling on 10/10 trials Baseline:  06/01/23: reports 1 fall while putting tee in ground last game, otherwise no recent falls 06/15/23: no changes since last session, has not played golf Goal status: ONGOING  5.  Pt will report 50% reduction in "stiffness" when waking up in the morning Baseline:  06/01/23: reports stiffness in morning improving 70-80% Goal status: MET   ASSESSMENT:  CLINICAL IMPRESSION: 06/15/2023 Pt arrives w/ 4/10 knee pain BIL, reports improved standing tolerance but overall comparable to last session - gap in care due to family illness and knee pain. Discussed possible recertification today to finish out remaining visits in POC ; pt elects to proceed with two remaining scheduled visits and then probable discharge, which is respected. Today  focusing on dynamic postural stability w/  changes in pace, dual tasking, and reactive balance. No adverse events, tolerates well but does require seated rest primarily due to transient increases in knee pain/fatigue. Recommend continuing along current POC in order to address relevant deficits and improve functional tolerance. Pt departs today's session in no acute distress, all voiced questions/concerns addressed appropriately from PT perspective.     From eval: Patient is a 83 y.o. male who was seen today for physical therapy evaluation and treatment for gait instability and falls. He presents with decreased strength, increased fall risk, impaired balance and gait and will benefit from skilled PT to address deficits and improve functional mobility and reduce risk and frequency of falls.   OBJECTIVE IMPAIRMENTS: Abnormal gait, decreased activity tolerance, decreased balance, and decreased strength.    PLAN: (updated 06/01/23)  PT FREQUENCY: 2x/week   PT DURATION: 3 weeks  PLANNED INTERVENTIONS: 97164- PT Re-evaluation, 97110-Therapeutic exercises, 97530- Therapeutic activity, [id number]- Neuromuscular re-education, 97535- Self Care, [id number]- Manual therapy, [id number]- Aquatic Therapy, 97014- Electrical stimulation (unattended), [id number]- Ionotophoresis 4mg /ml Dexamethasone, Patient/Family education, Balance training, Stair training, Taping, Dry Needling, Cryotherapy, and Moist heat  PLAN FOR NEXT SESSION: Update/review HEP PRN. Continue with postural stability training, emphasis on dual tasking, altered BOS.    Ashley Murrain PT, DPT 06/15/2023 12:31 PM

## 2023-06-15 ENCOUNTER — Encounter: Payer: Self-pay | Admitting: Physical Therapy

## 2023-06-15 ENCOUNTER — Ambulatory Visit: Payer: Medicare Other | Attending: Family Medicine | Admitting: Physical Therapy

## 2023-06-15 DIAGNOSIS — M6281 Muscle weakness (generalized): Secondary | ICD-10-CM | POA: Diagnosis not present

## 2023-06-15 DIAGNOSIS — R2689 Other abnormalities of gait and mobility: Secondary | ICD-10-CM | POA: Diagnosis not present

## 2023-06-20 ENCOUNTER — Ambulatory Visit: Payer: Medicare Other

## 2023-06-20 DIAGNOSIS — R2689 Other abnormalities of gait and mobility: Secondary | ICD-10-CM | POA: Diagnosis not present

## 2023-06-20 DIAGNOSIS — M6281 Muscle weakness (generalized): Secondary | ICD-10-CM | POA: Diagnosis not present

## 2023-06-20 NOTE — Therapy (Addendum)
 OUTPATIENT PHYSICAL THERAPY NEURO TREATMENT + NO VISIT DISCHARGE SUMMARY (see below)    Patient Name: Tristan Mayer MRN: [id number] DOB:[birth year], 83 y.o., male Today's Date: 06/20/2023     PCP: Linford Arnold REFERRING PROVIDER: Linford Arnold  END OF SESSION:  PT End of Session - 06/20/23 0849     Visit Number 12    Number of Visits 16    Date for PT Re-Evaluation 06/22/23    Authorization Type [employer] medicare    Authorization Time Period 16 VISITS ARE APPROVED THROUGH [employer] 04/11/2023-06/06/2023    Authorization - Visit Number 12    Authorization - Number of Visits 16    PT Start Time 0850    PT Stop Time 0930    PT Time Calculation (min) 40 min    Activity Tolerance Patient tolerated treatment well    Behavior During Therapy WFL for tasks assessed/performed            Past Medical History:  Diagnosis Date   Atrial fibrillation (HCC)    CVA (cerebral vascular accident) (HCC)    Diabetes mellitus (HCC)    GERD (gastroesophageal reflux disease)    Hyperlipidemia    PUD (peptic ulcer disease)    Past Surgical History:  Procedure Laterality Date   CATARACT EXTRACTION W/ INTRAOCULAR LENS IMPLANT Right    Patient Active Problem List   Diagnosis Date Noted   Weakness of both lower extremities 04/04/2023   CKD stage G3b/A2, GFR 30-44 and albumin creatinine ratio 30-299 mg/g (HCC) 01/03/2023   Primary osteoarthritis of right hand 07/18/2022   Lumbar spondylosis 05/09/2022   Primary osteoarthritis of left hip 04/13/2022   AV block, 1st degree 07/05/2021   Idiopathic peripheral neuropathy 09/16/2019   BMI 31.0-31.9,adult 03/26/2019   Obesity, Class I, BMI 30.0-34.9 (see actual BMI) 08/22/2018   Aortic atherosclerosis (HCC) 03/12/2018   Primary osteoarthritis of both knees 01/03/2018   BPH (benign prostatic hyperplasia) 07/10/2017   INO (internuclear ophthalmoplegia), left 07/03/2017   Hearing loss 07/03/2017   ED (erectile dysfunction) 07/03/2017   Paroxysmal A-fib  (HCC) 12/12/2016   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) 12/09/2016   Thrombsis of left atrial appendage without antecedent myocardial infarction 12/09/2016   Controlled diabetes mellitus type 2 with complications (HCC) 08/14/2008   Hyperlipidemia associated with type 2 diabetes mellitus (HCC) 05/20/2008   Sleep apnea 07/19/2007   Allergic rhinitis 01/07/2007   Benign essential hypertension 01/07/2007   Gout, unspecified 01/07/2007    ONSET DATE: 04/2023 (MD appt)  REFERRING DIAG: falls, gait instability  THERAPY DIAG:  Muscle weakness (generalized)  Balance disorder  Rationale for Evaluation and Treatment: Rehabilitation  SUBJECTIVE:  SUBJECTIVE STATEMENT: Patient reports his R knee is really bothering him today, states he "still wobbles" but he is not falling as much. Patient states it takes him while to get "loose" in the mornings.   From eval: Pt states that he starts every morning feeling stiff from his knees to his low back. He states when he wakes up he feels "like I've been beat with a stick". He also states "I wobble all the time". MD suspects balance deficits are due to LE weakness. Pt states after he plays golf he hurts so bad he can barely walk from his truck back into the house. He states he walks his dog 5 minutes at a time to improve endurance. He states he usually falls when bending forward to put his golf tee in the ground or when looking down Pt accompanied by: significant other  PERTINENT HISTORY: CVA 2016  PAIN:  Are you having pain? 4/10 BIL knees Pain location: both knees and hips Pain description: sore, achey Aggravating factors: standing, walking, golf Relieving factors: meds, heat  PRECAUTIONS: Fall  RED  FLAGS: None   WEIGHT BEARING RESTRICTIONS: No  FALLS: Has patient fallen in last 6 months? Yes. Number of falls multiple. "I fell this morning getting ready"  LIVING ENVIRONMENT: Lives with: lives with their family Lives in: House/apartment Stairs: No Has following equipment at home: Single point cane and Walker - 2 wheeled  PLOF: Independent  PATIENT GOALS: not be stiff in the morning, less pain, improve balance  OBJECTIVE:  Note: Objective measures were completed at Evaluation unless otherwise noted.  COGNITION: Overall cognitive status: Within functional limits for tasks assessed   SENSATION: Light touch: Impaired    LOWER EXTREMITY MMT:    MMT Right Eval Left Eval R/L 06/01/23    Hip flexion 4- 4- 4+/4  Hip extension     Hip abduction 3+ 3+   Hip adduction     Hip internal rotation     Hip external rotation     Knee flexion 4- 4 4+/4+  Knee extension 4 4 4+/4  Ankle dorsiflexion     Ankle plantarflexion     Ankle inversion     Ankle eversion     (Blank rows = not tested)   GAIT: Gait pattern: decreased step length- Right, decreased step length- Left, and wide BOS Distance walked: 100' Assistive device utilized: None Level of assistance: Complete Independence Comments: wide BOS, drifts Rt  FUNCTIONAL TESTS:  5 times sit to stand: 20.41 seconds Timed up and go (TUG): 16.15 seconds Dynamic Gait Index: 8/24  06/01/23: - 5xSTS: 15.79sec w UE support from thighs ; 14.37sec no UE support, knee pain - TUG: 12.80 sec no AD - DGI:   1. Gait Level Surface; 1  2. Change in Gait Speed; 2  3. Gait With Horizontal Head Turns; 1  4. Gait With Vertical Head Turns; 1  5. Gait and Pivot Turn;  1  6. Step Over Obstacle; 1  7. Step Around Obstacles; 2  8. Steps; 1 (self report)   Grading scale: 3 No impairment; 2 Mild impairment/AD use; 1 Moderate impairment; 0 Severe impairment or unable to perform   Total score: 10/24  Cutoff score for fall risk: </=19/24      [facility] Adult PT Treatment:  DATE: 06/20/2023 Therapeutic Exercise: Seated: Hip abd + Blue TB Knee extension + GTB Therapeutic Activity: Update of LTGs TUG DGI Gait training with [facility]    [facility] Adult PT Treatment:                                                DATE: 06/15/23 Therapeutic Exercise: Standing heel raise 2x15 w/ UE support Standing march, 2x8 BIL seated rest 2# LAQ 2x10 BIL HEP update+ education/handout, emphasis on safe setup and fall risk reduction w/ addition of tandem walk at counter w/ UE support  Neuromuscular re-ed: Tandem walk at counter 4 laps CGA Step + weight shift 2x6 BIL  Dual tasking, gait + vertical ball toss with cognitive load (naming objects w/ starting letter) CGA-minA 4 laps  Reactive balance training (stop/go) 1 lap w/ seated rest  Reactive balance training (stop/go/fast/slow) 1 lap w/ seated rest    [facility] Adult PT Treatment:                                                DATE: 06/01/23 Neuromuscular re-ed: Dual tasking vertical ball toss 2x6ft minA Dual tasking vertical ball toss + cognitive load (naming objects, math) 3x61ft minA  Tandem walking along counter x 3 laps CGA-minA 2 LOB to R requiring PT assist  Therapeutic Activity: Education/discussion re: progress with PT, symptom behavior as it affects activity tolerance, PT goals/POC  5xSTS + education  TUG + education  DGI + education     PATIENT EDUCATION: Education details: HEP, rationale for interventions Person educated: Patient Education method: Explanation, Demonstration, and Handouts Education comprehension: verbalized understanding and returned demonstration  HOME EXERCISE PROGRAM: Access Code: [email or web address] URL: [email or web address] Date: 06/20/2023 Prepared by: Carlynn Herald  Exercises - Standing Hip Abduction with Counter Support  - 1 x daily - 7 x weekly - 3 sets - 12 reps - Standing Knee Flexion AROM with Chair  Support  - 1 x daily - 7 x weekly - 3 sets - 12 reps - Heel Toe Raises with Counter Support  - 1 x daily - 7 x weekly - 2 sets - 15 reps - Sit to Stand  - 1 x daily - 7 x weekly - 2 sets - 8 reps - Forward Backward Weight Shift with Counter Support  - 1 x daily - 7 x weekly - 3 sets - 10 reps - Semi-Tandem Corner Balance With Eyes Open  - 1 x daily - 7 x weekly - 3 sets - 10 reps - Tandem Walking with Counter Support  - 1 x daily - 7 x weekly - 2 sets - 10 reps - Seated Hip Abduction with Resistance  - 1 x daily - 7 x weekly - 3 sets - 10 reps - Sitting Knee Extension with Resistance  - 1 x daily - 7 x weekly - 3 sets - 10 reps  GOALS: Goals reviewed with patient? Yes  SHORT TERM GOALS: Target date: 05/09/2023  Pt will be independent with initial HEP Baseline: 06/01/23: reports intermittent HEP performance 06/15/23: no changes since last session Goal status: ONGOING  2.  Pt will demo improved LE strength with 5 x STS <= 17 seconds Baseline:  06/01/23: 14sec no UE support Goal  status: MET  LONG TERM GOALS: Target date: 06/22/2023  (updated 06/01/23)  Pt will be independent with advanced HEP Baseline:  06/01/23: reports intermittent HEP adherence 06/15/23: no changes since last session 06/20/23: intermittent compliance Goal status: NOT MET  2.  Pt will demo decreased fall risk with DGI >= 16/24 Baseline:  06/01/23: 10/24 06/20/23: 11/24 Goal status: NOT MET  3.  Pt will demo improved gait and balance with TUG <= 14 seconds Baseline:  06/01/23: 12 sec 06/20/23: 13 sec Goal status: MET  4.  Pt will report being able to put golf tee in the ground without falling on 10/10 trials Baseline:  06/01/23: reports 1 fall while putting tee in ground last game, otherwise no recent falls 06/15/23: no changes since last session, has not played golf 06/20/23: no falls, squats to put golf tee in ground, slow pace Goal status: MET  5.  Pt will report 50% reduction in "stiffness" when waking up in  the morning Baseline:  06/01/23: reports stiffness in morning improving 70-80% Goal status: MET   ASSESSMENT:  CLINICAL IMPRESSION: Patient has met LTGs #3, 4, 5 and has not met goals #1 (inconsistent compliance with HEP) and 2. Patient demonstrated moderate unsteadiness while walking with head turns and nods, stepping over obstacles and directional changes (turns). Gait training with SPC provided, as well as discussion with patient on considering using SPC while out in community due to decreased balance and decreased LE stability due to knee pain (R>L). HEP updated for balance and LE strengthening exercises, editing to promote improved compliance.  From eval: Patient is a 83 y.o. male who was seen today for physical therapy evaluation and treatment for gait instability and falls. He presents with decreased strength, increased fall risk, impaired balance and gait and will benefit from skilled PT to address deficits and improve functional mobility and reduce risk and frequency of falls.   OBJECTIVE IMPAIRMENTS: Abnormal gait, decreased activity tolerance, decreased balance, and decreased strength.    PLAN: (updated 06/01/23)  PT FREQUENCY: 2x/week   PT DURATION: 3 weeks  PLANNED INTERVENTIONS: 16109- PT Re-evaluation, 97110-Therapeutic exercises, 97530- Therapeutic activity, 97112- Neuromuscular re-education, 97535- Self Care, 60454- Manual therapy, [phone]- Aquatic Therapy, 97014- Electrical stimulation (unattended), [phone]- Ionotophoresis 4mg /ml Dexamethasone, Patient/Family education, Balance training, Stair training, Taping, Dry Needling, Cryotherapy, and Moist heat  PLAN FOR NEXT SESSION: Discharge next visit  Carlynn Herald, PTA 06/20/2023 9:33 AM     Discharge addendum 08/07/2023  PHYSICAL THERAPY DISCHARGE SUMMARY  Visits from Start of Care: 12  Current functional level related to goals / functional outcomes: Unable to be assessed   Remaining deficits: Unable to be assessed    Education / Equipment: Unable to be assessed  Patient goals were unable to be assessed. Patient is being discharged due to not returning since the last visit.    Ashley Murrain PT, DPT 08/07/2023 1:36 PM

## 2023-06-22 ENCOUNTER — Ambulatory Visit: Payer: Medicare Other

## 2023-06-24 ENCOUNTER — Other Ambulatory Visit: Payer: Self-pay | Admitting: Family Medicine

## 2023-06-26 ENCOUNTER — Other Ambulatory Visit: Payer: Self-pay | Admitting: Family Medicine

## 2023-07-04 ENCOUNTER — Ambulatory Visit: Payer: Medicare Other | Admitting: Family Medicine

## 2023-07-04 NOTE — Progress Notes (Deleted)
   Established Patient Office Visit  Subjective  Patient ID: Tristan Mayer, male    DOB: 1941/03/01  Age: 83 y.o. MRN: [id number]  No chief complaint on file.   HPI  {History (Optional):23778}  ROS    Objective:     There were no vitals taken for this visit. {Vitals History (Optional):23777}  Physical Exam   No results found for any visits on 07/04/23.  {Labs (Optional):23779}  The ASCVD Risk score (Arnett DK, et al., 2019) failed to calculate for the following reasons:   The 2019 ASCVD risk score is only valid for ages 53 to 72   Risk score cannot be calculated because patient has a medical history suggesting prior/existing ASCVD    Assessment & Plan:   Problem List Items Addressed This Visit       Cardiovascular and Mediastinum   Benign essential hypertension     Endocrine   Hyperlipidemia associated with type 2 diabetes mellitus (HCC)   Controlled diabetes mellitus type 2 with complications (HCC) - Primary    No follow-ups on file.    Tristan Gasser, MD

## 2023-07-15 ENCOUNTER — Other Ambulatory Visit: Payer: Self-pay | Admitting: Family Medicine

## 2023-07-26 ENCOUNTER — Other Ambulatory Visit: Payer: Self-pay | Admitting: Family Medicine

## 2023-07-26 DIAGNOSIS — Z794 Long term (current) use of insulin: Secondary | ICD-10-CM

## 2023-07-29 ENCOUNTER — Other Ambulatory Visit: Payer: Self-pay | Admitting: Family Medicine

## 2023-07-29 DIAGNOSIS — I700 Atherosclerosis of aorta: Secondary | ICD-10-CM

## 2023-08-11 ENCOUNTER — Other Ambulatory Visit: Payer: Self-pay | Admitting: Family Medicine

## 2023-08-22 ENCOUNTER — Ambulatory Visit: Admitting: Family Medicine

## 2023-08-22 NOTE — Progress Notes (Deleted)
   Established Patient Office Visit  Subjective  Patient ID: Tristan Mayer, male    DOB: [birth year]  Age: 83 y.o. MRN: [id number]  No chief complaint on file.   HPI  Hypertension- Pt denies chest pain, SOB, dizziness, or heart palpitations.  Taking meds as directed w/o problems.  Denies medication side effects.    Diabetes - no hypoglycemic events. No wounds or sores that are not healing well. No increased thirst or urination. Checking glucose at home. Taking medications as prescribed without any side effects.  F/U Mood - on Effexor.       01/04/2023    8:03 AM 11/22/2022   10:04 AM 01/17/2022    9:41 AM  PHQ9 SCORE ONLY  PHQ-9 Total Score 0 0 0      08/14/2017    1:35 PM 07/03/2017   11:17 AM  GAD 7 : Generalized Anxiety Score  Nervous, Anxious, on Edge 0 0  Control/stop worrying 0 0  Worry too much - different things 0 0  Trouble relaxing 0 0  Restless 0 0  Easily annoyed or irritable 0 0  Afraid - awful might happen 0 0  Total GAD 7 Score 0 0  Anxiety Difficulty Not difficult at all Not difficult at all      {History (Optional):23778}  ROS    Objective:     There were no vitals taken for this visit. {Vitals History (Optional):23777}  Physical Exam   No results found for any visits on 08/22/23.  {Labs (Optional):23779}  The ASCVD Risk score (Arnett DK, et al., 2019) failed to calculate for the following reasons:   The 2019 ASCVD risk score is only valid for ages 83 to 39   Risk score cannot be calculated because patient has a medical history suggesting prior/existing ASCVD    Assessment & Plan:   Problem List Items Addressed This Visit       Cardiovascular and Mediastinum   Benign essential hypertension     Endocrine   Controlled diabetes mellitus type 2 with complications (HCC) - Primary    No follow-ups on file.    Duaine German, MD

## 2023-08-22 NOTE — Assessment & Plan Note (Deleted)
 Due for microalbumin.

## 2023-09-07 ENCOUNTER — Other Ambulatory Visit: Payer: Self-pay | Admitting: Family Medicine

## 2023-09-07 DIAGNOSIS — I700 Atherosclerosis of aorta: Secondary | ICD-10-CM

## 2023-10-04 ENCOUNTER — Other Ambulatory Visit: Payer: Self-pay | Admitting: Family Medicine

## 2023-10-04 DIAGNOSIS — E118 Type 2 diabetes mellitus with unspecified complications: Secondary | ICD-10-CM

## 2023-10-05 ENCOUNTER — Other Ambulatory Visit: Payer: Self-pay | Admitting: Family Medicine

## 2023-10-05 DIAGNOSIS — I700 Atherosclerosis of aorta: Secondary | ICD-10-CM

## 2023-10-05 NOTE — Telephone Encounter (Signed)
 Pls contact the pt to schedule DM appt with Dr. Greer Leak. Thx

## 2023-10-05 NOTE — Telephone Encounter (Signed)
 Patient scheduled for 11/16/23, thanks.

## 2023-10-06 ENCOUNTER — Other Ambulatory Visit: Payer: Self-pay | Admitting: Family Medicine

## 2023-10-06 DIAGNOSIS — I700 Atherosclerosis of aorta: Secondary | ICD-10-CM

## 2023-10-09 ENCOUNTER — Other Ambulatory Visit: Payer: Self-pay | Admitting: Family Medicine

## 2023-10-09 DIAGNOSIS — Z794 Long term (current) use of insulin: Secondary | ICD-10-CM

## 2023-10-09 NOTE — Telephone Encounter (Signed)
 Pls contact the pt to schedule appt with Dr. Greer Leak. Note: Return in about 3 months (around 07/03/2023) for Diabetes follow-up.

## 2023-10-13 DIAGNOSIS — H11001 Unspecified pterygium of right eye: Secondary | ICD-10-CM | POA: Diagnosis not present

## 2023-10-13 DIAGNOSIS — H02834 Dermatochalasis of left upper eyelid: Secondary | ICD-10-CM | POA: Diagnosis not present

## 2023-10-13 DIAGNOSIS — H40013 Open angle with borderline findings, low risk, bilateral: Secondary | ICD-10-CM | POA: Diagnosis not present

## 2023-10-13 DIAGNOSIS — H02831 Dermatochalasis of right upper eyelid: Secondary | ICD-10-CM | POA: Diagnosis not present

## 2023-10-13 DIAGNOSIS — H25812 Combined forms of age-related cataract, left eye: Secondary | ICD-10-CM | POA: Diagnosis not present

## 2023-10-13 DIAGNOSIS — H526 Other disorders of refraction: Secondary | ICD-10-CM | POA: Diagnosis not present

## 2023-10-13 DIAGNOSIS — E119 Type 2 diabetes mellitus without complications: Secondary | ICD-10-CM | POA: Diagnosis not present

## 2023-10-13 LAB — HM DIABETES EYE EXAM

## 2023-10-18 ENCOUNTER — Other Ambulatory Visit: Payer: Self-pay

## 2023-10-18 ENCOUNTER — Other Ambulatory Visit: Payer: Self-pay | Admitting: Family Medicine

## 2023-10-18 DIAGNOSIS — E118 Type 2 diabetes mellitus with unspecified complications: Secondary | ICD-10-CM

## 2023-10-18 NOTE — Telephone Encounter (Signed)
 Copied from CRM [id number]. Topic: Clinical - Medication Question >> Oct 18, 2023  2:30 PM Lenon Radar A wrote: Reason for CRM: Patient called in regarding Insulin  Degludec INJECT 40 TO 46 UNITS SUBCUTANEOUSLY ONCE DAILY. Patient is out of medication and does not have any for tonight. Patient stated he is in need of the insulin . Please contact patient to provide update. Patient can be reached at [phone].

## 2023-10-18 NOTE — Telephone Encounter (Signed)
 Spoke with patient.  States script written for Tresiba  on June 5th was just a 7 day supply.  He is requesting full supply be sent to pharmacy -states he has upcoming appt for DM follow up on 11/16/23 but is out of medication today.  Did you want to fill as 30 or 90 day for patient?

## 2023-10-19 ENCOUNTER — Telehealth: Payer: Self-pay

## 2023-10-19 MED ORDER — TRESIBA FLEXTOUCH 100 UNIT/ML ~~LOC~~ SOPN: PEN_INJECTOR | SUBCUTANEOUS | 3 refills | Status: DC

## 2023-10-19 NOTE — Telephone Encounter (Signed)
 Patient informed that prescription refill is at the pharmacy.

## 2023-10-19 NOTE — Telephone Encounter (Signed)
 Copied from CRM [id number]. Topic: Clinical - Medication Question >> Oct 18, 2023  2:30 PM Lenon Radar A wrote: Reason for CRM: Patient called in regarding Insulin  Degludec INJECT 40 TO 46 UNITS SUBCUTANEOUSLY ONCE DAILY. Patient is out of medication and does not have any for tonight. Patient stated he is in need of the insulin . Please contact patient to provide update. Patient can be reached at [phone]. >> Oct 18, 2023  5:00 PM Fredrica W wrote: Patient called back to check status of request. Needs provider to understand that he does not have any medication left and he needs to take it today. Thank You

## 2023-10-19 NOTE — Telephone Encounter (Signed)
 Patient informed that prescription for Tristan Mayer has been sent to the pharmacy today and he should be able to pick up the medication now.

## 2023-10-24 ENCOUNTER — Other Ambulatory Visit: Payer: Self-pay | Admitting: Family Medicine

## 2023-10-24 DIAGNOSIS — Z794 Long term (current) use of insulin: Secondary | ICD-10-CM

## 2023-10-24 NOTE — Telephone Encounter (Unsigned)
 Copied from CRM [id number]. Topic: Clinical - Medication Refill >> Oct 24, 2023  5:44 PM Kevelyn M wrote: Medication: Herlene 3 plus  Has the patient contacted their pharmacy? Yes (Agent: If no, request that the patient contact the pharmacy for the refill. If patient does not wish to contact the pharmacy document the reason why and proceed with request.) (Agent: If yes, when and what did the pharmacy advise?)  This is the patient's preferred pharmacy:  [facility] [address], [place] - [address] [place] [address] Phone: [phone] Fax: [phone]  [facility] [address], [place] - [address] [place] [address] Phone: [phone] Fax: [phone]   Is this the correct pharmacy for this prescription? Yes If no, delete pharmacy and type the correct one.   Has the prescription been filled recently? No  Is the patient out of the medication? No  Has the patient been seen for an appointment in the last year OR does the patient have an upcoming appointment? Yes  Can we respond through MyChart? Yes  Agent: Please be advised that Rx refills may take up to 3 business days. We ask that you follow-up with your pharmacy.

## 2023-10-25 MED ORDER — FREESTYLE LIBRE 3 SENSOR MISC: 99 refills | Status: DC

## 2023-11-03 ENCOUNTER — Other Ambulatory Visit: Payer: Self-pay | Admitting: Family Medicine

## 2023-11-09 ENCOUNTER — Telehealth: Payer: Self-pay

## 2023-11-09 NOTE — Telephone Encounter (Signed)
 Copied from CRM [id number]. Topic: Clinical - Medical Advice >> Nov 08, 2023  2:20 PM Brittney F wrote: Reason for CRM:   Caller: Sherrilyn  Calling From: [employer]  Reason: Calling to confirm the patient's diagnosis of diabetes, cardiovascular disease or chronic heart failure. Looking to confirm due to the patient being enrolled in Chronic Special Needs Plan.  Deadline for confirmation: 12/31/2023  Reference ID: [id number]  Contact Information:  Phone: [phone]

## 2023-11-09 NOTE — Telephone Encounter (Signed)
 Spoke with representative  States they have already received the confirmation and nothing further needed from our office.

## 2023-11-10 NOTE — Telephone Encounter (Unsigned)
 Copied from CRM [id number]. Topic: General - Call Back - No Documentation >> Nov 09, 2023  2:26 PM Mercer PEDLAR wrote: Reason for CRM: Patient got a call today 11/09/23 with no message left for him.

## 2023-11-10 NOTE — Telephone Encounter (Signed)
 This has been addressed.

## 2023-11-11 ENCOUNTER — Other Ambulatory Visit: Payer: Self-pay | Admitting: Family Medicine

## 2023-11-14 NOTE — Telephone Encounter (Signed)
 Pls contact pt to schedule appt with Dr. Alvan for anticoagulation & DM follow-up. Past due. No additional refill.

## 2023-11-14 NOTE — Telephone Encounter (Signed)
 Pt is already scheduled

## 2023-11-16 ENCOUNTER — Encounter: Payer: Self-pay | Admitting: Family Medicine

## 2023-11-16 ENCOUNTER — Ambulatory Visit (INDEPENDENT_AMBULATORY_CARE_PROVIDER_SITE_OTHER): Admitting: Family Medicine

## 2023-11-16 VITALS — BP 127/77 | HR 59 | Ht 70.0 in | Wt 216.1 lb

## 2023-11-16 DIAGNOSIS — E785 Hyperlipidemia, unspecified: Secondary | ICD-10-CM

## 2023-11-16 DIAGNOSIS — I10 Essential (primary) hypertension: Secondary | ICD-10-CM | POA: Diagnosis not present

## 2023-11-16 DIAGNOSIS — N1832 Chronic kidney disease, stage 3b: Secondary | ICD-10-CM

## 2023-11-16 DIAGNOSIS — E118 Type 2 diabetes mellitus with unspecified complications: Secondary | ICD-10-CM

## 2023-11-16 DIAGNOSIS — Z794 Long term (current) use of insulin: Secondary | ICD-10-CM | POA: Diagnosis not present

## 2023-11-16 DIAGNOSIS — R1310 Dysphagia, unspecified: Secondary | ICD-10-CM

## 2023-11-16 DIAGNOSIS — E1169 Type 2 diabetes mellitus with other specified complication: Secondary | ICD-10-CM | POA: Diagnosis not present

## 2023-11-16 LAB — POCT GLYCOSYLATED HEMOGLOBIN (HGB A1C): Hemoglobin A1C: 6.8 % — AB (ref 4.0–5.6)

## 2023-11-16 MED ORDER — FREESTYLE LIBRE 3 PLUS SENSOR MISC: 3 refills | Status: AC

## 2023-11-16 MED ORDER — TIRZEPATIDE 7.5 MG/0.5ML ~~LOC~~ SOAJ: 7.5000 mg | SUBCUTANEOUS | 0 refills | Status: DC

## 2023-11-16 NOTE — Progress Notes (Signed)
 Established Patient Office Visit  Subjective  Patient ID: Tristan Mayer, male    DOB: 04-Oct-1940  Age: 83 y.o. MRN: [id number]  Chief Complaint  Patient presents with   Diabetes   Hypertension    Pt reports that he stopped taking the Carvedilol  3.125 mg and Losartan  25 mg about 2-3 months ago. He said that he felt bad with the Carvedilol  and that he couldn't tell any difference with taking the Losartan  in his BP because it would be down when he was seen at other offices.     HPI  Diabetes - no hypoglycemic events. No wounds or sores that are not healing well. No increased thirst or urination. Checking glucose at home. Taking medications as prescribed without any side effects.  He uses Tresiba  46 units.  He is on the Ozempic  1 mg and does pretty well with that.  We had tried him on 2 mg dose but he had more nausea and was vomiting intermittently.  He says since going back down on the dose to 1 mg he is actually done well.  In fact he has not vomited at all until yesterday while [place] was [place] the golf [place]. Foot exam performed today.  He needs a new prescription for libre 3+.  They will no longer support the old version so he needs a new one sent for his continuous glucose monitor.  He has it set up on his phone and follows his sugars. He has noticed more recently he is having difficulty swallowing if he eats things like crackers or rice he feels like they get hung.  Also wanted to let me know he is getting his left cataract surgery done in August.  He reports he still struggling with his left hip pain.  He is prescribed tramadol  by our sports med doc he basically just uses it when he golfs or if his pain is more severe.  Tylenol  he says does not really help but he does use his cane because he feels like when his hip is really painful his balance is off more.    ROS    Objective:     BP 127/77   Pulse (!) 59   Ht 5' 10 (1.778 m)   Wt 216 lb 1.3 oz (98 kg)   SpO2 97%   BMI 31.00 kg/m     Physical Exam Vitals and nursing note reviewed.  Constitutional:      Appearance: Normal appearance.  HENT:     Head: Normocephalic and atraumatic.  Eyes:     Conjunctiva/sclera: Conjunctivae normal.  Cardiovascular:     Rate and Rhythm: Normal rate and regular rhythm.  Pulmonary:     Effort: Pulmonary effort is normal.     Breath sounds: Normal breath sounds.  Skin:    General: Skin is warm and dry.  Neurological:     Mental Status: He is alert.  Psychiatric:        Mood and Affect: Mood normal.      Results for orders placed or performed in visit on 11/16/23  POCT HgB A1C  Result Value Ref Range   Hemoglobin A1C 6.8 (A) 4.0 - 5.6 %   HbA1c POC (<> result, manual entry)     HbA1c, POC (prediabetic range)     HbA1c, POC (controlled diabetic range)        The ASCVD Risk score (Arnett DK, et al., 2019) failed to calculate for the following reasons:   The 2019 ASCVD risk score  is only valid for ages 37 to 81   Risk score cannot be calculated because patient has a medical history suggesting prior/existing ASCVD    Assessment & Plan:   Problem List Items Addressed This Visit       Cardiovascular and Mediastinum   Benign essential hypertension   Pressure looks great today.  He has done great with being on a GLP-1 and has lost weight.      Relevant Orders   POCT HgB A1C (Completed)   Urine Microalbumin w/creat. ratio   CMP14+EGFR   Lipid Panel With LDL/HDL Ratio     Endocrine   Hyperlipidemia associated with type 2 diabetes mellitus (HCC) - Primary   Relevant Medications   tirzepatide  (MOUNJARO ) 7.5 MG/0.5ML Pen   Other Relevant Orders   POCT HgB A1C (Completed)   Urine Microalbumin w/creat. ratio   CMP14+EGFR   Lipid Panel With LDL/HDL Ratio   Controlled diabetes mellitus type 2 with complications (HCC)   And really loves to be able to maximize his GLP-1 so that we could reduce his insulin  need.  He is willing to try tirzepatide  so we will try to see  if we can get that covered he still has about 3 weeks of the Ozempic .  A1c looks great today at 6.8, down from 7.1.  Will send her a prescription for new continuous glucometer.  Will also decrease Tresiba  down to 46 units since he has had a few hypoglycemic events since I last saw him.      Relevant Medications   Continuous Glucose Sensor (FREESTYLE LIBRE 3 PLUS SENSOR) MISC   tirzepatide  (MOUNJARO ) 7.5 MG/0.5ML Pen   Other Relevant Orders   POCT HgB A1C (Completed)   Urine Microalbumin w/creat. ratio   CMP14+EGFR   Lipid Panel With LDL/HDL Ratio     Genitourinary   CKD stage G3b/A2, GFR 30-44 and albumin creatinine ratio 30-299 mg/g (HCC)   Team to monitor renal function every 6 months.      Relevant Orders   POCT HgB A1C (Completed)   Urine Microalbumin w/creat. ratio   CMP14+EGFR   Lipid Panel With LDL/HDL Ratio   Other Visit Diagnoses       Dysphagia, unspecified type       Relevant Orders   Ambulatory referral to Gastroenterology      Dysphagia-possible esophageal stricture will refer to GI for further workup and evaluation also will have them address the intermittent vomiting as well.  Return in about 3 months (around 02/16/2024) for Diabetes follow-up.    Dorothyann Byars, MD

## 2023-11-16 NOTE — Assessment & Plan Note (Signed)
 Pressure looks great today.  He has done great with being on a GLP-1 and has lost weight.

## 2023-11-16 NOTE — Patient Instructions (Signed)
 Ok to decease Tresiba  to 44 units daily     I will work on getting the [employer] for you.

## 2023-11-16 NOTE — Assessment & Plan Note (Signed)
 And really loves to be able to maximize his GLP-1 so that we could reduce his insulin  need.  He is willing to try tirzepatide  so we will try to see if we can get that covered he still has about 3 weeks of the Ozempic .  A1c looks great today at 6.8, down from 7.1.  Will send her a prescription for new continuous glucometer.  Will also decrease Tresiba  down to 46 units since he has had a few hypoglycemic events since I last saw him.

## 2023-11-16 NOTE — Assessment & Plan Note (Signed)
Team to monitor renal function every 6 months.

## 2023-11-17 ENCOUNTER — Ambulatory Visit: Payer: Self-pay | Admitting: Family Medicine

## 2023-11-17 LAB — MICROALBUMIN / CREATININE URINE RATIO
Creatinine, Urine: 143 mg/dL
Microalb/Creat Ratio: 205 mg/g{creat} — ABNORMAL HIGH (ref 0–29)
Microalbumin, Urine: 293.6 ug/mL

## 2023-11-17 LAB — CMP14+EGFR
ALT: 16 IU/L (ref 0–44)
AST: 13 IU/L (ref 0–40)
Albumin: 4.4 g/dL (ref 3.7–4.7)
Alkaline Phosphatase: 87 IU/L (ref 44–121)
BUN/Creatinine Ratio: 20 (ref 10–24)
BUN: 32 mg/dL — ABNORMAL HIGH (ref 8–27)
Bilirubin Total: 1 mg/dL (ref 0.0–1.2)
CO2: 22 mmol/L (ref 20–29)
Calcium: 9.5 mg/dL (ref 8.6–10.2)
Chloride: 100 mmol/L (ref 96–106)
Creatinine, Ser: 1.62 mg/dL — ABNORMAL HIGH (ref 0.76–1.27)
Globulin, Total: 2.3 g/dL (ref 1.5–4.5)
Glucose: 175 mg/dL — ABNORMAL HIGH (ref 70–99)
Potassium: 4.3 mmol/L (ref 3.5–5.2)
Sodium: 137 mmol/L (ref 134–144)
Total Protein: 6.7 g/dL (ref 6.0–8.5)
eGFR: 42 mL/min/1.73 — ABNORMAL LOW (ref >59)

## 2023-11-17 LAB — LIPID PANEL WITH LDL/HDL RATIO
Cholesterol, Total: 103 mg/dL (ref 100–199)
HDL: 33 mg/dL — ABNORMAL LOW (ref >39)
LDL Chol Calc (NIH): 47 mg/dL (ref 0–99)
LDL/HDL Ratio: 1.4 ratio (ref 0.0–3.6)
Triglycerides: 128 mg/dL (ref 0–149)
VLDL Cholesterol Cal: 23 mg/dL (ref 5–40)

## 2023-11-17 NOTE — Progress Notes (Signed)
 Hi Craig, kidney function is stable at 1.6.  Cholesterol overall is okay.  You do look just a little dry on the blood work so make sure you are hydrating with water

## 2023-11-22 NOTE — Progress Notes (Signed)
 The urine specimen did show some excess protein in the urine years, gone up and down over the last several years.  Normally when there is excess protein we have you take losartan .  But I know we are holding that currently just because of your blood pressures.  But if they do stabilize then we will probably add that 1 back to help protect your kidneys.  How have your blood pressures been at home this last week or so

## 2023-11-30 ENCOUNTER — Other Ambulatory Visit: Payer: Self-pay | Admitting: Family Medicine

## 2023-12-05 DIAGNOSIS — M4728 Other spondylosis with radiculopathy, sacral and sacrococcygeal region: Secondary | ICD-10-CM | POA: Diagnosis not present

## 2023-12-05 DIAGNOSIS — M9903 Segmental and somatic dysfunction of lumbar region: Secondary | ICD-10-CM | POA: Diagnosis not present

## 2023-12-05 DIAGNOSIS — M25551 Pain in right hip: Secondary | ICD-10-CM | POA: Diagnosis not present

## 2023-12-05 DIAGNOSIS — M9906 Segmental and somatic dysfunction of lower extremity: Secondary | ICD-10-CM | POA: Diagnosis not present

## 2023-12-05 DIAGNOSIS — M4726 Other spondylosis with radiculopathy, lumbar region: Secondary | ICD-10-CM | POA: Diagnosis not present

## 2023-12-05 DIAGNOSIS — M25552 Pain in left hip: Secondary | ICD-10-CM | POA: Diagnosis not present

## 2023-12-05 DIAGNOSIS — M9904 Segmental and somatic dysfunction of sacral region: Secondary | ICD-10-CM | POA: Diagnosis not present

## 2023-12-06 DIAGNOSIS — M25552 Pain in left hip: Secondary | ICD-10-CM | POA: Diagnosis not present

## 2023-12-06 DIAGNOSIS — M9903 Segmental and somatic dysfunction of lumbar region: Secondary | ICD-10-CM | POA: Diagnosis not present

## 2023-12-06 DIAGNOSIS — M9906 Segmental and somatic dysfunction of lower extremity: Secondary | ICD-10-CM | POA: Diagnosis not present

## 2023-12-06 DIAGNOSIS — M4728 Other spondylosis with radiculopathy, sacral and sacrococcygeal region: Secondary | ICD-10-CM | POA: Diagnosis not present

## 2023-12-06 DIAGNOSIS — M25551 Pain in right hip: Secondary | ICD-10-CM | POA: Diagnosis not present

## 2023-12-06 DIAGNOSIS — M9904 Segmental and somatic dysfunction of sacral region: Secondary | ICD-10-CM | POA: Diagnosis not present

## 2023-12-06 DIAGNOSIS — M4726 Other spondylosis with radiculopathy, lumbar region: Secondary | ICD-10-CM | POA: Diagnosis not present

## 2023-12-07 DIAGNOSIS — M9906 Segmental and somatic dysfunction of lower extremity: Secondary | ICD-10-CM | POA: Diagnosis not present

## 2023-12-07 DIAGNOSIS — M4726 Other spondylosis with radiculopathy, lumbar region: Secondary | ICD-10-CM | POA: Diagnosis not present

## 2023-12-07 DIAGNOSIS — M25552 Pain in left hip: Secondary | ICD-10-CM | POA: Diagnosis not present

## 2023-12-07 DIAGNOSIS — M9903 Segmental and somatic dysfunction of lumbar region: Secondary | ICD-10-CM | POA: Diagnosis not present

## 2023-12-07 DIAGNOSIS — M9904 Segmental and somatic dysfunction of sacral region: Secondary | ICD-10-CM | POA: Diagnosis not present

## 2023-12-07 DIAGNOSIS — M25551 Pain in right hip: Secondary | ICD-10-CM | POA: Diagnosis not present

## 2023-12-07 DIAGNOSIS — M4728 Other spondylosis with radiculopathy, sacral and sacrococcygeal region: Secondary | ICD-10-CM | POA: Diagnosis not present

## 2023-12-11 ENCOUNTER — Other Ambulatory Visit: Payer: Self-pay | Admitting: Family Medicine

## 2023-12-11 DIAGNOSIS — M25551 Pain in right hip: Secondary | ICD-10-CM | POA: Diagnosis not present

## 2023-12-11 DIAGNOSIS — M9904 Segmental and somatic dysfunction of sacral region: Secondary | ICD-10-CM | POA: Diagnosis not present

## 2023-12-11 DIAGNOSIS — M9906 Segmental and somatic dysfunction of lower extremity: Secondary | ICD-10-CM | POA: Diagnosis not present

## 2023-12-11 DIAGNOSIS — M4728 Other spondylosis with radiculopathy, sacral and sacrococcygeal region: Secondary | ICD-10-CM | POA: Diagnosis not present

## 2023-12-11 DIAGNOSIS — M9903 Segmental and somatic dysfunction of lumbar region: Secondary | ICD-10-CM | POA: Diagnosis not present

## 2023-12-11 DIAGNOSIS — M25552 Pain in left hip: Secondary | ICD-10-CM | POA: Diagnosis not present

## 2023-12-11 DIAGNOSIS — M4726 Other spondylosis with radiculopathy, lumbar region: Secondary | ICD-10-CM | POA: Diagnosis not present

## 2023-12-12 DIAGNOSIS — M25552 Pain in left hip: Secondary | ICD-10-CM | POA: Diagnosis not present

## 2023-12-12 DIAGNOSIS — M9906 Segmental and somatic dysfunction of lower extremity: Secondary | ICD-10-CM | POA: Diagnosis not present

## 2023-12-12 DIAGNOSIS — M25551 Pain in right hip: Secondary | ICD-10-CM | POA: Diagnosis not present

## 2023-12-12 DIAGNOSIS — M9903 Segmental and somatic dysfunction of lumbar region: Secondary | ICD-10-CM | POA: Diagnosis not present

## 2023-12-12 DIAGNOSIS — M4726 Other spondylosis with radiculopathy, lumbar region: Secondary | ICD-10-CM | POA: Diagnosis not present

## 2023-12-12 DIAGNOSIS — M9904 Segmental and somatic dysfunction of sacral region: Secondary | ICD-10-CM | POA: Diagnosis not present

## 2023-12-12 DIAGNOSIS — M4728 Other spondylosis with radiculopathy, sacral and sacrococcygeal region: Secondary | ICD-10-CM | POA: Diagnosis not present

## 2023-12-13 DIAGNOSIS — M25552 Pain in left hip: Secondary | ICD-10-CM | POA: Diagnosis not present

## 2023-12-13 DIAGNOSIS — M25551 Pain in right hip: Secondary | ICD-10-CM | POA: Diagnosis not present

## 2023-12-13 DIAGNOSIS — M9906 Segmental and somatic dysfunction of lower extremity: Secondary | ICD-10-CM | POA: Diagnosis not present

## 2023-12-13 DIAGNOSIS — M9903 Segmental and somatic dysfunction of lumbar region: Secondary | ICD-10-CM | POA: Diagnosis not present

## 2023-12-13 DIAGNOSIS — M4726 Other spondylosis with radiculopathy, lumbar region: Secondary | ICD-10-CM | POA: Diagnosis not present

## 2023-12-13 DIAGNOSIS — M9904 Segmental and somatic dysfunction of sacral region: Secondary | ICD-10-CM | POA: Diagnosis not present

## 2023-12-13 DIAGNOSIS — M4728 Other spondylosis with radiculopathy, sacral and sacrococcygeal region: Secondary | ICD-10-CM | POA: Diagnosis not present

## 2023-12-14 DIAGNOSIS — M4728 Other spondylosis with radiculopathy, sacral and sacrococcygeal region: Secondary | ICD-10-CM | POA: Diagnosis not present

## 2023-12-14 DIAGNOSIS — M25552 Pain in left hip: Secondary | ICD-10-CM | POA: Diagnosis not present

## 2023-12-14 DIAGNOSIS — M25551 Pain in right hip: Secondary | ICD-10-CM | POA: Diagnosis not present

## 2023-12-14 DIAGNOSIS — M1612 Unilateral primary osteoarthritis, left hip: Secondary | ICD-10-CM | POA: Diagnosis not present

## 2023-12-14 DIAGNOSIS — M9906 Segmental and somatic dysfunction of lower extremity: Secondary | ICD-10-CM | POA: Diagnosis not present

## 2023-12-14 DIAGNOSIS — M4726 Other spondylosis with radiculopathy, lumbar region: Secondary | ICD-10-CM | POA: Diagnosis not present

## 2023-12-14 DIAGNOSIS — M9903 Segmental and somatic dysfunction of lumbar region: Secondary | ICD-10-CM | POA: Diagnosis not present

## 2023-12-14 DIAGNOSIS — M9904 Segmental and somatic dysfunction of sacral region: Secondary | ICD-10-CM | POA: Diagnosis not present

## 2023-12-18 DIAGNOSIS — M9906 Segmental and somatic dysfunction of lower extremity: Secondary | ICD-10-CM | POA: Diagnosis not present

## 2023-12-18 DIAGNOSIS — M4726 Other spondylosis with radiculopathy, lumbar region: Secondary | ICD-10-CM | POA: Diagnosis not present

## 2023-12-18 DIAGNOSIS — M25551 Pain in right hip: Secondary | ICD-10-CM | POA: Diagnosis not present

## 2023-12-18 DIAGNOSIS — M9904 Segmental and somatic dysfunction of sacral region: Secondary | ICD-10-CM | POA: Diagnosis not present

## 2023-12-18 DIAGNOSIS — M4728 Other spondylosis with radiculopathy, sacral and sacrococcygeal region: Secondary | ICD-10-CM | POA: Diagnosis not present

## 2023-12-18 DIAGNOSIS — M9903 Segmental and somatic dysfunction of lumbar region: Secondary | ICD-10-CM | POA: Diagnosis not present

## 2023-12-18 DIAGNOSIS — M25552 Pain in left hip: Secondary | ICD-10-CM | POA: Diagnosis not present

## 2023-12-19 DIAGNOSIS — M9906 Segmental and somatic dysfunction of lower extremity: Secondary | ICD-10-CM | POA: Diagnosis not present

## 2023-12-19 DIAGNOSIS — M4728 Other spondylosis with radiculopathy, sacral and sacrococcygeal region: Secondary | ICD-10-CM | POA: Diagnosis not present

## 2023-12-19 DIAGNOSIS — M4726 Other spondylosis with radiculopathy, lumbar region: Secondary | ICD-10-CM | POA: Diagnosis not present

## 2023-12-19 DIAGNOSIS — M9904 Segmental and somatic dysfunction of sacral region: Secondary | ICD-10-CM | POA: Diagnosis not present

## 2023-12-19 DIAGNOSIS — M25552 Pain in left hip: Secondary | ICD-10-CM | POA: Diagnosis not present

## 2023-12-19 DIAGNOSIS — M9903 Segmental and somatic dysfunction of lumbar region: Secondary | ICD-10-CM | POA: Diagnosis not present

## 2023-12-19 DIAGNOSIS — M25551 Pain in right hip: Secondary | ICD-10-CM | POA: Diagnosis not present

## 2023-12-21 DIAGNOSIS — M4728 Other spondylosis with radiculopathy, sacral and sacrococcygeal region: Secondary | ICD-10-CM | POA: Diagnosis not present

## 2023-12-21 DIAGNOSIS — M25551 Pain in right hip: Secondary | ICD-10-CM | POA: Diagnosis not present

## 2023-12-21 DIAGNOSIS — M25552 Pain in left hip: Secondary | ICD-10-CM | POA: Diagnosis not present

## 2023-12-21 DIAGNOSIS — M4726 Other spondylosis with radiculopathy, lumbar region: Secondary | ICD-10-CM | POA: Diagnosis not present

## 2023-12-21 DIAGNOSIS — M9903 Segmental and somatic dysfunction of lumbar region: Secondary | ICD-10-CM | POA: Diagnosis not present

## 2023-12-21 DIAGNOSIS — M9906 Segmental and somatic dysfunction of lower extremity: Secondary | ICD-10-CM | POA: Diagnosis not present

## 2023-12-21 DIAGNOSIS — M9904 Segmental and somatic dysfunction of sacral region: Secondary | ICD-10-CM | POA: Diagnosis not present

## 2023-12-25 DIAGNOSIS — E113291 Type 2 diabetes mellitus with mild nonproliferative diabetic retinopathy without macular edema, right eye: Secondary | ICD-10-CM | POA: Diagnosis not present

## 2023-12-25 DIAGNOSIS — M25552 Pain in left hip: Secondary | ICD-10-CM | POA: Diagnosis not present

## 2023-12-25 DIAGNOSIS — M4728 Other spondylosis with radiculopathy, sacral and sacrococcygeal region: Secondary | ICD-10-CM | POA: Diagnosis not present

## 2023-12-25 DIAGNOSIS — M9904 Segmental and somatic dysfunction of sacral region: Secondary | ICD-10-CM | POA: Diagnosis not present

## 2023-12-25 DIAGNOSIS — H40013 Open angle with borderline findings, low risk, bilateral: Secondary | ICD-10-CM | POA: Diagnosis not present

## 2023-12-25 DIAGNOSIS — M9906 Segmental and somatic dysfunction of lower extremity: Secondary | ICD-10-CM | POA: Diagnosis not present

## 2023-12-25 DIAGNOSIS — M25551 Pain in right hip: Secondary | ICD-10-CM | POA: Diagnosis not present

## 2023-12-25 DIAGNOSIS — M4726 Other spondylosis with radiculopathy, lumbar region: Secondary | ICD-10-CM | POA: Diagnosis not present

## 2023-12-25 DIAGNOSIS — H52222 Regular astigmatism, left eye: Secondary | ICD-10-CM | POA: Diagnosis not present

## 2023-12-25 DIAGNOSIS — M9903 Segmental and somatic dysfunction of lumbar region: Secondary | ICD-10-CM | POA: Diagnosis not present

## 2023-12-25 DIAGNOSIS — H25812 Combined forms of age-related cataract, left eye: Secondary | ICD-10-CM | POA: Diagnosis not present

## 2023-12-26 DIAGNOSIS — M4728 Other spondylosis with radiculopathy, sacral and sacrococcygeal region: Secondary | ICD-10-CM | POA: Diagnosis not present

## 2023-12-26 DIAGNOSIS — M9906 Segmental and somatic dysfunction of lower extremity: Secondary | ICD-10-CM | POA: Diagnosis not present

## 2023-12-26 DIAGNOSIS — M25551 Pain in right hip: Secondary | ICD-10-CM | POA: Diagnosis not present

## 2023-12-26 DIAGNOSIS — M9903 Segmental and somatic dysfunction of lumbar region: Secondary | ICD-10-CM | POA: Diagnosis not present

## 2023-12-26 DIAGNOSIS — M25552 Pain in left hip: Secondary | ICD-10-CM | POA: Diagnosis not present

## 2023-12-26 DIAGNOSIS — M4726 Other spondylosis with radiculopathy, lumbar region: Secondary | ICD-10-CM | POA: Diagnosis not present

## 2023-12-26 DIAGNOSIS — M9904 Segmental and somatic dysfunction of sacral region: Secondary | ICD-10-CM | POA: Diagnosis not present

## 2023-12-27 DIAGNOSIS — M4728 Other spondylosis with radiculopathy, sacral and sacrococcygeal region: Secondary | ICD-10-CM | POA: Diagnosis not present

## 2023-12-27 DIAGNOSIS — M9904 Segmental and somatic dysfunction of sacral region: Secondary | ICD-10-CM | POA: Diagnosis not present

## 2023-12-27 DIAGNOSIS — M9903 Segmental and somatic dysfunction of lumbar region: Secondary | ICD-10-CM | POA: Diagnosis not present

## 2023-12-27 DIAGNOSIS — M9906 Segmental and somatic dysfunction of lower extremity: Secondary | ICD-10-CM | POA: Diagnosis not present

## 2023-12-27 DIAGNOSIS — M4726 Other spondylosis with radiculopathy, lumbar region: Secondary | ICD-10-CM | POA: Diagnosis not present

## 2023-12-27 DIAGNOSIS — M25551 Pain in right hip: Secondary | ICD-10-CM | POA: Diagnosis not present

## 2023-12-27 DIAGNOSIS — M25552 Pain in left hip: Secondary | ICD-10-CM | POA: Diagnosis not present

## 2024-01-02 ENCOUNTER — Encounter: Payer: Self-pay | Admitting: Sports Medicine

## 2024-01-02 ENCOUNTER — Other Ambulatory Visit: Payer: Self-pay | Admitting: Family Medicine

## 2024-01-02 DIAGNOSIS — Z794 Long term (current) use of insulin: Secondary | ICD-10-CM

## 2024-01-04 DIAGNOSIS — M109 Gout, unspecified: Secondary | ICD-10-CM | POA: Diagnosis not present

## 2024-01-04 DIAGNOSIS — Z79899 Other long term (current) drug therapy: Secondary | ICD-10-CM | POA: Diagnosis not present

## 2024-01-04 DIAGNOSIS — H2181 Floppy iris syndrome: Secondary | ICD-10-CM | POA: Diagnosis not present

## 2024-01-04 DIAGNOSIS — E1136 Type 2 diabetes mellitus with diabetic cataract: Secondary | ICD-10-CM | POA: Diagnosis not present

## 2024-01-04 DIAGNOSIS — H25812 Combined forms of age-related cataract, left eye: Secondary | ICD-10-CM | POA: Diagnosis not present

## 2024-01-04 DIAGNOSIS — E785 Hyperlipidemia, unspecified: Secondary | ICD-10-CM | POA: Diagnosis not present

## 2024-01-04 DIAGNOSIS — I10 Essential (primary) hypertension: Secondary | ICD-10-CM | POA: Diagnosis not present

## 2024-01-04 DIAGNOSIS — H5703 Miosis: Secondary | ICD-10-CM | POA: Diagnosis not present

## 2024-01-04 DIAGNOSIS — I4891 Unspecified atrial fibrillation: Secondary | ICD-10-CM | POA: Diagnosis not present

## 2024-01-04 DIAGNOSIS — E119 Type 2 diabetes mellitus without complications: Secondary | ICD-10-CM | POA: Diagnosis not present

## 2024-01-04 HISTORY — PX: CATARACT EXTRACTION W/ INTRAOCULAR LENS IMPLANT: SHX1309

## 2024-01-08 ENCOUNTER — Encounter: Payer: Self-pay | Admitting: Family Medicine

## 2024-01-09 ENCOUNTER — Ambulatory Visit: Payer: Medicare Other

## 2024-01-09 VITALS — Ht 70.0 in | Wt 213.0 lb

## 2024-01-09 DIAGNOSIS — Z0000 Encounter for general adult medical examination without abnormal findings: Secondary | ICD-10-CM

## 2024-01-09 NOTE — Patient Instructions (Signed)
 Tristan Mayer , Thank you for taking time to come for your Medicare Wellness Visit. I appreciate your ongoing commitment to your health goals. Please review the following plan we discussed and let me know if I can assist you in the future.   These are the goals we discussed:  Goals       Medication Management      Patient Goals/Self-Care Activities Over the next 30 days, patient will:  take medications as prescribed, check glucose daily AM and several times per week in afternoons, document, and provide at future appointments, and collaborate with provider on medication access solutions  Follow Up Plan: Telephone follow up appointment with care management team member scheduled for:  4 months       Patient Stated (pt-stated)      07/27/2020 AWV Goal: Diabetes Management  Patient will maintain an A1C level below 8.0 Patient will not develop any diabetic foot complications Patient will not experience any hypoglycemic episodes over the next 3 months Patient will notify our office of any CBG readings outside of the provider recommended range by calling [phone] Patient will adhere to provider recommendations for diabetes management  Patient Self Management Activities take all medications as prescribed and report any negative side effects monitor and record blood sugar readings as directed adhere to a low carbohydrate diet that incorporates lean proteins, vegetables, whole grains, low glycemic fruits check feet daily noting any sores, cracks, injuries, or callous formations see PCP or podiatrist if he notices any changes in his legs, feet, or toenails Patient will visit PCP and have an A1C level checked every 3 to 6 months as directed  have a yearly eye exam to monitor for vascular changes associated with diabetes and will request that the report be sent to his pcp.  consult with his PCP regarding any changes in his health or new or worsening symptoms       Patient Stated (pt-stated)       12/29/2021 AWV Goal: Diabetes Management  Patient will maintain an A1C level below 7.5 Patient will not develop any diabetic foot complications Patient will not experience any hypoglycemic episodes over the next 3 months Patient will notify our office of any CBG readings outside of the provider recommended range by calling [phone] Patient will adhere to provider recommendations for diabetes management  Patient Self Management Activities take all medications as prescribed and report any negative side effects monitor and record blood sugar readings as directed adhere to a low carbohydrate diet that incorporates lean proteins, vegetables, whole grains, low glycemic fruits check feet daily noting any sores, cracks, injuries, or callous formations see PCP or podiatrist if he notices any changes in his legs, feet, or toenails Patient will visit PCP and have an A1C level checked every 3 to 6 months as directed  have a yearly eye exam to monitor for vascular changes associated with diabetes and will request that the report be sent to his pcp.  consult with his PCP regarding any changes in his health or new or worsening symptoms       Patient Stated (pt-stated)      Patient stated that he would like to maintain his weight loss.      Patient Stated      Patient states he would like to walk better.         This is a list of the screening recommended for you and due dates:  Health Maintenance  Topic Date Due   Zoster (Shingles)  Vaccine (1 of 2) Never done   DTaP/Tdap/Td vaccine (2 - Td or Tdap) 05/03/2019   Flu Shot  12/01/2023   COVID-19 Vaccine (3 - 2025-26 season) 01/01/2024   Hemoglobin A1C  05/18/2024   Eye exam for diabetics  10/12/2024   Yearly kidney function blood test for diabetes  11/15/2024   Yearly kidney health urinalysis for diabetes  11/15/2024   Complete foot exam   11/15/2024   Medicare Annual Wellness Visit  01/08/2025   Pneumococcal Vaccine for age over 66   Completed   HPV Vaccine  Aged Out   Meningitis B Vaccine  Aged Out   Hepatitis C Screening  Discontinued

## 2024-01-09 NOTE — Progress Notes (Signed)
 Subjective:   Cope Marte is a 83 y.o. male who presents for Medicare Annual/Subsequent preventive examination.  Visit Complete: Virtual I connected with  Fairy Molt on 01/09/24 by a audio enabled telemedicine application and verified that I am speaking with the correct person using two identifiers.  Patient Location: Home  Provider Location: Office/Clinic  I discussed the limitations of evaluation and management by telemedicine. The patient expressed understanding and agreed to proceed.  Vital Signs: Because this visit was a virtual/telehealth visit, some criteria may be missing or patient reported. Any vitals not documented were not able to be obtained and vitals that have been documented are patient reported.  Patient Medicare AWV questionnaire was completed by the patient on n/a; I have confirmed that all information answered by patient is correct and no changes since this date.  Cardiac Risk Factors include: male gender;advanced age (>72men, >10 women);diabetes mellitus;obesity (BMI >30kg/m2);smoking/ tobacco exposure;dyslipidemia;family history of premature cardiovascular disease     Objective:    Today's Vitals   01/09/24 0815  Weight: 213 lb (96.6 kg)  Height: 5' 10 (1.778 m)   Body mass index is 30.56 kg/m.     01/09/2024    8:23 AM 01/04/2023    8:02 AM 12/29/2021    8:08 AM 07/27/2020    9:18 AM 01/08/2018    2:06 PM 07/03/2017   11:18 AM  Advanced Directives  Does Patient Have a Medical Advance Directive? Yes Yes Yes Yes Yes  Yes   Type of Estate agent of [place];Living will Living will;Healthcare Power of Attorney Living will Healthcare Power of [place];Living will Healthcare Power of [place];Living will Healthcare Power of [place];Living will  Does patient want to make changes to medical advance directive? No - Patient declined No - Patient declined No - Patient declined No - Patient declined  No - Patient declined   Copy of Healthcare  Power of Attorney in Chart?  No - copy requested  No - copy requested Yes  No - copy requested      Data saved with a previous flowsheet row definition    Current Medications (verified) Outpatient Encounter Medications as of 01/09/2024  Medication Sig   Co-Enzyme Q-10 30 MG CAPS Take 400 mg by mouth daily.   Continuous Glucose Sensor (FREESTYLE LIBRE 3 PLUS SENSOR) MISC Change sensor every 15 days.   finasteride  (PROSCAR ) 5 MG tablet Take 1 tablet (5 mg total) by mouth at bedtime.   gabapentin  (NEURONTIN ) 300 MG capsule TAKE 2 CAPSULES BY MOUTH IN THE MORNING AND 3 IN THE EVENING   losartan  (COZAAR ) 25 MG tablet Take 1 tablet (25 mg total) by mouth daily.   metFORMIN  (GLUCOPHAGE ) 1000 MG tablet TAKE 1 TABLET BY MOUTH TWICE DAILY WITH MEALS   rosuvastatin  (CRESTOR ) 40 MG tablet TAKE 1 TABLET BY MOUTH AT BEDTIME   sertraline  (ZOLOFT ) 100 MG tablet Take 1 tablet (100 mg total) by mouth daily.   tamsulosin  (FLOMAX ) 0.4 MG CAPS capsule Take 1 capsule by mouth twice daily   tirzepatide  (MOUNJARO ) 7.5 MG/0.5ML Pen Inject 7.5 mg into the skin once a week.   TRESIBA  FLEXTOUCH 100 UNIT/ML FlexTouch Pen INJECT 40 TO 46 UNITS SUBCUTANEOUSLY ONCE DAILY   XARELTO  20 MG TABS tablet TAKE 1 TABLET BY MOUTH ONCE DAILY WITH SUPPER . APPOINTMENT REQUIRED FOR FUTURE REFILLS   carvedilol  (COREG ) 3.125 MG tablet Take 1 tablet (3.125 mg total) by mouth daily with supper. (Patient not taking: Reported on 01/09/2024)   traMADol  (ULTRAM ) 50  MG tablet TAKE 1 TABLET BY MOUTH EVERY 8 HOURS AS NEEDED (Patient not taking: Reported on 01/09/2024)   No facility-administered encounter medications on file as of 01/09/2024.    Allergies (verified) Statins, Diltiazem , Eliquis [apixaban], Lisinopril , and Nsaids   History: Past Medical History:  Diagnosis Date   Atrial fibrillation (HCC)    CVA (cerebral vascular accident) (HCC)    Diabetes mellitus (HCC)    GERD (gastroesophageal reflux disease)    Hyperlipidemia    PUD  (peptic ulcer disease)    Past Surgical History:  Procedure Laterality Date   CATARACT EXTRACTION W/ INTRAOCULAR LENS IMPLANT Right    CATARACT EXTRACTION W/ INTRAOCULAR LENS IMPLANT Left 01/04/2024   Family History  Problem Relation Age of Onset   Prostate cancer Father 2   Stroke Father    Diabetes Brother    Diabetes Brother    Atrial fibrillation Mother    Social History   Socioeconomic History   Marital status: Married    Spouse name: [name]   Number of children: 3   Years of education: 12th Grade   Highest education level: 12th grade  Occupational History   Occupation: Retired  Tobacco Use   Smoking status: Some Days    Types: Cigars   Smokeless tobacco: Never   Tobacco comments:    Cigar only when playing golf   Vaping Use   Vaping status: Never Used  Substance and Sexual Activity   Alcohol use: Yes    Comment: Occasional   Drug use: Never   Sexual activity: Not on file  Other Topics Concern   Not on file  Social History Narrative   Lives with his wife. He enjoys playing golf three times a week.   Social Drivers of Corporate investment banker Strain: Low Risk  (01/09/2024)   Overall Financial Resource Strain (CARDIA)    Difficulty of Paying Living Expenses: Not hard at all  Food Insecurity: No Food Insecurity (01/09/2024)   Hunger Vital Sign    Worried About Running Out of Food in the Last Year: Never true    Ran Out of Food in the Last Year: Never true  Transportation Needs: No Transportation Needs (01/09/2024)   PRAPARE - Administrator, Civil Service (Medical): No    Lack of Transportation (Non-Medical): No  Physical Activity: Sufficiently Active (01/09/2024)   Exercise Vital Sign    Days of Exercise per Week: 3 days    Minutes of Exercise per Session: 150+ min  Stress: No Stress Concern Present (01/09/2024)   [employer] of Occupational Health - Occupational Stress Questionnaire    Feeling of Stress: Not at all  Social Connections:  Socially Integrated (01/09/2024)   Social Connection and Isolation Panel    Frequency of Communication with Friends and Family: Twice a week    Frequency of Social Gatherings with Friends and Family: Once a week    Attends Religious Services: More than 4 times per year    Active Member of [employer] or Organizations: Yes    Attends Engineer, structural: More than 4 times per year    Marital Status: Married    Tobacco Counseling Ready to quit: Not Answered Counseling given: Not Answered Tobacco comments: Cigar only when playing golf    Clinical Intake:  Pre-visit preparation completed: Yes  Pain : No/denies pain     BMI - recorded: 30.56 Nutritional Status: BMI > 30  Obese Nutritional Risks: None Diabetes: Yes CBG done?: No Did pt.  bring in CBG monitor from home?: No  How often do you need to have someone help you when you read instructions, pamphlets, or other written materials from your doctor or pharmacy?: 1 - Never What is the last grade level you completed in school?: 12  Interpreter Needed?: No      Activities of Daily Living    01/09/2024    8:17 AM  In your present state of health, do you have any difficulty performing the following activities:  Hearing? 1  Comment Hearing aids  Vision? 0  Difficulty concentrating or making decisions? 0  Walking or climbing stairs? 1  Dressing or bathing? 0  Doing errands, shopping? 0  Preparing Food and eating ? N  Using the Toilet? N  In the past six months, have you accidently leaked urine? N  Do you have problems with loss of bowel control? N  Managing your Medications? N  Managing your Finances? N  Housekeeping or managing your Housekeeping? N    Patient Care Team: Alvan Dorothyann BIRCH, MD as PCP - General (Family Medicine) Gennie Dover, MD as Referring Physician (Ophthalmology)  Indicate any recent Medical Services you may have received from other than [facility] providers in the past year (date may be  approximate).     Assessment:   This is a routine wellness examination for Mahir.  Hearing/Vision screen No results found.   Goals Addressed             This Visit's Progress    Patient Stated       Patient states he would like to walk better.        Depression Screen    01/09/2024    8:22 AM 11/16/2023    3:28 PM 01/04/2023    8:03 AM 11/22/2022   10:04 AM 01/17/2022    9:41 AM 12/29/2021    8:09 AM 10/05/2021    3:50 PM  PHQ 2/9 Scores  PHQ - 2 Score 0 0 0 0 0 0 0  PHQ- 9 Score  2         Fall Risk    01/09/2024    8:24 AM 11/16/2023    3:28 PM 01/04/2023    8:03 AM 11/22/2022   10:10 AM 11/22/2022   10:04 AM  Fall Risk   Falls in the past year? 1 1 1 1  0  Number falls in past yr: 1 1 1 1  0  Injury with Fall? 0 0 0 0 0  Risk for fall due to : History of fall(s) History of fall(s) History of fall(s) History of fall(s) No Fall Risks  Follow up Falls evaluation completed Falls evaluation completed Falls evaluation completed;Education provided;Falls prevention discussed Falls evaluation completed Falls evaluation completed    MEDICARE RISK AT HOME: Medicare Risk at Home Any stairs in or around the home?: No Home free of loose throw rugs in walkways, pet beds, electrical cords, etc?: Yes Adequate lighting in your home to reduce risk of falls?: Yes Life alert?: No Use of a cane, walker or w/c?: Yes Grab bars in the bathroom?: Yes Shower chair or bench in shower?: Yes Elevated toilet seat or a handicapped toilet?: Yes  TIMED UP AND GO:  Was the test performed?  No    Cognitive Function:        01/09/2024    8:25 AM 01/04/2023    8:07 AM 12/29/2021    8:13 AM 07/27/2020    9:24 AM  6CIT Screen  What Year? 0  points 0 points 0 points 0 points  What month? 0 points 0 points 0 points 0 points  What time? 0 points 0 points 0 points 0 points  Count back from 20 0 points 0 points 0 points 0 points  Months in reverse 0 points 0 points 2 points 0 points  Repeat phrase 0  points 2 points 2 points 0 points  Total Score 0 points 2 points 4 points 0 points    Immunizations Immunization History  Administered Date(s) Administered   Fluad Quad(high Dose 65+) 02/25/2019, 01/14/2021, 01/17/2022   Fluad Trivalent(High Dose 65+) 01/03/2023   INFLUENZA, HIGH DOSE SEASONAL PF 02/18/2015, 02/06/2017, 01/12/2018, 03/03/2020   Influenza, Seasonal, Injecte, Preservative Fre 02/08/2016   Influenza-Unspecified 02/08/2016, 03/02/2017   Janssen (J&J) SARS-COV-2 Vaccination 07/13/2019, 05/18/2020   PPD Test 03/02/2017   Pneumococcal Conjugate-13 01/19/2016   Pneumococcal Polysaccharide-23 09/30/2000, 02/28/2013   Tdap 05/02/2009    TDAP status: Due, Education has been provided regarding the importance of this vaccine. Advised may receive this vaccine at local pharmacy or Health Dept. Aware to provide a copy of the vaccination record if obtained from local pharmacy or Health Dept. Verbalized acceptance and understanding.  Flu Vaccine status: Due, Education has been provided regarding the importance of this vaccine. Advised may receive this vaccine at local pharmacy or Health Dept. Aware to provide a copy of the vaccination record if obtained from local pharmacy or Health Dept. Verbalized acceptance and understanding.  Pneumococcal vaccine status: Due, Education has been provided regarding the importance of this vaccine. Advised may receive this vaccine at local pharmacy or Health Dept. Aware to provide a copy of the vaccination record if obtained from local pharmacy or Health Dept. Verbalized acceptance and understanding.  Covid-19 vaccine status: Declined, Education has been provided regarding the importance of this vaccine but patient still declined. Advised may receive this vaccine at local pharmacy or Health Dept.or vaccine clinic. Aware to provide a copy of the vaccination record if obtained from local pharmacy or Health Dept. Verbalized acceptance and  understanding.  Qualifies for Shingles Vaccine? No   Zostavax completed No   Shingrix Completed?: No.    Education has been provided regarding the importance of this vaccine. Patient has been advised to call insurance company to determine out of pocket expense if they have not yet received this vaccine. Advised may also receive vaccine at local pharmacy or Health Dept. Verbalized acceptance and understanding.  Screening Tests Health Maintenance  Topic Date Due   Zoster Vaccines- Shingrix (1 of 2) Never done   DTaP/Tdap/Td (2 - Td or Tdap) 05/03/2019   Influenza Vaccine  12/01/2023   COVID-19 Vaccine (3 - 2025-26 season) 01/01/2024   HEMOGLOBIN A1C  05/18/2024   OPHTHALMOLOGY EXAM  10/12/2024   Diabetic kidney evaluation - eGFR measurement  11/15/2024   Diabetic kidney evaluation - Urine ACR  11/15/2024   FOOT EXAM  11/15/2024   Medicare Annual Wellness (AWV)  01/08/2025   Pneumococcal Vaccine: 50+ Years  Completed   HPV VACCINES  Aged Out   Meningococcal B Vaccine  Aged Out   Hepatitis C Screening  Discontinued    Health Maintenance  Health Maintenance Due  Topic Date Due   Zoster Vaccines- Shingrix (1 of 2) Never done   DTaP/Tdap/Td (2 - Td or Tdap) 05/03/2019   Influenza Vaccine  12/01/2023   COVID-19 Vaccine (3 - 2025-26 season) 01/01/2024    Colorectal cancer screening: No longer required.   Lung Cancer Screening: (Low Dose CT  Chest recommended if Age 44-80 years, 20 pack-year currently smoking OR have quit w/in 15years.) does not qualify.   Lung Cancer Screening Referral: n/a  Additional Screening:  Hepatitis C Screening: does not qualify; Completed 02/08/2021  Vision Screening: Recommended annual ophthalmology exams for early detection of glaucoma and other disorders of the eye. Is the patient up to date with their annual eye exam?  Yes  Who is the provider or what is the name of the office in which the patient attends annual eye exams? [facility].   If pt is not established with a provider, would they like to be referred to a provider to establish care? N/a.   Dental Screening: Recommended annual dental exams for proper oral hygiene  Diabetic Foot Exam: Diabetic Foot Exam: Completed 11/16/2023  Community Resource Referral / Chronic Care Management: CRR required this visit?  No   CCM required this visit?  No     Plan:     I have personally reviewed and noted the following in the patient's chart:   Medical and social history Use of alcohol, tobacco or illicit drugs  Current medications and supplements including opioid prescriptions. Patient is not currently taking opioid prescriptions. Functional ability and status Nutritional status Physical activity Advanced directives List of other physicians Hospitalizations # 0, surgeries # 1, and ER # 0, visits in previous 12 months Vitals Screenings to include cognitive, depression, and falls Referrals and appointments  In addition, I have reviewed and discussed with patient certain preventive protocols, quality metrics, and best practice recommendations. A written personalized care plan for preventive services as well as general preventive health recommendations were provided to patient.     Bonny Jon Mayor, CMA   01/09/2024   After Visit Summary: (MyChart) Due to this being a telephonic visit, the after visit summary with patients personalized plan was offered to patient via MyChart   Nurse Notes:   Ezreal Turay is a 83 y.o. male patient of Metheney, Dorothyann BIRCH, MD who had a Medicare Annual Wellness Visit today via telephone. Jonluke is Retired and lives with their spouse. He has 3 children. He reports that he is socially active and does interact with friends/family regularly. He is moderately physically active and enjoys playing golf three times a week.

## 2024-01-10 ENCOUNTER — Other Ambulatory Visit: Payer: Self-pay | Admitting: Family Medicine

## 2024-01-22 ENCOUNTER — Other Ambulatory Visit: Payer: Self-pay | Admitting: *Deleted

## 2024-01-22 DIAGNOSIS — Z7901 Long term (current) use of anticoagulants: Secondary | ICD-10-CM | POA: Diagnosis not present

## 2024-01-22 DIAGNOSIS — N210 Calculus in bladder: Secondary | ICD-10-CM | POA: Diagnosis not present

## 2024-01-22 DIAGNOSIS — N1339 Other hydronephrosis: Secondary | ICD-10-CM | POA: Diagnosis not present

## 2024-01-22 DIAGNOSIS — Z7984 Long term (current) use of oral hypoglycemic drugs: Secondary | ICD-10-CM | POA: Diagnosis not present

## 2024-01-22 DIAGNOSIS — Z87442 Personal history of urinary calculi: Secondary | ICD-10-CM | POA: Diagnosis not present

## 2024-01-22 DIAGNOSIS — I4891 Unspecified atrial fibrillation: Secondary | ICD-10-CM | POA: Diagnosis not present

## 2024-01-22 DIAGNOSIS — K8020 Calculus of gallbladder without cholecystitis without obstruction: Secondary | ICD-10-CM | POA: Diagnosis not present

## 2024-01-22 DIAGNOSIS — R109 Unspecified abdominal pain: Secondary | ICD-10-CM | POA: Diagnosis not present

## 2024-01-22 DIAGNOSIS — E119 Type 2 diabetes mellitus without complications: Secondary | ICD-10-CM | POA: Diagnosis not present

## 2024-01-22 DIAGNOSIS — Z794 Long term (current) use of insulin: Secondary | ICD-10-CM | POA: Diagnosis not present

## 2024-01-22 DIAGNOSIS — N390 Urinary tract infection, site not specified: Secondary | ICD-10-CM | POA: Diagnosis not present

## 2024-01-22 DIAGNOSIS — Z7985 Long-term (current) use of injectable non-insulin antidiabetic drugs: Secondary | ICD-10-CM | POA: Diagnosis not present

## 2024-01-22 DIAGNOSIS — N1330 Unspecified hydronephrosis: Secondary | ICD-10-CM | POA: Diagnosis not present

## 2024-01-22 DIAGNOSIS — N200 Calculus of kidney: Secondary | ICD-10-CM | POA: Diagnosis not present

## 2024-01-22 DIAGNOSIS — R3129 Other microscopic hematuria: Secondary | ICD-10-CM | POA: Diagnosis not present

## 2024-01-22 DIAGNOSIS — Z79899 Other long term (current) drug therapy: Secondary | ICD-10-CM | POA: Diagnosis not present

## 2024-01-22 DIAGNOSIS — M170 Bilateral primary osteoarthritis of knee: Secondary | ICD-10-CM

## 2024-01-22 MED ORDER — LOSARTAN POTASSIUM 25 MG PO TABS: 25.0000 mg | ORAL_TABLET | Freq: Every day | ORAL | 1 refills | Status: AC

## 2024-01-26 ENCOUNTER — Telehealth: Payer: Self-pay

## 2024-01-26 DIAGNOSIS — N210 Calculus in bladder: Secondary | ICD-10-CM

## 2024-01-26 NOTE — Telephone Encounter (Signed)
 Orders Placed This Encounter  Procedures  . Ambulatory referral to Urology    Referral Priority:   Routine    Referral Type:   Consultation    Referral Reason:   Specialty Services Required    Requested Specialty:   Urology    Number of Visits Requested:   1

## 2024-01-26 NOTE — Telephone Encounter (Signed)
 Patient came into office to see if he could get referral to an Urologist within [facility], patient states that he saw one with [facility] but would like to see one with [facility], please advise, thanks.

## 2024-01-28 ENCOUNTER — Other Ambulatory Visit: Payer: Self-pay | Admitting: Family Medicine

## 2024-01-28 DIAGNOSIS — Z794 Long term (current) use of insulin: Secondary | ICD-10-CM

## 2024-01-29 NOTE — Telephone Encounter (Signed)
 Pt notified through MyChart.

## 2024-01-30 ENCOUNTER — Other Ambulatory Visit: Payer: Self-pay | Admitting: Family Medicine

## 2024-01-30 DIAGNOSIS — I700 Atherosclerosis of aorta: Secondary | ICD-10-CM

## 2024-01-31 ENCOUNTER — Other Ambulatory Visit (INDEPENDENT_AMBULATORY_CARE_PROVIDER_SITE_OTHER): Payer: Self-pay

## 2024-01-31 ENCOUNTER — Ambulatory Visit

## 2024-01-31 VITALS — BP 124/60 | Ht 70.0 in | Wt 213.0 lb

## 2024-01-31 DIAGNOSIS — M1612 Unilateral primary osteoarthritis, left hip: Secondary | ICD-10-CM

## 2024-01-31 DIAGNOSIS — M25552 Pain in left hip: Secondary | ICD-10-CM

## 2024-01-31 MED ORDER — METHYLPREDNISOLONE ACETATE 40 MG/ML IJ SUSP
40.0000 mg | Freq: Once | INTRAMUSCULAR | Status: AC
  Administered 2024-01-31: 40 mg via INTRA_ARTICULAR

## 2024-01-31 NOTE — Progress Notes (Signed)
   Subjective:    Patient ID: Tristan Mayer, male    DOB: 83 y.o., 12-12-1940   MRN: [id number]  HPI  Chief Complaint: Left hip osteoarthritis  Patient is an 83 year old male with past medical history significant for atrial fibrillation, diabetes, OSA on CPAP.  Issue has been going on for approximately 6 months and is using a cane to minimize.  Has previously had cortisone shot in his knees which did not provide significant relief  Patient recently evaluated on 12/14/2023 by [facility].  12/14/2023 plain films: AP pelvis and left hip x-rays were performed and reviewed in the office today shows severe arthritis of the left hip with bone-on-bone with some superolateral wear.   Due to various reasons he is afraid of taking time off of work for a preplacement and at the time of his visit with [facility] he declined a hip injection.  Since that time he has reconsidered and is interested in this today.  Currently planning for follow-up in 8 months or so from now with Dr. Lavelle Ada planning a hip replacement at that time.    Objective:   Physical Exam There were no vitals filed for this visit.   Left hip (compared to normal) -Inspection: Antalgic gait.  No swelling or skin changes. No leg length discrepancy. No gait abnormalities with walking. -Palpation: TTP - level ASIS, - level AIIS, - adductor, + greater trochanter, - SI joint, - over piriformis -AROM/PROM: Flexion 85 deg, abduction 20 deg, adduction 15  deg, extension  10 deg, ER 25 deg, IR 10 deg, low hamstring flexibility -Strength: 5/5 flexion, 5/5 abduction, 5/5 adduction, 5/5 extension (though all these are limited 2/2 pain) -Special tests: +  FADIR, +  log roll, + Thomas, + FABER, - piriformis testing, - SLR, + hop test     Intra-articular Hip Injection with Ultrasound Guidance Procedure Note Tristan Mayer April 14, 1941 Indications: Pain Procedure Details Following the description of risks including infection,  bleeding, damage to surrounding structures, patient provided written consent for left hip joint injection procedure. US  was used to identify the femoroacetabular joint. Patient was sterilely prepped in the usual fashion with alcohol.  Following topical anesthetization with ethyl chloride they were injected with a solution of 40mg  Depo-medrol  and 3cc Mepivacaine 2% via the anterior approach. This was well visualized under ultrasound, please see associated photographic documentation. Patient tolerated well without complication.  Precautions provided. Cleaned and dressing applied.   Assessment & Plan:   Tristan Mayer is a 83 y.o. male presenting today with severe hip pain and x-rays obtained at outside facility showing severe femoral acetabular arthritis.  I feel that he has 2 sources of pain for his hip at this time including greater trochanteric pain syndrome and femoral acetabular arthritis.  Based on his imaging findings and the severity of his pain with range of motion of his hip suddenly today, I do feel that he would benefit a good amount from an intra-articular injection there.  We will pursue this today, but I recommend that if his pain relief is minimal/partial, that we will address his greater trochanter as a next step.

## 2024-02-02 ENCOUNTER — Other Ambulatory Visit: Payer: Self-pay | Admitting: Family Medicine

## 2024-02-02 DIAGNOSIS — E118 Type 2 diabetes mellitus with unspecified complications: Secondary | ICD-10-CM

## 2024-02-15 ENCOUNTER — Encounter: Payer: Self-pay | Admitting: Family Medicine

## 2024-02-15 ENCOUNTER — Ambulatory Visit: Admitting: Family Medicine

## 2024-02-15 VITALS — BP 122/51 | HR 58 | Ht 70.0 in | Wt 218.1 lb

## 2024-02-15 DIAGNOSIS — Z794 Long term (current) use of insulin: Secondary | ICD-10-CM

## 2024-02-15 DIAGNOSIS — I10 Essential (primary) hypertension: Secondary | ICD-10-CM | POA: Diagnosis not present

## 2024-02-15 DIAGNOSIS — E113211 Type 2 diabetes mellitus with mild nonproliferative diabetic retinopathy with macular edema, right eye: Secondary | ICD-10-CM

## 2024-02-15 DIAGNOSIS — Z23 Encounter for immunization: Secondary | ICD-10-CM | POA: Diagnosis not present

## 2024-02-15 DIAGNOSIS — E785 Hyperlipidemia, unspecified: Secondary | ICD-10-CM | POA: Diagnosis not present

## 2024-02-15 DIAGNOSIS — E1169 Type 2 diabetes mellitus with other specified complication: Secondary | ICD-10-CM | POA: Diagnosis not present

## 2024-02-15 DIAGNOSIS — I480 Paroxysmal atrial fibrillation: Secondary | ICD-10-CM

## 2024-02-15 DIAGNOSIS — E118 Type 2 diabetes mellitus with unspecified complications: Secondary | ICD-10-CM

## 2024-02-15 LAB — POCT GLYCOSYLATED HEMOGLOBIN (HGB A1C): Hemoglobin A1C: 6.3 % — AB (ref 4.0–5.6)

## 2024-02-15 MED ORDER — TRESIBA FLEXTOUCH 100 UNIT/ML ~~LOC~~ SOPN: PEN_INJECTOR | SUBCUTANEOUS | Status: DC

## 2024-02-15 MED ORDER — TIRZEPATIDE 10 MG/0.5ML ~~LOC~~ SOAJ: 10.0000 mg | SUBCUTANEOUS | 0 refills | Status: DC

## 2024-02-15 NOTE — Patient Instructions (Signed)
 Start the Mounjaro  10mg  tomorrow. May get some nausea the first week on new dose. Decrease Toujeo to 20 units starting tomorrow  After 2 weeks,  If sugars in AM are under 120, then ok to decrease Touejo to 15 units daily .

## 2024-02-15 NOTE — Assessment & Plan Note (Addendum)
 BP at great off of carvedilol .

## 2024-02-15 NOTE — Assessment & Plan Note (Signed)
 Continue Xarelto .  He says his refills are up-to-date.

## 2024-02-15 NOTE — Assessment & Plan Note (Signed)
 Type 2 diabetes mellitus managed with insulin  and GLP-1 agonist. A1c is 6.3, below surgical target. Discussed dietary modifications to manage blood glucose and reduce insulin  dependency. - Increase Mounjaro  to 10 mg weekly. - Reduce Toujeo to 20 units. - Monitor blood glucose levels with continuous glucose meter. - if after 2 weeks glucose under 120 dec Toujeo to 15 units.  - Encourage dietary modifications to manage blood glucose levels. - Aim to discontinue insulin  if blood glucose is well-managed with Mounjaro  and metformin .

## 2024-02-15 NOTE — Progress Notes (Signed)
 Established Patient Office Visit  Subjective  Patient ID: Tristan Mayer, male    DOB: 1940-05-30  Age: 83 y.o. MRN: [id number]  Chief Complaint  Patient presents with   Diabetes    HPI  Discussed the use of AI scribe software for clinical note transcription with the patient, who gave verbal consent to proceed.  History of Present Illness Tristan Mayer is an 83 year old male who presents for follow-up regarding hip replacement surgery.  Hip pain and functional status - Significant pain in the hip scheduled for replacement in January, uses a can - Cortisone injection provided relief for only two days - Currently using Tramadol  as needed for pain management - Able to play golf three times a week despite pain  Glycemic control and weight management - Diabetes managed with Toujeo insulin  45 units and Mounjaro  (missed for a couple of weeks) - Morning blood glucose levels around 80 mg/dL, with spikes up to 749 mg/dL depending on dietary intake - A1C currently 6.3 - Dietary efforts include increased protein and reduced sugar intake, but sometimes skips meals - Recent weight increase from 213 to 218 pounds  Immunization status and vaccine reactions - Received recent influenza vaccination - Considering tetanus vaccination prior to upcoming surgery - Previous adverse reaction to COVID vaccine - Hesitant about receiving shingles vaccine      ROS    Objective:     BP (!) 122/51   Pulse (!) 58   Ht 5' 10 (1.778 m)   Wt 218 lb 1.9 oz (98.9 kg)   SpO2 98%   BMI 31.30 kg/m    Physical Exam Vitals and nursing note reviewed.  Constitutional:      Appearance: Normal appearance.  HENT:     Head: Normocephalic and atraumatic.  Eyes:     Conjunctiva/sclera: Conjunctivae normal.  Cardiovascular:     Rate and Rhythm: Normal rate and regular rhythm.  Pulmonary:     Effort: Pulmonary effort is normal.     Comments: Coarse BS bilat Skin:    General: Skin is warm and  dry.  Neurological:     Mental Status: He is alert.  Psychiatric:        Mood and Affect: Mood normal.      Results for orders placed or performed in visit on 02/15/24  POCT HgB A1C  Result Value Ref Range   Hemoglobin A1C 6.3 (A) 4.0 - 5.6 %   HbA1c POC (<> result, manual entry)     HbA1c, POC (prediabetic range)     HbA1c, POC (controlled diabetic range)        The ASCVD Risk score (Arnett DK, et al., 2019) failed to calculate for the following reasons:   The 2019 ASCVD risk score is only valid for ages 32 to 59   Risk score cannot be calculated because patient has a medical history suggesting prior/existing ASCVD    Assessment & Plan:   Problem List Items Addressed This Visit       Cardiovascular and Mediastinum   Paroxysmal A-fib (HCC)   Continue Xarelto .  He says his refills are up-to-date.      Benign essential hypertension   BP at great off of carvedilol .         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      Relevant Medications  tirzepatide  (MOUNJARO ) 10 MG/0.5ML Pen   TRESIBA  FLEXTOUCH 100 UNIT/ML FlexTouch Pen   Hyperlipidemia associated with type 2 diabetes mellitus (HCC)   Type 2 diabetes mellitus managed with insulin  and GLP-1 agonist. A1c is 6.3, below surgical target. Discussed dietary modifications to manage blood glucose and reduce insulin  dependency. - Increase Mounjaro  to 10 mg weekly. - Reduce Toujeo to 20 units. - Monitor blood glucose levels with continuous glucose meter. - if after 2 weeks glucose under 120 dec Toujeo to 15 units.  - Encourage dietary modifications to manage blood glucose levels. - Aim to discontinue insulin  if blood glucose is well-managed with Mounjaro  and metformin .      Relevant Medications   tirzepatide  (MOUNJARO ) 10 MG/0.5ML Pen   TRESIBA  FLEXTOUCH 100 UNIT/ML  FlexTouch Pen   Other Visit Diagnoses       Controlled type 2 diabetes mellitus with complication, with long-term current use of insulin  (HCC)    -  Primary   Relevant Medications   tirzepatide  (MOUNJARO ) 10 MG/0.5ML Pen   TRESIBA  FLEXTOUCH 100 UNIT/ML FlexTouch Pen   Other Relevant Orders   POCT HgB A1C (Completed)     Encounter for immunization       Relevant Orders   Flu vaccine HIGH DOSE PF(Fluzone Trivalent) (Completed)       Assessment and Plan Assessment & Plan Severe right hip osteoarthritis, planned for total hip replacement Severe right hip osteoarthritis with significant pain. Cortisone injection provided temporary relief. Pain managed with Tramadol . - Continue Tramadol  as needed for pain management. - Plan for total hip replacement in January. - Advise updating tetanus vaccination before surgery.   General Health Maintenance Discussed updating tetanus vaccination before surgery. Offered COVID-19 and shingles vaccinations, both declined. - Advise updating tetanus vaccination before surgery. Can get at pharamcy since has Medicare  - Offer COVID-19 vaccination. Declined.  - Encourage shingles vaccination.   Return in about 3 months (around 05/17/2024) for Diabetes follow-up.    Tristan Byars, MD

## 2024-02-15 NOTE — Assessment & Plan Note (Signed)
 Yearly eye exam is up-to-date.

## 2024-02-16 DIAGNOSIS — I4891 Unspecified atrial fibrillation: Secondary | ICD-10-CM | POA: Diagnosis not present

## 2024-02-16 DIAGNOSIS — Z7984 Long term (current) use of oral hypoglycemic drugs: Secondary | ICD-10-CM | POA: Diagnosis not present

## 2024-02-16 DIAGNOSIS — Z9989 Dependence on other enabling machines and devices: Secondary | ICD-10-CM | POA: Diagnosis not present

## 2024-02-16 DIAGNOSIS — N210 Calculus in bladder: Secondary | ICD-10-CM | POA: Diagnosis not present

## 2024-02-16 DIAGNOSIS — Z7901 Long term (current) use of anticoagulants: Secondary | ICD-10-CM | POA: Diagnosis not present

## 2024-02-16 DIAGNOSIS — G4733 Obstructive sleep apnea (adult) (pediatric): Secondary | ICD-10-CM | POA: Diagnosis not present

## 2024-02-16 DIAGNOSIS — E119 Type 2 diabetes mellitus without complications: Secondary | ICD-10-CM | POA: Diagnosis not present

## 2024-02-16 DIAGNOSIS — Z794 Long term (current) use of insulin: Secondary | ICD-10-CM | POA: Diagnosis not present

## 2024-03-05 ENCOUNTER — Ambulatory Visit

## 2024-03-11 ENCOUNTER — Other Ambulatory Visit: Payer: Self-pay | Admitting: Family Medicine

## 2024-03-11 DIAGNOSIS — E118 Type 2 diabetes mellitus with unspecified complications: Secondary | ICD-10-CM

## 2024-04-02 ENCOUNTER — Other Ambulatory Visit: Payer: Self-pay | Admitting: Family Medicine

## 2024-04-26 ENCOUNTER — Other Ambulatory Visit: Payer: Self-pay | Admitting: Family Medicine

## 2024-04-26 DIAGNOSIS — E118 Type 2 diabetes mellitus with unspecified complications: Secondary | ICD-10-CM

## 2024-04-30 ENCOUNTER — Other Ambulatory Visit: Payer: Self-pay | Admitting: Family Medicine

## 2024-04-30 DIAGNOSIS — I700 Atherosclerosis of aorta: Secondary | ICD-10-CM

## 2024-05-17 ENCOUNTER — Telehealth: Payer: Self-pay | Admitting: Family Medicine

## 2024-05-17 ENCOUNTER — Other Ambulatory Visit: Payer: Self-pay | Admitting: Family Medicine

## 2024-05-17 DIAGNOSIS — E118 Type 2 diabetes mellitus with unspecified complications: Secondary | ICD-10-CM

## 2024-05-17 NOTE — Telephone Encounter (Signed)
 Copied from CRM [id number]. Topic: Referral - Request for Referral >> May 17, 2024  1:55 PM Willma R wrote: Did the patient discuss referral with their provider in the last year? Yes Needs ASAP, trying to schedule a surgery but cannot until he gets referrals.  Appointment offered? No  Type of order/referral and detailed reason for visit:  Orthopedic Cardiology   Preference of office, provider, location:  [facility] in [place] [address] Lavelle Ada [address], [place], [place] [address] [phone] Fax: [phone]  [facility] Card[facility] Dr Vinie Roulette or Dr Redell Montenegro [address] [place], [place] [address] [phone] Fax: [phone]  If referral order, have you been seen by this specialty before? Yes, needs new referral per [facility].  Can we respond through MyChart? No

## 2024-05-17 NOTE — Telephone Encounter (Signed)
 I do not mind referring but any the diagnosis codes.  Please clarify which patient and we can go ahead and send referrals per his preference.

## 2024-05-20 NOTE — Telephone Encounter (Signed)
 Spoke with patient - he states he does not think he needs anything from our office.  He states orthopaedic has recommended him for hip surgery but this would require a clearance from cardiology as he had last seen them in 2023. He is already scheduled with [facility] for 06/18/2024 for this.  He states he does have an upcoming appt with Dr. Alvan for tomorrow 05/21/2024/

## 2024-05-21 ENCOUNTER — Ambulatory Visit: Admitting: Family Medicine

## 2024-05-21 ENCOUNTER — Encounter: Payer: Self-pay | Admitting: Family Medicine

## 2024-05-21 VITALS — BP 96/38 | HR 60 | Ht 70.0 in | Wt 218.1 lb

## 2024-05-21 DIAGNOSIS — N400 Enlarged prostate without lower urinary tract symptoms: Secondary | ICD-10-CM

## 2024-05-21 DIAGNOSIS — E1169 Type 2 diabetes mellitus with other specified complication: Secondary | ICD-10-CM

## 2024-05-21 DIAGNOSIS — I480 Paroxysmal atrial fibrillation: Secondary | ICD-10-CM

## 2024-05-21 DIAGNOSIS — E1122 Type 2 diabetes mellitus with diabetic chronic kidney disease: Secondary | ICD-10-CM | POA: Diagnosis not present

## 2024-05-21 DIAGNOSIS — I959 Hypotension, unspecified: Secondary | ICD-10-CM

## 2024-05-21 DIAGNOSIS — Z794 Long term (current) use of insulin: Secondary | ICD-10-CM | POA: Diagnosis not present

## 2024-05-21 DIAGNOSIS — E785 Hyperlipidemia, unspecified: Secondary | ICD-10-CM | POA: Diagnosis not present

## 2024-05-21 DIAGNOSIS — N1832 Chronic kidney disease, stage 3b: Secondary | ICD-10-CM

## 2024-05-21 DIAGNOSIS — E113211 Type 2 diabetes mellitus with mild nonproliferative diabetic retinopathy with macular edema, right eye: Secondary | ICD-10-CM

## 2024-05-21 LAB — POCT GLYCOSYLATED HEMOGLOBIN (HGB A1C): Hemoglobin A1C: 7.5 % — AB (ref 4.0–5.6)

## 2024-05-21 NOTE — Patient Instructions (Addendum)
 Increase Tresiba  to 45 units and cut out all sweet beverages.   Please cut the losartan  in half and only take 1/2 tab daily.   Please cut the finasteride  in half and only take 1/2 tab daily.

## 2024-05-21 NOTE — Progress Notes (Signed)
 Pt stated that he went to p/u Mounjaro  over the weekend and the cost was $1000 so he didn't get it. He took his last shot 1/11. He stated that he is taking 50U of the Tresiba   Also pt stated that anytime that he sits down he wants to fall asleep. He said that he goes to bed around 830 PM and gets up between 5-7 AM. Most days its 5AM. He said that he does sleep well.

## 2024-05-21 NOTE — Assessment & Plan Note (Signed)
 Due for updated renal function evaluation today.  Continue low-dose losartan .  Though again were going to split that in half.

## 2024-05-21 NOTE — Progress Notes (Signed)
 "  Established Patient Office Visit  Patient ID: Tristan Mayer, male    DOB: February 10, 1941  Age: 84 y.o. MRN: [id number] PCP: Alvan Dorothyann BIRCH, MD  Chief Complaint  Patient presents with   Hypertension   Diabetes    Subjective:     HPI  Discussed the use of AI scribe software for clinical note transcription with the patient, who gave verbal consent to proceed.  History of Present Illness Tristan Mayer is an 84 year old male with diabetes who presents with concerns about medication costs and blood sugar management.  Hyperglycemia and glycemic variability - A1c increased to 7.5 from 6.3, potentially affecting surgical eligibility - Morning blood glucose levels consistently above 125 mg/dL, previously in the 19d-09d - Blood glucose readings have been erratic, sometimes reaching 300-400 mg/dL - Associates hyperglycemia with increased intake of sweets, carbohydrates during a recent cruise. Has been drinking  more sweetened iced tea  - Suspects artificial sweeteners may contribute to elevated glucose levels - Desires to reduce sugar intake and monitor blood glucose response  Antihyperglycemic medication management and cost concerns - Experiencing unexpectedly high out-of-pocket cost for Mounjaro , approximately $1,000 at the pharmacy - Uncertain if increased cost is due to insurance formulary changes or a new deductible - Unable to obtain Mounjaro , resulting in increased Tresiba  dose to 40 units daily - Continues metformin  therapy  Preoperative concerns for hip surgery - Preparing for hip surgery and concerned about associated costs - Concerned that elevated A1c may impact surgical eligibility  Blood glucose monitoring issues - Blood glucose readings have been consistently high since a change in testing supplies - Did not receive any of the recalled faulty test strip batches     ROS    Objective:     BP (!) 96/38   Pulse 60   Ht 5' 10 (1.778 m)   Wt 218 lb 1.3  oz (98.9 kg)   SpO2 95%   BMI 31.29 kg/m    Physical Exam Vitals and nursing note reviewed.  Constitutional:      Appearance: Normal appearance.  HENT:     Head: Normocephalic and atraumatic.  Eyes:     Conjunctiva/sclera: Conjunctivae normal.  Cardiovascular:     Rate and Rhythm: Normal rate and regular rhythm.  Pulmonary:     Effort: Pulmonary effort is normal.     Breath sounds: Normal breath sounds.  Skin:    General: Skin is warm and dry.  Neurological:     Mental Status: He is alert.  Psychiatric:        Mood and Affect: Mood normal.      Results for orders placed or performed in visit on 05/21/24  POCT HgB A1C  Result Value Ref Range   Hemoglobin A1C 7.5 (A) 4.0 - 5.6 %   HbA1c POC (<> result, manual entry)     HbA1c, POC (prediabetic range)     HbA1c, POC (controlled diabetic range)        The ASCVD Risk score (Arnett DK, et al., 2019) failed to calculate for the following reasons:   The 2019 ASCVD risk score is only valid for ages 109 to 51   Risk score cannot be calculated because patient has a medical history suggesting prior/existing ASCVD   * - Cholesterol units were assumed    Assessment & Plan:   Problem List Items Addressed This Visit       Cardiovascular and Mediastinum   Paroxysmal A-fib (HCC)   Currently on Xarelto .  Not on a beta-blocker as his heart rate is well-controlled.        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  Continue low-dose losartan .  Though again were going to split that in  half.      BPH (benign prostatic hyperplasia)   Currently on Flomax  and finasteride .  But because of low blood pressures I am going to have him split the finasteride  in half.      Other Visit Diagnoses       Hypotension, unspecified hypotension type       Relevant Orders   CMP14+EGFR   TSH       Assessment and Plan Assessment & Plan Type 2 diabetes mellitus with hyperglycemia A1c increased to 7.5 from 6.3, likely due to dietary changes. Elevated blood glucose levels persist. Insurance issues with Mounjaro  may require GLP-1 receptor agonist switch. - Encouraged him to [employer] for Mounjaro  coverage, explore alternatives if not covered. - Increased Tresiba  to 45 units until Mounjaro  issue resolved. - Continue metformin  as prescribed. - Avoid sweet beverages and artificial sweeteners for one week. - Monitor blood glucose and dietary intake. - Ordered blood work for kidney and liver function.  Hypotension-repeat blood pressure was a little better.  But I am going to have him go ahead and split his losartan  in half and take half a tab daily and his finasteride  in half and take half a tab daily to see if we can get that blood pressure up overall 100.  He is asymptomatic today but just seems a little sluggish to me.  Return in about 2 weeks (around 06/04/2024) for Nurse BP Check.    Dorothyann Byars, MD [facility] at [facility]   "

## 2024-05-21 NOTE — Assessment & Plan Note (Signed)
 Currently on Xarelto .  Not on a beta-blocker as his heart rate is well-controlled.

## 2024-05-21 NOTE — Assessment & Plan Note (Signed)
 Currently on Flomax  and finasteride .  But because of low blood pressures I am going to have him split the finasteride  in half.

## 2024-05-22 ENCOUNTER — Ambulatory Visit: Payer: Self-pay | Admitting: Family Medicine

## 2024-05-22 LAB — CMP14+EGFR
ALT: 17 IU/L (ref 0–44)
AST: 18 IU/L (ref 0–40)
Albumin: 3.5 g/dL — ABNORMAL LOW (ref 3.7–4.7)
Alkaline Phosphatase: 86 IU/L (ref 48–129)
BUN/Creatinine Ratio: 12 (ref 10–24)
BUN: 22 mg/dL (ref 8–27)
Bilirubin Total: 0.7 mg/dL (ref 0.0–1.2)
CO2: 20 mmol/L (ref 20–29)
Calcium: 9.1 mg/dL (ref 8.6–10.2)
Chloride: 102 mmol/L (ref 96–106)
Creatinine, Ser: 1.82 mg/dL — ABNORMAL HIGH (ref 0.76–1.27)
Globulin, Total: 2.6 g/dL (ref 1.5–4.5)
Glucose: 212 mg/dL — ABNORMAL HIGH (ref 70–99)
Potassium: 4.7 mmol/L (ref 3.5–5.2)
Sodium: 134 mmol/L (ref 134–144)
Total Protein: 6.1 g/dL (ref 6.0–8.5)
eGFR: 37 mL/min/1.73 — ABNORMAL LOW

## 2024-05-22 LAB — TSH: TSH: 2.44 u[IU]/mL (ref 0.450–4.500)

## 2024-05-22 NOTE — Progress Notes (Signed)
 Thyroid looks great!!! Your kidney function went up slightly so want to recheck in 6 weeks.  We can also get urine microalbumin as well.

## 2024-05-24 ENCOUNTER — Telehealth: Payer: Self-pay | Admitting: Family Medicine

## 2024-05-24 ENCOUNTER — Other Ambulatory Visit (HOSPITAL_COMMUNITY): Payer: Self-pay

## 2024-05-24 MED ORDER — TRULICITY 1.5 MG/0.5ML ~~LOC~~ SOAJ: 1.5000 mg | SUBCUTANEOUS | 0 refills | Status: AC

## 2024-05-24 NOTE — Telephone Encounter (Signed)
 Okay, we will see if they can cover Trulicity .  New prescription sent

## 2024-05-24 NOTE — Telephone Encounter (Signed)
 Copied from CRM #[id number]. Topic: General - Other >> May 24, 2024  1:15 PM Zebedee SAUNDERS wrote: Reason for CRM: Pt needs replacement for MOUNJARO  10 MG/0.5ML PenTRESIBA since it's expensive. Pt stated he received FLEXTOUCH 100 UNIT/ML FlexTouch Pen in it's place which he already takes. Pt would like a call back to [phone].

## 2024-05-24 NOTE — Progress Notes (Signed)
 Pharmacy Patient Advocate Encounter   Received notification from Physician's Office that prior authorization for MOUNJARO  is required/requested.   Insurance verification completed.   The patient is insured through [employer].   Per test claim: Refill too soon. PA is not needed at this time. Medication was filled 05/17/24. Next eligible fill date is 07/19/24.

## 2024-05-27 NOTE — Progress Notes (Signed)
 Attempted call to patient. Left a voice mail message requesting a return call.

## 2024-06-04 ENCOUNTER — Ambulatory Visit

## 2024-06-05 ENCOUNTER — Ambulatory Visit

## 2024-06-05 VITALS — BP 133/69 | HR 66 | Resp 18 | Ht 70.0 in

## 2024-06-05 DIAGNOSIS — I959 Hypotension, unspecified: Secondary | ICD-10-CM

## 2024-06-05 NOTE — Progress Notes (Signed)
" ° °  Subjective:    Patient ID: Tristan Mayer, male    DOB: November 07, 1940, 84 y.o.   MRN: [id number]  HPI  Patient is here for a 2 wk BP recheck. Patients BP has been running low. Last OV Dr. Alvan had him cut his losartan  to a half of tab 12.5 mg and finasteride  to 2.5 mg daily to see if we can get that blood pressure up overall 100. Denies CP, SOB, weakness, dizziness, medication issues, or vision changes.  Review of Systems     Objective:   Physical Exam        Assessment & Plan:   Patients first BP reading is 133/69. Spoke with Mrs. Tonya B. Who asked me to have patient schedule a 6 wk f/u with Dr. Alvan around 07/02/24 for DM f/u and labs. Dr. Alvan will make any necessary changes. To patients regimen and I will reach out to inform him. "

## 2024-06-06 NOTE — Telephone Encounter (Signed)
 This has been addressed.

## 2024-07-10 ENCOUNTER — Ambulatory Visit: Admitting: Family Medicine

## 2025-01-09 ENCOUNTER — Ambulatory Visit
